# Patient Record
Sex: Female | Born: 1970 | Hispanic: Yes | Marital: Married | Smoking: Never smoker
Health system: Southern US, Community
[De-identification: ages and names within clinical notes are randomized; demographics above are authoritative.]

## PROBLEM LIST (undated history)

## (undated) DIAGNOSIS — E0500 Thyrotoxicosis with diffuse goiter without thyrotoxic crisis or storm: Secondary | ICD-10-CM

## (undated) DIAGNOSIS — H468 Other optic neuritis: Secondary | ICD-10-CM

## (undated) DIAGNOSIS — M3500 Sicca syndrome, unspecified: Secondary | ICD-10-CM

## (undated) DIAGNOSIS — E079 Disorder of thyroid, unspecified: Secondary | ICD-10-CM

## (undated) DIAGNOSIS — B159 Hepatitis A without hepatic coma: Secondary | ICD-10-CM

## (undated) DIAGNOSIS — J302 Other seasonal allergic rhinitis: Secondary | ICD-10-CM

## (undated) DIAGNOSIS — H47099 Other disorders of optic nerve, not elsewhere classified, unspecified eye: Secondary | ICD-10-CM

## (undated) HISTORY — DX: Sjogren syndrome, unspecified: M35.00

## (undated) HISTORY — DX: Hepatitis a without hepatic coma: B15.9

## (undated) HISTORY — DX: Disorder of thyroid, unspecified: E07.9

## (undated) HISTORY — DX: Thyrotoxicosis with diffuse goiter without thyrotoxic crisis or storm: E05.00

## (undated) HISTORY — PX: EYE SURGERY: SHX253

## (undated) HISTORY — DX: Other seasonal allergic rhinitis: J30.2

## (undated) HISTORY — DX: Other disorders of optic nerve, not elsewhere classified, unspecified eye: H47.099

## (undated) HISTORY — DX: Other optic neuritis: H46.8

---

## 2009-02-18 ENCOUNTER — Ambulatory Visit: Payer: Self-pay | Admitting: Gynecology

## 2009-02-18 ENCOUNTER — Encounter: Payer: Self-pay | Admitting: Gynecology

## 2009-02-18 ENCOUNTER — Other Ambulatory Visit: Admission: RE | Admit: 2009-02-18 | Discharge: 2009-02-18 | Payer: Self-pay | Admitting: Gynecology

## 2009-02-25 ENCOUNTER — Ambulatory Visit (HOSPITAL_COMMUNITY): Admission: RE | Admit: 2009-02-25 | Discharge: 2009-02-25 | Payer: Self-pay | Admitting: Gynecology

## 2010-02-19 ENCOUNTER — Other Ambulatory Visit: Admission: RE | Admit: 2010-02-19 | Discharge: 2010-02-19 | Payer: Self-pay | Admitting: Gynecology

## 2010-02-19 ENCOUNTER — Ambulatory Visit: Payer: Self-pay | Admitting: Gynecology

## 2010-02-25 ENCOUNTER — Ambulatory Visit: Payer: Self-pay | Admitting: Gynecology

## 2010-03-05 ENCOUNTER — Encounter: Admission: RE | Admit: 2010-03-05 | Discharge: 2010-03-05 | Payer: Self-pay | Admitting: Gynecology

## 2010-03-16 ENCOUNTER — Encounter: Admission: RE | Admit: 2010-03-16 | Discharge: 2010-03-16 | Payer: Self-pay | Admitting: Gynecology

## 2011-02-10 ENCOUNTER — Encounter: Payer: Self-pay | Admitting: Gynecology

## 2013-10-22 ENCOUNTER — Encounter: Payer: Self-pay | Admitting: Internal Medicine

## 2013-12-30 ENCOUNTER — Ambulatory Visit: Payer: Self-pay | Admitting: Internal Medicine

## 2014-07-14 ENCOUNTER — Other Ambulatory Visit (HOSPITAL_COMMUNITY)
Admission: RE | Admit: 2014-07-14 | Discharge: 2014-07-14 | Disposition: A | Discharge status: Home / Self Care | Payer: 59 | Source: Ambulatory Visit | Attending: Gynecology | Admitting: Gynecology

## 2014-07-14 ENCOUNTER — Ambulatory Visit (INDEPENDENT_AMBULATORY_CARE_PROVIDER_SITE_OTHER): Payer: 59 | Admitting: Gynecology

## 2014-07-14 ENCOUNTER — Encounter: Payer: Self-pay | Admitting: Gynecology

## 2014-07-14 VITALS — BP 120/66 | Ht 58.5 in | Wt 143.8 lb

## 2014-07-14 DIAGNOSIS — N912 Amenorrhea, unspecified: Secondary | ICD-10-CM | POA: Diagnosis not present

## 2014-07-14 DIAGNOSIS — Z01419 Encounter for gynecological examination (general) (routine) without abnormal findings: Secondary | ICD-10-CM | POA: Insufficient documentation

## 2014-07-14 DIAGNOSIS — Z1151 Encounter for screening for human papillomavirus (HPV): Secondary | ICD-10-CM | POA: Insufficient documentation

## 2014-07-14 DIAGNOSIS — R682 Dry mouth, unspecified: Secondary | ICD-10-CM

## 2014-07-14 DIAGNOSIS — R635 Abnormal weight gain: Secondary | ICD-10-CM

## 2014-07-14 DIAGNOSIS — Z124 Encounter for screening for malignant neoplasm of cervix: Secondary | ICD-10-CM

## 2014-07-14 DIAGNOSIS — N951 Menopausal and female climacteric states: Secondary | ICD-10-CM | POA: Diagnosis not present

## 2014-07-14 LAB — PREGNANCY, URINE: Preg Test, Ur: NEGATIVE

## 2014-07-14 NOTE — Patient Instructions (Signed)
Perimenopausia (Perimenopause) La perimenopausia es el momento en que su cuerpo comienza a pasar a la menopausia (sin menstruacin durante 12 meses consecutivos). Es un proceso natural. La perimenopausia puede comenzar entre 2 y 8 aos antes de la menopausia y por lo general tiene una duracin de 1 ao ms pasada la menopausia. Durante este tiempo, los ovarios podran producir un vulo o no. Los ovarios varan su produccin de las hormonas estrgeno y progesterona cada mes. Esto puede causar perodos menstruales irregulares, dificultad para quedar embarazada, hemorragia vaginal entre perodos y sntomas incmodos. CAUSAS  Produccin irregular de las hormonas ovricas estrgeno y progesterona, y no ovular todos los meses.  Otras causas son:  Tumor de la glndula pituitaria.  Enfermedades que afectan los ovarios.  Radioterapia.  Quimioterapia.  Causas desconocidas.  Fumar mucho y abusar del consumo de alcohol puede llevar a que la perimenopausia aparezca antes. SIGNOS Y SNTOMAS   Acaloramiento.  Sudoracin nocturna.  Perodos menstruales irregulares.  Disminucin del deseo sexual.  Sequedad vaginal.  Dolores de cabeza.  Cambios en el estado de nimo.  Depresin.  Problemas de memoria.  Irritabilidad.  Cansancio.  Aumento de peso.  Problemas para quedar embarazada.  Prdida de clulas seas (osteoporosis).  Comienzo de endurecimiento de las arterias (aterosclerosis). DIAGNSTICO  El mdico realizar un diagnstico en funcin de su edad, historial de perodos menstruales y sntomas. Le realizarn un examen fsico para ver si hay algn cambio en su cuerpo, en especial en sus rganos reproductores. Las pruebas hormonales pueden ser o no tiles segn la cantidad de hormonas femeninas que produzca y cundo las produzca. Sin embargo, podrn realizarse otras pruebas hormonales para detectar otros problemas. TRATAMIENTO  En algunos casos, no se necesita tratamiento. La  decisin acerca de qu tratamiento es necesario durante la perimenopausia deber realizarse en conjunto con su mdico segn cmo estn afectando los sntomas a su estilo de vida. Existen varios tratamientos disponibles, como:  Tratar cada sntoma individual con medicamentos especficos para ese sntoma.  Algunos medicamentos herbales pueden ayudar en sntomas especficos.  Psicoterapia.  Terapia grupal. INSTRUCCIONES PARA EL CUIDADO EN EL HOGAR   Controle sus periodos menstruales (cundo ocurren, qu tan abundantes son, cunto tiempo pasa entre perodos, y cunto duran) como tambin sus sntomas y cundo comenzaron.  Tome slo medicamentos de venta libre o recetados, segn las indicaciones del mdico.  Duerma y descanse.  Haga actividad fsica.  Consuma una dieta que contenga calcio (bueno para los huesos) y productos derivados de la soja (actan como estrgenos).  No fume.  Evite las bebidas alcohlicas.  Tome los suplementos vitamnicos segn las indicaciones del mdico. En ciertos casos, puede ser de ayuda tomar vitamina E.  Tome suplementos de calcio y vitamina D para ayudar a prevenir la prdida sea.  En algunos casos la terapia de grupo podr ayudarla.  La acupuntura puede ser de ayuda en ciertos casos. SOLICITE ATENCIN MDICA SI:   Tiene preguntas acerca de sus sntomas.  Necesita ser derivada a un especialista (gineclogo, psiquiatra, o psiclogo). SOLICITE ATENCIN MDICA DE INMEDIATO SI:   Sufre una hemorragia vaginal abundante.  Su perodo menstrual dura ms de 8 das.  Sus perodos son recurrentes cada menos de 21 das.  Tiene hemorragias durante las relaciones sexuales.  Est muy deprimido.  Siente dolor al orinar.  Siente dolor de cabeza intenso.  Tiene problemas de visin. Document Released: 03/28/2005 Document Revised: 01/16/2013 ExitCare Patient Information 2015 [employer], [employer]. This information is not intended to replace advice given to you    by your health care provider. Make sure you discuss any questions you have with your health care provider.  

## 2014-07-14 NOTE — Progress Notes (Addendum)
Cynthia Fox 03/21/1971 [id number]   History:    44 y.o.  for annual gyn exam who has not been seen in the office since 2011. Patient stated that she had been getting [place] from [place] and stop the birth control pill back in November 2015 and has not had a menstrual cycle. Patient last year had a make a workup for midepigastric pains and informed that her liver function tests were elevated and which she return for additional testing she stated that they informed her that everything was fine. She states that when she was younger she had hepatitis. She has been complaining of a dry mouth and sensitive to certain foods when she eats. She denies any past history of any abnormal Pap smear. She's had mild vasomotor symptoms.  Past medical history,surgical history, family history and social history were all reviewed and documented in the EPIC chart.  Gynecologic History No LMP recorded (approximate). Patient is not currently having periods (Reason: Other). Contraception: none Last Pap: 2011. Results were: normal Last mammogram: 2011. Results were: normal  Obstetric History OB History  Gravida Para Term Preterm AB SAB TAB Ectopic Multiple Living  0 0 0 0 0 0 0 0 0 0          ROS: A ROS was performed and pertinent positives and negatives are included in the history.  GENERAL: No fevers or chills. HEENT: No change in vision, no earache, sore throat or sinus congestion. NECK: No pain or stiffness. CARDIOVASCULAR: No chest pain or pressure. No palpitations. PULMONARY: No shortness of breath, cough or wheeze. GASTROINTESTINAL: No abdominal pain, nausea, vomiting or diarrhea, melena or bright red blood per rectum. GENITOURINARY: No urinary frequency, urgency, hesitancy or dysuria. MUSCULOSKELETAL: No joint or muscle pain, no back pain, no recent trauma. DERMATOLOGIC: No rash, no itching, no lesions. ENDOCRINE: No polyuria, polydipsia, no heat or cold intolerance. No recent change in  weight. HEMATOLOGICAL: No anemia or easy bruising or bleeding. NEUROLOGIC: No headache, seizures, numbness, tingling or weakness. PSYCHIATRIC: No depression, no loss of interest in normal activity or change in sleep pattern.     Exam: chaperone present  BP 120/66 mmHg  Ht 4' 10.5" (1.486 m)  Wt 143 lb 12.8 oz (65.227 kg)  BMI 29.54 kg/m2  LMP  (Approximate)  Body mass index is 29.54 kg/(m^2).  General appearance : Well developed well nourished female. No acute distress HEENT: Eyes: no retinal hemorrhage or exudates,  Neck supple, trachea midline, no carotid bruits, no thyroidmegaly Lungs: Clear to auscultation, no rhonchi or wheezes, or rib retractions  Heart: Regular rate and rhythm, no murmurs or gallops Breast:Examined in sitting and supine position were symmetrical in appearance, no palpable masses or tenderness,  no skin retraction, no nipple inversion, no nipple discharge, no skin discoloration, no axillary or supraclavicular lymphadenopathy Abdomen: no palpable masses or tenderness, no rebound or guarding Extremities: no edema or skin discoloration or tenderness  Pelvic:  Bartholin, Urethra, Skene Glands: Within normal limits             Vagina: No gross lesions or discharge  Cervix: No gross lesions or discharge  Uterus  anteverted, normal size, shape and consistency, non-tender and mobile  Adnexa  Without masses or tenderness  Anus and perineum  normal   Rectovaginal  normal sphincter tone without palpated masses or tenderness             Hemoccult not indicated   Urine pregnancy test negative   Assessment/Plan:  44 y.o. female for annual exam complaining of dry mouth. Minimal joint discomfort. We are going to check and anti-nuclear antibody and if elevated we'll refer her to a rheumatologist in the event of underlying collagen vascular disease. She will return tomorrow in a fasting state TAB the following screening lab tests: Comprehensive metabolic panel, fasting lipid  profile, TSH, CBC, and urinalysis. We are also going to check an [facility] because she has not had a menstrual cycle since November to rule out menopause and also will check a prolactin level because of her amenorrhea as well to complete evaluation. A Pap smear with HPV screening was done today. Patient was provided with a requisition to schedule a mammogram. She will return back next week to discuss the results and plan a course of management.   Ok Edwards MD, 4:53 PM 07/14/2014

## 2014-07-15 LAB — URINALYSIS W MICROSCOPIC + REFLEX CULTURE
BILIRUBIN URINE: NEGATIVE
CRYSTALS: NONE SEEN
Casts: NONE SEEN
Glucose, UA: NEGATIVE mg/dL
HGB URINE DIPSTICK: NEGATIVE
KETONES UR: NEGATIVE mg/dL
Nitrite: NEGATIVE
PROTEIN: NEGATIVE mg/dL
Squamous Epithelial / LPF: NONE SEEN
UROBILINOGEN UA: 0.2 mg/dL (ref 0.0–1.0)
pH: 6 (ref 5.0–8.0)

## 2014-07-16 ENCOUNTER — Other Ambulatory Visit: Payer: 59

## 2014-07-16 LAB — CBC WITH DIFFERENTIAL/PLATELET
Basophils Absolute: 0 K/uL (ref 0.0–0.1)
Basophils Relative: 0 % (ref 0–1)
Eosinophils Absolute: 0.3 K/uL (ref 0.0–0.7)
Eosinophils Relative: 4 % (ref 0–5)
HCT: 40.1 % (ref 36.0–46.0)
Hemoglobin: 13.4 g/dL (ref 12.0–15.0)
Lymphocytes Relative: 20 % (ref 12–46)
Lymphs Abs: 1.4 K/uL (ref 0.7–4.0)
MCH: 33.4 pg (ref 26.0–34.0)
MCHC: 33.4 g/dL (ref 30.0–36.0)
MCV: 100 fL (ref 78.0–100.0)
MPV: 11.1 fL (ref 8.6–12.4)
Monocytes Absolute: 0.5 K/uL (ref 0.1–1.0)
Monocytes Relative: 7 % (ref 3–12)
Neutro Abs: 4.8 K/uL (ref 1.7–7.7)
Neutrophils Relative %: 69 % (ref 43–77)
Platelets: 308 K/uL (ref 150–400)
RBC: 4.01 MIL/uL (ref 3.87–5.11)
RDW: 13.2 % (ref 11.5–15.5)
WBC: 7 K/uL (ref 4.0–10.5)

## 2014-07-16 LAB — LIPID PANEL
Cholesterol: 169 mg/dL (ref 0–200)
HDL: 48 mg/dL
LDL Cholesterol: 106 mg/dL — ABNORMAL HIGH (ref 0–99)
Total CHOL/HDL Ratio: 3.5 ratio
Triglycerides: 75 mg/dL
VLDL: 15 mg/dL (ref 0–40)

## 2014-07-16 LAB — COMPREHENSIVE METABOLIC PANEL
ALBUMIN: 4 g/dL (ref 3.5–5.2)
ALT: 28 U/L (ref 0–35)
AST: 26 U/L (ref 0–37)
Alkaline Phosphatase: 111 U/L (ref 39–117)
BUN: 13 mg/dL (ref 6–23)
CO2: 24 meq/L (ref 19–32)
Calcium: 8.9 mg/dL (ref 8.4–10.5)
Chloride: 102 mEq/L (ref 96–112)
Creat: 0.48 mg/dL — ABNORMAL LOW (ref 0.50–1.10)
GLUCOSE: 92 mg/dL (ref 70–99)
POTASSIUM: 4.1 meq/L (ref 3.5–5.3)
SODIUM: 135 meq/L (ref 135–145)
TOTAL PROTEIN: 7.3 g/dL (ref 6.0–8.3)
Total Bilirubin: 0.7 mg/dL (ref 0.2–1.2)

## 2014-07-16 LAB — URINE CULTURE

## 2014-07-16 LAB — TSH: TSH: 0.009 u[IU]/mL — ABNORMAL LOW (ref 0.350–4.500)

## 2014-07-16 LAB — CYTOLOGY - PAP

## 2014-07-17 LAB — FOLLICLE STIMULATING HORMONE: FSH: 3 m[IU]/mL

## 2014-07-17 LAB — PROLACTIN: PROLACTIN: 40.3 ng/mL

## 2014-07-21 ENCOUNTER — Other Ambulatory Visit: Payer: Self-pay | Admitting: Gynecology

## 2014-07-21 DIAGNOSIS — R7989 Other specified abnormal findings of blood chemistry: Secondary | ICD-10-CM

## 2014-07-21 LAB — ANA: ANA: POSITIVE — AB

## 2014-07-21 LAB — ANTI-NUCLEAR AB-TITER (ANA TITER): ANA Titer 1: 1:160 {titer} — ABNORMAL HIGH

## 2014-07-22 ENCOUNTER — Other Ambulatory Visit: Payer: 59

## 2014-07-22 ENCOUNTER — Telehealth: Payer: Self-pay | Admitting: *Deleted

## 2014-07-22 ENCOUNTER — Other Ambulatory Visit: Payer: Self-pay | Admitting: Gynecology

## 2014-07-22 DIAGNOSIS — E229 Hyperfunction of pituitary gland, unspecified: Principal | ICD-10-CM

## 2014-07-22 DIAGNOSIS — R7989 Other specified abnormal findings of blood chemistry: Secondary | ICD-10-CM

## 2014-07-22 NOTE — Telephone Encounter (Signed)
Referral faxed to [facility] they will contact me with time and date

## 2014-07-22 NOTE — Telephone Encounter (Signed)
-----   Message from Keenan BachelorKatherine R Annas, ArizonaRMA sent at 07/21/2014 12:30 PM EDT ----- Regarding: Rheumatolgy referral Per Dr. Glenetta HewJF "Also her ANA was elevated and we need to make an appointment for her with Dr. Corliss Skainseveshwar rheumatologist because of her symptoms for further evaluation."  Patient informed. She knows it will be sometime this week you will send referral.

## 2014-07-23 ENCOUNTER — Encounter: Payer: Self-pay | Admitting: Gynecology

## 2014-07-23 ENCOUNTER — Ambulatory Visit (INDEPENDENT_AMBULATORY_CARE_PROVIDER_SITE_OTHER): Payer: 59 | Admitting: Gynecology

## 2014-07-23 VITALS — BP 126/78

## 2014-07-23 DIAGNOSIS — R768 Other specified abnormal immunological findings in serum: Secondary | ICD-10-CM | POA: Diagnosis not present

## 2014-07-23 DIAGNOSIS — N915 Oligomenorrhea, unspecified: Secondary | ICD-10-CM

## 2014-07-23 DIAGNOSIS — E0590 Thyrotoxicosis, unspecified without thyrotoxic crisis or storm: Secondary | ICD-10-CM | POA: Diagnosis not present

## 2014-07-23 LAB — THYROID PANEL WITH TSH
FREE THYROXINE INDEX: 2.5 (ref 1.4–3.8)
T3 UPTAKE: 28 % (ref 22–35)
T4, Total: 8.8 ug/dL (ref 4.5–12.0)
TSH: 0.009 u[IU]/mL — AB (ref 0.350–4.500)

## 2014-07-23 LAB — PROLACTIN: PROLACTIN: 39.9 ng/mL

## 2014-07-23 MED ORDER — LEVONORGESTREL-ETHINYL ESTRAD 0.1-20 MG-MCG PO TABS: 1.0000 | ORAL_TABLET | Freq: Every day | ORAL | Status: DC

## 2014-07-23 NOTE — Patient Instructions (Addendum)
Hipertiroidismo (Hyperthyroidism) La tiroides es una glndula grande ubicada en la parte anterior e inferior del cuello. La tiroides interviene [employer] control del metabolismo. El metabolismo es el modo en que el organismo utiliza los alimentos. El control del metabolismo se realiza a travs de una hormona denominada tiroxina. Cuando la tiroides es hiperactiva, produce demasiada hormona. Cuando esto ocurre pueden surgir los siguientes problemas:   Nerviosismo  Intolerancia al calor  Prdida de peso (a pesar del aumento de la ingesta de comida)  Diarrea  Cambios en la textura del cabello o de la piel  Palpitaciones (falta de algunos latidos cardacos o latidos extra)  Taquicardia (frecuencia cardaca acelerada)  Falta de menstruacin (amenorrea)  Temblor en las manos CAUSAS  Enfermedad de [place] (el sistema inmune ataca a la glndula tiroides). sta es la causa ms frecuente.  Inflamacin de la glndula tiroides.  Tumor (generalmente benigno) de la glndula tiroides o Scientist, water quality.  Uso excesivo de medicamentos para la tiroides (tanto prescriptos como 'naturales')  Ingesta excesiva de iodo. DIAGNSTICO Para confirmar el hipertiroidismo, el profesional que lo asiste le solicitar anlisis de sangre y estudios con [place]. Algunas veces los signos estn ocultos. Puede ser [employer] profesional controle la enfermedad con exmenes de [place], ya sea antes o despus del diagnstico y [place]. TRATAMIENTO Tratamiento a Web designer varios tratamientos para Risk manager. Los medicamentos beta bloqueantes podrn proporcionar cierto alivio. Los medicamentos que disminuyen la produccin de hormonas proporcionarn a Neurosurgeon temporal Estas medidas en general no ofrecen Architect. Tratamiento definitivo  Se dispone de varios tratamientos que el profesional que lo asiste podr Agricultural engineer con usted y que tratarn el problema de  [place]. Estos tratamientos varan desde la ciruga (extirpacin de la tiroides) o el uso de yodo radiactivo (que destruye la tiroides por radiacin), hasta el uso de medicamentos antitiroideos (que interfieren en la sntesis de la hormona). Los dos primeros tratamientos son permanentes y Soil scientist. Habitualmente requieren el suministro de hormona de por vida. Esto es debido a que es imposible retirar o destruir la cantidad Secondary school teacher de tiroides que se necesita para que la persona quede eutiroidea (normal). INSTRUCCIONES PARA EL CUIDADO DOMICILIARIO Consulte con el profesional que lo asiste si el problema por el que lo trata empeora. Algunos ejemplos seran los trastornos ya mencionados. SOLICITE ATENCIN MDICA SI: El trastorno general empeora. EST SEGURO QUE:   Comprende las instrucciones para el alta mdica.  Controlar su enfermedad.  Solicitar atencin mdica de inmediato segn las indicaciones. Document Released: 03/28/2005 Document Revised: 06/20/2011 [facility] Patient Information 2015 [place], [place]. This information is not intended to replace advice given to you by your health care provider. Make sure you discuss any questions you have with your health care provider. AAN, anlisis de anticuerpos antinucleares (ANA, Antinuclear Antibody Test) Este anlisis sirve para diagnosticar el lupus eritematoso sistmico (LES) y el lupus inducido por frmacos y para descartar otras enfermedades similares (autoinmunes). El anlisis de AAN detecta anticuerpos antinucleares (AAN) en la sangre.  A veces, el sistema inmunolgico no funciona adecuadamente y produce sustancias que atacan las clulas del mismo organismo en lugar de atacar las sustancias extraas. Estas sustancias, denominadas autoanticuerpos, son producidas por Copywriter, advertising inmunolgico. Este es el sistema de defensa del organismo contra la invasin de las sustancias extraas, como los virus y las bacterias. Cuando esto  sucede, puede provocar una enfermedad que se llama enfermedad autoinmune (inmunidad contra s mismo). La presencia de AAN es  un marcador de un proceso autoinmune y est asociada con varias enfermedades de este tipo, pero lo ms frecuente es su relacin con el lupus eritematoso sistmico (LES). PREPARACIN PARA EL ESTUDIO No se requiere una preparacin especial ni ayunar. Le tomarn una muestra de [place] de una vena con Marella Bileuna aguja. HALLAZGOS NORMALES  Adultos: Negativo en una dilucin 1:40. Los rangos para los resultados normales pueden variar entre diferentes laboratorios y hospitales. Consulte siempre con su mdico despus de Production assistant, radiohacer el estudio para Artistconocer el significado de los [place]resultados y si los valores se consideran "dentro de los lmites normales". SIGNIFICADO DEL ESTUDIO  El mdico leer los resultados y Heritage managerhablar con usted sobre la importancia y el significado de los [place]resultados, as como las opciones de tratamiento y la necesidad de Education officer, environmentalrealizar pruebas adicionales, si fuera necesario. OBTENCIN DE LOS RESULTADOS DE LAS PRUEBAS Es su responsabilidad retirar el resultado del Vanderbiltestudio. Consulte en el laboratorio cundo y cmo podr Starbucks Corporationobtener los resultados. Document Released: 01/16/2013 [facility] Patient Information 2015 [place], [place]. This information is not intended to replace advice given to you by your health care provider. Make sure you discuss any questions you have with your health care provider.

## 2014-07-23 NOTE — Progress Notes (Signed)
   Patient presented to the office today to discuss her lab results. She was seen in the office for her annual exam on 07/14/2014 she had not been seen the office prior to that since 2011.Patient stated that she had been getting her oral contraceptive pills from [place] and stop the birth control pill back in November 2015 and has not had a menstrual cycle until a few days ago which she's had very light spotting. She had informed me that last year she had a workup as a result of midepigastric pains that she was having and was told that she had elevated liver function tests and when she returned to have additional testing the liver function tests were back to normal. She also had informed me that when she was young she had hepatitis A. She was also complaining of dry mouth and sensitivity to certain foods when she eats. She denied any photosensitivity or any malar rash or joint pains. She has been using barrier contraception at times since November of last year.  I had discussed with her that possibly she could've been in the perimenopausal phase of her life or thyroid dysfunction and or collagen vascular disease. So the following blood tests were ordered:  Comprehensive metabolic panel: Normal with exception slightly decreased creatinine Lipid profile: Normal parameters except LDL slightly elevated at 106 CBC: Within normal limits FSH: Normal Prolactin: Elevated at 40.3 on repeat one week later 39.9 TSH: Low with a value of 0.009 and on repeat 0.009 with normal T4, T3 and FTI ANA: Positive with 1 in 160 titer  Exam: Blood pressure 126/78   pulse 72 Neck: No thyromegaly no carotid bruits no palpable masses  Assessment/plan #1 Patient with apparent subclinical hyperthyroidism which indirectly may be contributing to her hyperprolactinemia. Patient with no galactorrhea no unusual headaches or any visual disturbances. We'll referred to endocrinologist for assistance in management. A thyroid scan will be  ordered before the appointment with endocrinologist #2 she will later be referred to the rheumatologist as a result of her elevated ANA and dry mouth to rule out collagen vascular disease. #3 patient was instructed to use barrier contraception and to return next week for placement of a nonhormonal IUD. Because of her past history of hepatitis A and last year elevated liver function tests I would recommend she not be place on any hormone-containing products at this time. All the above instructions were provided in Spanish greater than 50% of the time was spent in counseling and correlation of care for this patient proximal length 30 minutes.

## 2014-07-24 ENCOUNTER — Telehealth: Payer: Self-pay | Admitting: Gynecology

## 2014-07-24 ENCOUNTER — Telehealth: Payer: Self-pay | Admitting: *Deleted

## 2014-07-24 DIAGNOSIS — E0590 Thyrotoxicosis, unspecified without thyrotoxic crisis or storm: Secondary | ICD-10-CM

## 2014-07-24 NOTE — Telephone Encounter (Signed)
07/24/14- I LM VM for pt that [facility] the Paraguard IUD and insertion at 100% for contraception.wl

## 2014-07-24 NOTE — Telephone Encounter (Addendum)
Appointment for scan is on 08/07/14 and 08/08/14 [facility] at 1st floor radiology at [address]. Pt informed with this. Dr.Gherge office will call to schedule the consult visit. Pt aware of this as well. Per JF other staff message "Victorino DikeJennifer on the thyroid scan ordered please call them and tell them we need an "uptake and Scan" of thyroid gland. Thanks JF " this is the exam scheduled.

## 2014-07-24 NOTE — Telephone Encounter (Signed)
-----   Message from Ok EdwardsJuan H Fernandez, MD sent at 07/23/2014  4:09 PM EDT ----- Cynthia DikeJennifer please make schedule thyroid ultrasound and a consult appointment with Dr. Lafe GarinGherge Endocrinologist at [facility]. Diagnosis: Subclinical hyperthyroidism

## 2014-08-05 ENCOUNTER — Encounter: Payer: Self-pay | Admitting: Gynecology

## 2014-08-05 NOTE — Telephone Encounter (Signed)
I spoke with Cynthia Fox regarding this referral and was told to re-fax this this was done on 08/04/14

## 2014-08-07 ENCOUNTER — Encounter (HOSPITAL_COMMUNITY)
Admission: RE | Admit: 2014-08-07 | Discharge: 2014-08-07 | Disposition: A | Discharge status: Home / Self Care | Payer: 59 | Source: Ambulatory Visit | Attending: Gynecology | Admitting: Gynecology

## 2014-08-07 DIAGNOSIS — E0590 Thyrotoxicosis, unspecified without thyrotoxic crisis or storm: Secondary | ICD-10-CM

## 2014-08-07 MED ORDER — SODIUM IODIDE I 131 CAPSULE
12.0000 | Freq: Once | INTRAVENOUS | Status: AC | PRN
  Administered 2014-08-07: 12 via ORAL

## 2014-08-08 ENCOUNTER — Encounter: Payer: Self-pay | Admitting: Endocrinology

## 2014-08-08 ENCOUNTER — Encounter (HOSPITAL_COMMUNITY)
Admission: RE | Admit: 2014-08-08 | Discharge: 2014-08-08 | Disposition: A | Discharge status: Home / Self Care | Payer: 59 | Source: Ambulatory Visit | Attending: Gynecology | Admitting: Gynecology

## 2014-08-08 DIAGNOSIS — E0590 Thyrotoxicosis, unspecified without thyrotoxic crisis or storm: Secondary | ICD-10-CM | POA: Diagnosis not present

## 2014-08-08 MED ORDER — SODIUM IODIDE I 131 CAPSULE: 0.1500 | Freq: Once | INTRAVENOUS | Status: AC | PRN

## 2014-08-12 ENCOUNTER — Telehealth: Payer: Self-pay | Admitting: *Deleted

## 2014-08-12 DIAGNOSIS — E0590 Thyrotoxicosis, unspecified without thyrotoxic crisis or storm: Secondary | ICD-10-CM

## 2014-08-12 NOTE — Telephone Encounter (Signed)
Referral was note placed for the below, referral placed now, they will contact pt to schedule.

## 2014-08-12 NOTE — Telephone Encounter (Signed)
Spoke with Burna MortimerWanda today and the MD has not yet reviewed pt notes yet. Will check back next week, if MD will see the patient they will contact her and me with time and date, will check back next week

## 2014-08-12 NOTE — Telephone Encounter (Signed)
-----   Message from Keenan BachelorKatherine R Annas, ArizonaRMA sent at 08/08/2014  3:28 PM EDT ----- Regarding: referral needs to be put in queue Dr. Glenetta HewJF received thyroid scan results and sent me note to be sure she has appointment with Dr. Lafe GarinGherge. I saw your 07/24/14 note and it said Dr. Lavella HammockGherge's office will call her with consult. I called her office but they did not know about it. They said she will need to put referral request in the queue for them and then they will contact patient to schedule. Thank you!!!

## 2014-08-13 NOTE — Telephone Encounter (Signed)
Appointment 09/11/14 @ 1:15pm

## 2014-08-18 NOTE — Telephone Encounter (Signed)
Pt had appointment 08/15/14

## 2014-09-11 ENCOUNTER — Ambulatory Visit (INDEPENDENT_AMBULATORY_CARE_PROVIDER_SITE_OTHER): Payer: 59 | Admitting: Internal Medicine

## 2014-09-11 ENCOUNTER — Encounter: Payer: Self-pay | Admitting: Internal Medicine

## 2014-09-11 VITALS — BP 106/68 | HR 84 | Temp 98.3°F | Resp 12 | Ht 59.0 in | Wt 149.0 lb

## 2014-09-11 DIAGNOSIS — E0590 Thyrotoxicosis, unspecified without thyrotoxic crisis or storm: Secondary | ICD-10-CM | POA: Diagnosis not present

## 2014-09-11 DIAGNOSIS — E221 Hyperprolactinemia: Secondary | ICD-10-CM

## 2014-09-11 NOTE — Progress Notes (Addendum)
Patient ID: Cynthia Fox, female   DOB: 01-12-1971, 44 y.o.   MRN: [id number]   HPI  Cynthia Fox is a 44 y.o.-year-old female, referred by Dr Lily Peer, for management of low TSH and elevated prolactin.  She was found to have a low TSH in 07/2014 by Dr Lily Peer.  I reviewed pt's thyroid tests: Lab Results  Component Value Date   TSH 0.009* 07/22/2014   TSH 0.009* 07/16/2014    Latest thyroid profile: Component     Latest Ref Rng 07/22/2014  T4, Total     4.5 - 12.0 ug/dL 8.8  T3 Uptake     22 - 35 % 28  Free Thyroxine Index     1.4 - 3.8 2.5  TSH     0.350 - 4.500 uIU/mL 0.009 (L)   Pt had a thyroid uptake in 08/08/2014 (unfortunately, a scan was not done at that time) and this returned at ULN, 28.4% (10-30%).  Pt denies feeling nodules in neck, hoarseness, dysphagia/odynophagia, SOB with lying down.  She c/o: - + dry mouth and dry eyes (is under investigation for Sjogren sd.)  - no fatigue - no excessive sweating/heat intolerance now, but had night sweats during the last winter - no tremors - no anxiety - + occasional, short-lived, palpitations - no hyperdefecation - + weight loss (13 lbs last year); + weight gain (gained 10 lbs this year) - no hair loss - + itching and dry skin lateral forearms and thighs - + irreg menses: skipped 5 months  Pt does have a FH of thyroid ds.: mother, sisters, aunts, cousins No FH of thyroid cancer. No h/o radiation tx to head or neck.  No seaweed or kelp, no recent contrast studies. No steroid use. No herbal supplements. No Biotin use.  Pt also has an elevated prolactin (normal: 2.8-29.2): Component     Latest Ref Rng 07/16/2014 07/22/2014  FSH      3.0   Prolactin      40.3 39.9   No milk discharge. No breast fulness. + HAs, less than before. No vision pbs.   I reviewed her chart and she also has a history of vit D deficiency. She had elevated LFTs in the past, resolved.   ROS: Constitutional: + weight gain, no fatigue, no  subjective hyperthermia/hypothermia Eyes: no blurry vision, no xerophthalmia ENT: no sore throat, no nodules palpated in throat, no dysphagia/odynophagia, no hoarseness Cardiovascular: no CP/SOB/palpitations/+ leg swelling Respiratory: no cough/SOB Gastrointestinal: no N/V/D/C Musculoskeletal: no muscle/joint aches Skin: no rashes, + itching, dry mouth Neurological: no tremors/numbness/tingling/dizziness, + HA Psychiatric: no depression/anxiety + Low libido  Past Medical History  Diagnosis Date  . Hepatitis A   . GERD (gastroesophageal reflux disease)   . Oral candidiasis   . Seasonal allergies    No past surgical history on file.   History   Social History  . Marital Status: Married    Spouse Name: N/A  . 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 Smoking status: Never Smoker   . Smokeless tobacco: Never Used  . Alcohol Use: Yes     Comment: weekends   Current Outpatient Prescriptions on File Prior to Visit  Medication Sig Dispense Refill  . Cetirizine HCl 10 MG CAPS Take by mouth.    . levonorgestrel-ethinyl estradiol (AVIANE,ALESSE,LESSINA) 0.1-20 MG-MCG tablet Take 1 tablet by mouth daily. (Patient not taking: Reported on 09/11/2014) 1 Package 11   No  current facility-administered medications on file prior to visit.   No Known Allergies   Family History  Problem Relation Age of Onset  . Hypertension Mother   . Diabetes Father   - also see history of present illness  PE: BP 106/68 mmHg  Pulse 84  Temp(Src) 98.3 F (36.8 C) (Oral)  Resp 12  Ht 4\' 11"  (1.499 m)  Wt 149 lb (67.586 kg)  BMI 30.08 kg/m2  SpO2 97%  LMP  Wt Readings from Last 3 Encounters:  09/11/14 149 lb (67.586 kg)  07/14/14 143 lb 12.8 oz (65.227 kg)   Constitutional: overweight, in NAD Eyes: PERRLA, EOMI, no exophthalmos, no lid lag, no stare ENT: moist mucous membranes, no thyromegaly, no thyroid bruits, no cervical  lymphadenopathy Cardiovascular: RRR, No MRG Respiratory: CTA B Gastrointestinal: abdomen soft, NT, ND, BS+ Musculoskeletal: no deformities, strength intact in all 4 Skin: moist, warm, no rashes Neurological: no tremor with outstretched hands, DTR normal in all 4  ASSESSMENT: 1. Low TSH  2. Elevated prolactin  PLAN:  1. Patient with a recently found low TSH, without thyrotoxic sxs: no weight loss, hyperdefecation, palpitations, anxiety. She did have night sweats during the winter, but now resolved - she does not appear to have exogenous causes for the low TSH. She is on hair skin and nails vitamins, but she only started these 2 weeks ago. - We discussed that possible causes of thyrotoxicosis are:  Thyroiditis -   ruled out by a normal thyroid uptake Graves ds toxic multinodular goiter/ toxic adenoma (I cannot feel nodules at palpation of her thyroid). - I suggested that we check the TSH, fT3 and fT4 and also add thyroid stimulating antibodies to screen for Graves' disease.  - If the tests remain abnormal, we may need an thyroid NM scan to see if she does not have one or more toxic nodules - we might need to do thyroid ultrasound depending on the results of the scan (if a cold nodule is present) - I do not feel that we need to add beta blockers at this time, since she is not tachycardic, anxious, or tremulous - I advised her to join my chart to communicate easier - RTC in 4 months, but likely sooner for repeat labs  2. Elevated PRL Patient with persistent high prolactin levels, and without evidence of a pituitary tumor.  -Possible etiologies of high prolactin levels:  Pregnancy or lactation  Hypothyroidism   Stress   Exercise   Lack of sleep   Medications (she's not taking psychotropic medications or Reglan)  Drugs (denies)  Chest wall lesions (denies)  Seizures (denies)  Liver ds (no)  Kidney disease (no)  A teratoma containing pituitary cells that can develop into  prolactinoma (very rarely)  Macroprolactin (multimeric prolactin, especially since patient does not have galactorrhea)  idiopathic - high prolactin most frequently impacts menstrual cycles. She had 5 missed cycles, but she is close to menopausal age. Her FSH, however, was not high.  - we will check monomeric and total prolactin along with TFTs, however, she is on hair, skin and nails vitamins, and I advised her to skip these the day of coming back for labs, since this contains biotin, which can lower the TSH. - She is asymptomatic from the high prolactin, so if this is stable in the future, she may not need treatment.  - time spent with the patient: 1 hour, of which >50% was spent in obtaining information about her symptoms, reviewing her previous labs, evaluations,  and treatments, counseling her about her condition (please see the discussed topics above), and developing a plan to further investigate it; she had a number of questions which I addressed.  Component     Latest Ref Rng 09/16/2014  TSH     0.35 - 4.50 uIU/mL 0.09 (L)  Free T4     0.60 - 1.60 ng/dL 9.62  T3, Free     2.3 - 4.2 pg/mL 4.6 (H)   TSH improved, but free T3 high. I would assume that she has mild Graves' disease. I will recommend to start a low-dose methimazole, 5 mg daily and recheck her thyroid tests in 6 weeks.   Component     Latest Ref Rng 09/16/2014  Prolactin, Total      28.0  Prolactin, Monomeric     3.2 - 25.2 ng/mL 23.0  TSI     <140 % baseline 297 (H)   Prolactin level normal. No further investigation needed for now, but will keep an eye on it. TSI elevated, consistent with mild Graves' disease.

## 2014-09-11 NOTE — Patient Instructions (Addendum)
Please stop at the lab.  I will let you know the results and further plan as soon as they are available.  Please come back for a follow-up appointment in 4 months.

## 2014-09-16 ENCOUNTER — Other Ambulatory Visit (INDEPENDENT_AMBULATORY_CARE_PROVIDER_SITE_OTHER): Payer: 59

## 2014-09-16 DIAGNOSIS — E0590 Thyrotoxicosis, unspecified without thyrotoxic crisis or storm: Secondary | ICD-10-CM | POA: Diagnosis not present

## 2014-09-16 DIAGNOSIS — E221 Hyperprolactinemia: Secondary | ICD-10-CM

## 2014-09-16 LAB — T4, FREE: Free T4: 1.1 ng/dL (ref 0.60–1.60)

## 2014-09-16 LAB — TSH: TSH: 0.09 u[IU]/mL — AB (ref 0.35–4.50)

## 2014-09-16 LAB — T3, FREE: T3, Free: 4.6 pg/mL — ABNORMAL HIGH (ref 2.3–4.2)

## 2014-09-19 LAB — THYROID STIMULATING IMMUNOGLOBULIN: TSI: 297 %{baseline} — AB (ref <140)

## 2014-09-19 MED ORDER — METHIMAZOLE 5 MG PO TABS: 5.0000 mg | ORAL_TABLET | Freq: Every day | ORAL | Status: DC

## 2014-09-24 LAB — PROLACTIN, TOTAL AND MONOMERIC
Prolactin, Monomeric: 23 ng/mL (ref 3.2–25.2)
Prolactin, Total: 28 ng/mL

## 2015-01-07 ENCOUNTER — Other Ambulatory Visit: Payer: Self-pay | Admitting: Gynecology

## 2015-01-07 DIAGNOSIS — Z1231 Encounter for screening mammogram for malignant neoplasm of breast: Secondary | ICD-10-CM

## 2015-01-12 ENCOUNTER — Ambulatory Visit (INDEPENDENT_AMBULATORY_CARE_PROVIDER_SITE_OTHER): Payer: 59 | Admitting: Internal Medicine

## 2015-01-12 ENCOUNTER — Encounter: Payer: Self-pay | Admitting: Internal Medicine

## 2015-01-12 ENCOUNTER — Other Ambulatory Visit (INDEPENDENT_AMBULATORY_CARE_PROVIDER_SITE_OTHER): Payer: 59

## 2015-01-12 VITALS — BP 118/68 | HR 98 | Temp 98.6°F | Ht 60.0 in | Wt 131.0 lb

## 2015-01-12 DIAGNOSIS — E0590 Thyrotoxicosis, unspecified without thyrotoxic crisis or storm: Secondary | ICD-10-CM

## 2015-01-12 DIAGNOSIS — E221 Hyperprolactinemia: Secondary | ICD-10-CM | POA: Diagnosis not present

## 2015-01-12 NOTE — Patient Instructions (Signed)
Continue Methimazole 5 mg daily for now.  Stop at [employer].  Please come back for a follow-up appointment in 6 months.

## 2015-01-12 NOTE — Progress Notes (Signed)
Patient ID: Cynthia Fox, female   DOB: Jul 04, 1970, 44 y.o.   MRN: [id number]   HPI  Cynthia Fox is a 44 y.o.-year-old female, referred by Dr Lily Peer, for management of low TSH and elevated prolactin.  She was found to have a low TSH in 07/2014 by Dr Lily Peer.  I reviewed pt's thyroid tests: Lab Results  Component Value Date   TSH 0.09* 09/16/2014   TSH 0.009* 07/22/2014   TSH 0.009* 07/16/2014   FREET4 1.10 09/16/2014    Full thyroid profiles:  Component     Latest Ref Rng 07/22/2014  T4, Total     4.5 - 12.0 ug/dL 8.8  T3 Uptake     22 - 35 % 28  Free Thyroxine Index     1.4 - 3.8 2.5  TSH     0.350 - 4.500 uIU/mL 0.009 (L)   Component     Latest Ref Rng 09/16/2014  TSH     0.35 - 4.50 uIU/mL 0.09 (L)  Free T4     0.60 - 1.60 ng/dL 1.19  T3, Free     2.3 - 4.2 pg/mL 4.6 (H)   TSI     <140 % baseline 297 (H)  TSH improved, but free T3 high. TSI elevated, consistent with mild Graves' disease.  I recommended to start a low-dose methimazole, 5 mg daily and recheck her thyroid tests in 6 weeks - but did not return for labs.  Pt had a thyroid uptake in 08/08/2014 (unfortunately, a scan was not done at that time) and this returned at ULN, 28.4% (10-30%).  Pt denies feeling nodules in neck, hoarseness, dysphagia/odynophagia, SOB with lying down.  She c/o: - + hot flushes - + weight loss (18 lbs in last 4 months) - no fatigue - no excessive sweating/heat intolerance now, but had night sweats during the last winter - no tremors - no anxiety - + occasional, short-lived, palpitations - no hyperdefecation - no hair loss - now menstrual cycles normal  Pt also has a h/o an elevated prolactin (normal: 2.8-29.2): Component     Latest Ref Rng 07/16/2014 07/22/2014  FSH      3.0   Prolactin      40.3 39.9   Prolactin level normal at last check: Component     Latest Ref Rng 09/16/2014  Prolactin, Total      28.0  Prolactin, Monomeric     3.2 - 25.2 ng/mL 23.0   No  milk discharge. No breast fulness. No vision pbs.   She also has a history of vit D deficiency. She had elevated LFTs in the past, resolved.   ROS: Constitutional: + weight loss, no fatigue, no subjective hyperthermia/hypothermia Eyes: no blurry vision, no xerophthalmia ENT: no sore throat, no nodules palpated in throat, no dysphagia/odynophagia, no hoarseness Cardiovascular: no CP/SOB/+ palpitations/no leg swelling Respiratory: no cough/SOB Gastrointestinal: no N/V/D/C Musculoskeletal: no muscle/joint aches Skin: no rashes, + itching Neurological: no tremors/numbness/tingling/dizziness + Low libido  I reviewed pt's medications, allergies, PMH, social hx, family hx, and changes were documented in the history of present illness. Otherwise, unchanged from my initial visit note.  Past Medical History  Diagnosis Date  . Hepatitis A   . GERD (gastroesophageal reflux disease)   . Oral candidiasis   . Seasonal allergies    No past surgical history on file.   History   Social History  . Marital Status: Married    Spouse Name: N/A  . Number of Children: no   Occupational  History  .  shipping clerk    Social History Main Topics  . Smoking status: Never Smoker   . Smokeless tobacco: Never Used  . Alcohol Use: Yes     Comment: weekends   Current Outpatient Prescriptions on File Prior to Visit  Medication Sig Dispense Refill  . methimazole (TAPAZOLE) 5 MG tablet Take 1 tablet (5 mg total) by mouth daily. 60 tablet 1  . Multiple Vitamins-Minerals (HAIR/SKIN/NAILS PO) Take 1 capsule by mouth daily.    . Vitamin D, Ergocalciferol, (DRISDOL) 50000 UNITS CAPS capsule Take 50,000 Units by mouth 2 (two) times a week.    . Cetirizine HCl 10 MG CAPS Take by mouth.    . levonorgestrel-ethinyl estradiol (AVIANE,ALESSE,LESSINA) 0.1-20 MG-MCG tablet Take 1 tablet by mouth daily. (Patient not taking: Reported on 09/11/2014) 1 Package 11   No current facility-administered medications on file  prior to visit.   No Known Allergies   Family History  Problem Relation Age of Onset  . Hypertension Mother   . Diabetes Father   - also see history of present illness  PE: BP 118/68 mmHg  Pulse 98  Temp(Src) 98.6 F (37 C) (Oral)  Ht 5' (1.524 m)  Wt 131 lb (59.421 kg)  BMI 25.58 kg/m2  SpO2 98%  LMP 12/19/2014 Wt Readings from Last 3 Encounters:  01/12/15 131 lb (59.421 kg)  09/11/14 149 lb (67.586 kg)  07/14/14 143 lb 12.8 oz (65.227 kg)   Constitutional: slightly overweight, in NAD Eyes: PERRLA, EOMI, no exophthalmos, no lid lag, no stare ENT: moist mucous membranes, no thyromegaly, no cervical lymphadenopathy Cardiovascular: RRR, No MRG Respiratory: CTA B Gastrointestinal: abdomen soft, NT, ND, BS+ Musculoskeletal: no deformities, strength intact in all 4 Skin: moist, warm, no rashes Neurological: no tremor with outstretched hands, DTR normal in all 4  ASSESSMENT: 1. Mild Graves ds.  2. Elevated prolactin  PLAN:  1. Patient with mild Graves ds, based on the presence of TSI antibodies and her uptake and scan. She has off-and-on palpitations, which are not new or exacerbated. She has lost 20 pounds in the last 4 months, intentionally. She has tachycardia which may point towards hyperthyroidism. She is on methimazole 5 mg daily, which was started 4 months ago. Her menstrual cycles normalized after this. - Today we will check the TSH, fT3 and fT4  - we might need to do thyroid ultrasound depending on the results of the scan (if a cold nodule is present) - We will hold off adding beta blockers at this time, but we will add them if the tests are worse. - I advised her to join my chart to communicate easier - RTC in 6 months, but likely sooner for repeat labs  2. Elevated PRL Patient with previously high prolactin levels, normalized at last check  - high prolactin most frequently impacts menstrual cycles. She had 5 missed cycles before last visit. Her FSH was not high.  She mentions that her menstrual cycles normalized after the methimazole. - Will repeat prolactin level today.  Appointment on 01/12/2015  Component Date Value Ref Range Status  . TSH 01/12/2015 0.01* 0.35 - 4.50 uIU/mL Final  . Free T4 01/12/2015 2.50* 0.60 - 1.60 ng/dL Final  . T3, Free 95/28/4132 9.1* 2.3 - 4.2 pg/mL Final  . Prolactin 01/12/2015 30.9   Final   Comment:      Reference Ranges:                  Female:  2.1 -  17.1 ng/ml                  Female:   Pregnant          9.7 - 208.5 ng/mL                            Non Pregnant      2.8 -  29.2 ng/mL                            Post Menopausal   1.8 -  20.3 ng/mL                       PRL continues to decrease. Will cont to follow. Thyroid tests are worse, and at this point I suggest adding a low-dose of beta blocker and increasing the methimazole dose 10 mg in a.m. And 5 mg in p.m.   I will advise her to come back for labs in a month and a half.

## 2015-01-13 ENCOUNTER — Telehealth: Payer: Self-pay | Admitting: Internal Medicine

## 2015-01-13 ENCOUNTER — Ambulatory Visit (HOSPITAL_BASED_OUTPATIENT_CLINIC_OR_DEPARTMENT_OTHER)
Admission: RE | Admit: 2015-01-13 | Discharge: 2015-01-13 | Disposition: A | Discharge status: Home / Self Care | Payer: 59 | Source: Ambulatory Visit | Attending: Gynecology | Admitting: Gynecology

## 2015-01-13 ENCOUNTER — Encounter: Payer: Self-pay | Admitting: *Deleted

## 2015-01-13 DIAGNOSIS — Z1231 Encounter for screening mammogram for malignant neoplasm of breast: Secondary | ICD-10-CM | POA: Diagnosis not present

## 2015-01-13 LAB — PROLACTIN: PROLACTIN: 30.9 ng/mL

## 2015-01-13 LAB — T3, FREE: T3 FREE: 9.1 pg/mL — AB (ref 2.3–4.2)

## 2015-01-13 LAB — T4, FREE: Free T4: 2.5 ng/dL — ABNORMAL HIGH (ref 0.60–1.60)

## 2015-01-13 LAB — TSH: TSH: 0.01 u[IU]/mL — ABNORMAL LOW (ref 0.35–4.50)

## 2015-01-13 MED ORDER — ATENOLOL 25 MG PO TABS: 25.0000 mg | ORAL_TABLET | Freq: Every day | ORAL | Status: DC

## 2015-01-13 MED ORDER — METHIMAZOLE 5 MG PO TABS: ORAL_TABLET | ORAL | Status: DC

## 2015-01-13 NOTE — Addendum Note (Signed)
Addended by: Carlus Pavlov on: 01/13/2015 12:46 PM   Modules accepted: Orders

## 2015-01-13 NOTE — Telephone Encounter (Signed)
Returned pt's call and explained the dosage for both meds. Pt voiced understanding.

## 2015-01-13 NOTE — Telephone Encounter (Signed)
Pt has questions about dosage of the atenolol

## 2015-01-15 ENCOUNTER — Ambulatory Visit (HOSPITAL_BASED_OUTPATIENT_CLINIC_OR_DEPARTMENT_OTHER): Payer: 59

## 2015-01-23 ENCOUNTER — Telehealth: Payer: Self-pay | Admitting: *Deleted

## 2015-01-23 NOTE — Telephone Encounter (Signed)
[facility] asking if pt can have EKG done here, I and relayed we don't have a machine for that and pt can go to hospital to have done.

## 2015-04-27 ENCOUNTER — Other Ambulatory Visit (INDEPENDENT_AMBULATORY_CARE_PROVIDER_SITE_OTHER): Payer: 59

## 2015-04-27 DIAGNOSIS — E0590 Thyrotoxicosis, unspecified without thyrotoxic crisis or storm: Secondary | ICD-10-CM

## 2015-04-27 LAB — T4, FREE: Free T4: 0.3 ng/dL — ABNORMAL LOW (ref 0.60–1.60)

## 2015-04-27 LAB — T3, FREE: T3, Free: 3.3 pg/mL (ref 2.3–4.2)

## 2015-04-27 LAB — TSH: TSH: 84.61 u[IU]/mL — ABNORMAL HIGH (ref 0.35–4.50)

## 2015-05-13 HISTORY — PX: FUNCTIONAL ENDOSCOPIC SINUS SURGERY: SUR616

## 2015-05-13 HISTORY — PX: OTHER SURGICAL HISTORY: SHX169

## 2015-05-18 ENCOUNTER — Encounter: Payer: Self-pay | Admitting: Internal Medicine

## 2015-05-18 DIAGNOSIS — E0590 Thyrotoxicosis, unspecified without thyrotoxic crisis or storm: Secondary | ICD-10-CM

## 2015-05-18 NOTE — Progress Notes (Signed)
Called by Dr. Dione Booze: Patient seen in his office recently for regular eye exam while on Plaquenil. She noticed a visual field defect in one of her eyes and had her come back for repeat. The second visual field was worse. He obtained an MRI of her brain with focus on the pituitary that showed normal brain structures and pituitary gland, however ocular muscles were enlarged. The third visual field was even worse than the previous two. She was referred to [facility] ophthalmology, where she is seeing Dr. Toni Arthurs. She is planning to perform decompression surgery. However, due to the severity of the visual field defects, she recommended pulse steroids with Solu-Medrol. She can do this in our office, however patient would like to have this locally. Dr. Dione Booze called me to see if this can be arranged. Patient does not have a PCP. The recommended Solu-Medrol regimen: - 500 mg once a week for 6 weeks, followed by - 250 mg once a week for the next 6 weeks. I will try to arrange for this to be done at [facility] office. We'll discuss with the patient.

## 2015-05-27 ENCOUNTER — Other Ambulatory Visit (INDEPENDENT_AMBULATORY_CARE_PROVIDER_SITE_OTHER): Payer: 59

## 2015-05-27 DIAGNOSIS — E0590 Thyrotoxicosis, unspecified without thyrotoxic crisis or storm: Secondary | ICD-10-CM | POA: Diagnosis not present

## 2015-05-27 LAB — T3, FREE: T3, Free: 3.9 pg/mL (ref 2.3–4.2)

## 2015-05-27 LAB — TSH: TSH: 0.09 u[IU]/mL — AB (ref 0.35–4.50)

## 2015-05-27 LAB — T4, FREE: FREE T4: 1.53 ng/dL (ref 0.60–1.60)

## 2015-05-29 ENCOUNTER — Telehealth: Payer: Self-pay | Admitting: Internal Medicine

## 2015-05-29 ENCOUNTER — Other Ambulatory Visit: Payer: Self-pay | Admitting: Internal Medicine

## 2015-05-29 DIAGNOSIS — E0590 Thyrotoxicosis, unspecified without thyrotoxic crisis or storm: Secondary | ICD-10-CM

## 2015-05-29 MED ORDER — METHIMAZOLE 5 MG PO TABS: ORAL_TABLET | ORAL | Status: DC

## 2015-05-29 NOTE — Telephone Encounter (Signed)
Patient stated that Dr Ralene Muskrat talked to the Dr Elvera Lennox and she said for patient to call and get a sooner appointment due to her surgery. Please on what to do

## 2015-05-29 NOTE — Telephone Encounter (Signed)
Called pt and was able to work her into next week.

## 2015-06-01 ENCOUNTER — Encounter: Payer: Self-pay | Admitting: Internal Medicine

## 2015-06-01 ENCOUNTER — Ambulatory Visit (INDEPENDENT_AMBULATORY_CARE_PROVIDER_SITE_OTHER): Payer: 59 | Admitting: Internal Medicine

## 2015-06-01 ENCOUNTER — Other Ambulatory Visit: Payer: Self-pay | Admitting: Internal Medicine

## 2015-06-01 VITALS — BP 122/70 | HR 95 | Temp 98.2°F | Resp 12 | Wt 130.0 lb

## 2015-06-01 DIAGNOSIS — E0500 Thyrotoxicosis with diffuse goiter without thyrotoxic crisis or storm: Secondary | ICD-10-CM | POA: Diagnosis not present

## 2015-06-01 DIAGNOSIS — E221 Hyperprolactinemia: Secondary | ICD-10-CM | POA: Diagnosis not present

## 2015-06-01 DIAGNOSIS — Z7689 Persons encountering health services in other specified circumstances: Secondary | ICD-10-CM

## 2015-06-01 MED ORDER — METHIMAZOLE 5 MG PO TABS: ORAL_TABLET | ORAL | Status: DC

## 2015-06-01 NOTE — Progress Notes (Signed)
Patient ID: Cynthia Fox, female   DOB: 1970-11-25, 45 y.o.   MRN: [id number]   HPI  Cynthia Fox is a 45 y.o.-year-old female, initially referred by Dr Lily Peer, returning for Graves ds and elevated prolactin.  Graves ds:  I reviewed pt's thyroid tests: Lab Results  Component Value Date   TSH 0.09* 05/27/2015   TSH 84.61* 04/27/2015   TSH 0.01* 01/12/2015   TSH 0.09* 09/16/2014   TSH 0.009* 07/22/2014   TSH 0.009* 07/16/2014   FREET4 1.53 05/27/2015   FREET4 0.30* 04/27/2015   FREET4 2.50* 01/12/2015   FREET4 1.10 09/16/2014    TSI     <140 % baseline 297 (H)   TSI elevated, consistent with mild Graves' disease.  We started MMI and the dose was increased to 10 mg in am and 5 mg in pm >> TSH high  (as she did not return for recheck as advised) >> we stopped MMi >> TSH 0.09 >> advised her to restart MMi 5 mg daily on 05/27/2015 >> did not start yet!  Pt had a thyroid uptake in 08/08/2014 (unfortunately, a scan was not done at that time) and this returned at ULN, 28.4% (10-30%).  Reviewed tel notes: 05/18/2015:  Called by Dr. Dione Booze: Patient seen in his office recently for regular eye exam while on Plaquenil. She noticed a visual field defect in one of her eyes and had her come back for repeat. The second visual field was worse. He obtained an MRI of her brain with focus on the pituitary that showed normal brain structures and pituitary gland, however ocular muscles were enlarged. The third visual field was even worse than the previous two. She was referred to [facility] ophthalmology, where she is seeing Dr. Toni Arthurs. She is planning to perform decompression surgery. However, due to the severity of the visual field defects, she recommended pulse steroids with Solu-Medrol. She can do this in our office, however patient would like to have this locally. Dr. Dione Booze called me to see if this can be arranged. Patient does not have a PCP. The recommended Solu-Medrol regimen: - 500 mg once a  week for 6 weeks, followed by - 250 mg once a week for the next 6 weeks.  I d/w Dr Toni Arthurs and plan was for pt to have decompressive surgery first >> she had this 05/21/2015, and then start Solumedrol. She still has double vision.  Pt denies feeling nodules in neck, hoarseness, dysphagia/odynophagia, SOB with lying down.  She c/o: - + double vision - + HA - no hot flushes - no weight loss  - no fatigue - no excessive sweating/heat intolerance now, but had night sweats during the last winter - no tremors - no anxiety - no palpitations - no hyperdefecation - no hair loss - now menstrual cycles normal  Pt also has a h/o an elevated prolactin (normal: 2.8-29.2) - last level normal 4 mo ago: Component     Latest Ref Rng 07/16/2014 07/22/2014 09/16/2014 01/12/2015  Prolactin, Total        28.0   Prolactin, Monomeric     3.2 - 25.2 ng/mL   23.0   Prolactin      40.3 39.9  30.9   No milk discharge. No breast fulness. No vision pbs.   She also has a history of vit D deficiency. She had elevated LFTs in the past, resolved.   ROS: Constitutional: no weight loss, no fatigue, no subjective hyperthermia/hypothermia Eyes: no blurry vision, no xerophthalmia ENT: no sore  throat, no nodules palpated in throat, no dysphagia/odynophagia, no hoarseness Cardiovascular: no CP/SOB/palpitations/no leg swelling Respiratory: no cough/SOB Gastrointestinal: no N/V/D/C Musculoskeletal: no muscle/joint aches Skin: no rashes Neurological: no tremors/numbness/tingling/dizziness  I reviewed pt's medications, allergies, PMH, social hx, family hx, and changes were documented in the history of present illness. Otherwise, unchanged from my initial visit note.  Past Medical History  Diagnosis Date  . Hepatitis A   . GERD (gastroesophageal reflux disease)   . Oral candidiasis   . Seasonal allergies    No past surgical history on file.   History   Social History  . Marital Status: Married    Spouse  Name: N/A  . 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 Smoking status: Never Smoker   . Smokeless tobacco: Never Used  . Alcohol Use: Yes     Comment: weekends   Current Outpatient Prescriptions on File Prior to Visit  Medication Sig Dispense Refill  . hydroxychloroquine (PLAQUENIL) 200 MG tablet TK 1 T PO  QD  2  . pilocarpine (SALAGEN) 5 MG tablet TK 1 T PO  TID PRN  3  . atenolol (TENORMIN) 25 MG tablet Take 1 tablet (25 mg total) by mouth daily. (Patient not taking: Reported on 06/01/2015) 60 tablet 1   No current facility-administered medications on file prior to visit.   No Known Allergies   Family History  Problem Relation Age of Onset  . Hypertension Mother   . Diabetes Father   - also see history of present illness  PE: BP 122/70 mmHg  Pulse 95  Temp(Src) 98.2 F (36.8 C) (Oral)  Resp 12  Wt 130 lb (58.968 kg)  SpO2 99% Wt Readings from Last 3 Encounters:  06/01/15 130 lb (58.968 kg)  01/12/15 131 lb (59.421 kg)  09/11/14 149 lb (67.586 kg)   Constitutional: slightly overweight, in NAD Eyes: PERRLA, EOMI, no exophthalmos, bruises around eyes ENT: moist mucous membranes, no thyromegaly, no cervical lymphadenopathy Cardiovascular: RRR, No MRG Respiratory: CTA B Gastrointestinal: abdomen soft, NT, ND, BS+ Musculoskeletal: no deformities, strength intact in all 4 Skin: moist, warm, no rashes Neurological: no tremor with outstretched hands, DTR normal in all 4  ASSESSMENT: 1. Mild Graves ds.  2. Graves ophthalmopathy  3. Elevated prolactin  PLAN:  1. Patient with mild Graves ds, based on the presence of TSI antibodies and her uptake and scan. She has off-and-on palpitations, resolved now. She had a high TSH as she continued on a higher MMI for longer interval than advised >> we then stopped MMI and will restart now as the TSH has decreased again. - we will check the TSH, fT3 and fT4 in 5 weeks - We  will hold off adding beta blockers at this time - RTC in 4 months, but likely sooner for repeat labs  2. GO - sees Dr Toni Arthurs - has decompressive surgery 05/21/2015 - will start Solumedrol this week - 500 mg weekly x 6 doses, then 250 mg weekly x 6 doses  3. Elevated PRL Patient with previously high prolactin levels, normalized at last check  -  her menstrual cycles normalized  - Will repeat prolactin level at next visit

## 2015-06-01 NOTE — Patient Instructions (Addendum)
Start MMI 5 mg in am and come back for labs in 5 weeks.  Please have labs drawn BEFORE the Solumedrol injection.   Please come back for the Solumedrol inj. We will let you know when to come.

## 2015-06-04 ENCOUNTER — Telehealth: Payer: Self-pay | Admitting: Internal Medicine

## 2015-06-04 NOTE — Telephone Encounter (Signed)
Called pt and we are having her come tomorrow morning at 8:30 am for the first injection/500 mg.

## 2015-06-04 NOTE — Telephone Encounter (Signed)
Pt is wondering when she is to start the solumedrol please advise

## 2015-06-05 ENCOUNTER — Other Ambulatory Visit (INDEPENDENT_AMBULATORY_CARE_PROVIDER_SITE_OTHER): Payer: 59 | Admitting: *Deleted

## 2015-06-05 ENCOUNTER — Ambulatory Visit (INDEPENDENT_AMBULATORY_CARE_PROVIDER_SITE_OTHER): Payer: 59 | Admitting: Internal Medicine

## 2015-06-05 DIAGNOSIS — E0500 Thyrotoxicosis with diffuse goiter without thyrotoxic crisis or storm: Secondary | ICD-10-CM | POA: Diagnosis not present

## 2015-06-05 MED ORDER — METHYLPREDNISOLONE SODIUM SUCC 500 MG IJ SOLR
500.0000 mg | Freq: Once | INTRAMUSCULAR | Status: AC
  Administered 2015-06-05: 500 mg via INTRAVENOUS

## 2015-06-09 ENCOUNTER — Ambulatory Visit: Payer: 59

## 2015-06-12 ENCOUNTER — Ambulatory Visit (INDEPENDENT_AMBULATORY_CARE_PROVIDER_SITE_OTHER): Payer: 59 | Admitting: Internal Medicine

## 2015-06-12 ENCOUNTER — Other Ambulatory Visit (INDEPENDENT_AMBULATORY_CARE_PROVIDER_SITE_OTHER): Payer: 59 | Admitting: *Deleted

## 2015-06-12 DIAGNOSIS — E0500 Thyrotoxicosis with diffuse goiter without thyrotoxic crisis or storm: Secondary | ICD-10-CM

## 2015-06-12 MED ORDER — METHYLPREDNISOLONE SODIUM SUCC 500 MG IJ SOLR
500.0000 mg | Freq: Once | INTRAMUSCULAR | Status: AC
  Administered 2015-06-12: 500 mg via INTRAVENOUS

## 2015-06-19 ENCOUNTER — Ambulatory Visit (INDEPENDENT_AMBULATORY_CARE_PROVIDER_SITE_OTHER): Payer: 59 | Admitting: *Deleted

## 2015-06-19 ENCOUNTER — Other Ambulatory Visit (INDEPENDENT_AMBULATORY_CARE_PROVIDER_SITE_OTHER): Payer: 59 | Admitting: *Deleted

## 2015-06-19 ENCOUNTER — Telehealth: Payer: Self-pay | Admitting: Internal Medicine

## 2015-06-19 DIAGNOSIS — E0500 Thyrotoxicosis with diffuse goiter without thyrotoxic crisis or storm: Secondary | ICD-10-CM

## 2015-06-19 MED ORDER — METHYLPREDNISOLONE SODIUM SUCC 500 MG IJ SOLR
500.0000 mg | Freq: Four times a day (QID) | INTRAMUSCULAR | Status: DC
Start: 2015-06-19 — End: 2016-07-29

## 2015-06-19 MED ORDER — METHYLPREDNISOLONE SODIUM SUCC 500 MG IJ SOLR
500.0000 mg | Freq: Four times a day (QID) | INTRAMUSCULAR | Status: DC
  Administered 2015-06-19: 500 mg via INTRAVENOUS

## 2015-06-19 NOTE — Telephone Encounter (Signed)
Dr Elvera LennoxGherghe is requesting you take over PCP for this patient. Please advise.

## 2015-06-19 NOTE — Telephone Encounter (Signed)
Please arrange a OV w/ me at pt's convenience

## 2015-06-23 NOTE — Telephone Encounter (Signed)
Scheduled in cancellation spot for 3/15 3:00pm.

## 2015-06-24 ENCOUNTER — Ambulatory Visit (INDEPENDENT_AMBULATORY_CARE_PROVIDER_SITE_OTHER): Payer: 59 | Admitting: Internal Medicine

## 2015-06-24 VITALS — BP 116/74 | HR 94 | Temp 97.9°F | Ht 60.0 in | Wt 131.2 lb

## 2015-06-24 DIAGNOSIS — Z09 Encounter for follow-up examination after completed treatment for conditions other than malignant neoplasm: Secondary | ICD-10-CM

## 2015-06-24 DIAGNOSIS — E0500 Thyrotoxicosis with diffuse goiter without thyrotoxic crisis or storm: Secondary | ICD-10-CM | POA: Diagnosis not present

## 2015-06-24 DIAGNOSIS — M3500 Sicca syndrome, unspecified: Secondary | ICD-10-CM | POA: Diagnosis not present

## 2015-06-24 NOTE — Progress Notes (Signed)
Pre visit review using our clinic review tool, if applicable. No additional management support is needed unless otherwise documented below in the visit note. 

## 2015-06-24 NOTE — Progress Notes (Signed)
Subjective:    Patient ID: Cynthia Fox, female    DOB: 1971-04-06, 45 y.o.   MRN: [id number]  DOS:  06/24/2015 Type of visit - description : new patient, here to get established Interval history:  History of Graves' disease and ophthalmopathy, status post surgery,  on steroids temporarily;had diplopia before the surgery, diplopia persists. Sjogren, described problems swallowing and with her dentures.good compliance with medications,  Review of Systems  D enies nausea, vomiting. No blood in the stools or abdominal pain Past Medical History  Diagnosis Date  . Hepatitis A   . GERD (gastroesophageal reflux disease)   . Oral candidiasis   . Seasonal allergies   . Sjogren's disease (HCC)   . Thyroid eye disease   . Compressive optic neuropathy     Past Surgical History  Procedure Laterality Date  . Functional endoscopic sinus surgery Bilateral 05/2015  . Orbital decompression Bilateral 05/2015    Social History   Social History  . Marital Status: Married    Spouse Name: N/A  . Number of Children: 0  . 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 Smoking status: Never Smoker   . Smokeless tobacco: Never Used  . Alcohol Use: Yes     Comment: weekends  . Drug Use: Not on file  . Sexual Activity: Yes   Other Topics Concern  . Not on file   Social History Narrative   Original from [place]    Lives w/ husband        Medication List       This list is accurate as of: 06/24/15 11:59 PM.  Always use your most recent med list.               atenolol 25 MG tablet  Commonly known as:  TENORMIN  Take 1 tablet (25 mg total) by mouth daily.     hydroxychloroquine 200 MG tablet  Commonly known as:  PLAQUENIL  TK 1 T PO  QD     methimazole 5 MG tablet  Commonly known as:  TAPAZOLE  Take by mouth 5 mg in a.m. daily     pilocarpine 5 MG tablet  Commonly known as:  SALAGEN  TK 1 T PO  TID PRN     Vitamin D3  5000 units Caps  Take 1 capsule by mouth 2 (two) times a week.           Objective:   Physical Exam BP 116/74 mmHg  Pulse 94  Temp(Src) 97.9 F (36.6 C) (Oral)  Ht 5' (1.524 m)  Wt 131 lb 4 oz (59.535 kg)  BMI 25.63 kg/m2  SpO2 98%  LMP 06/14/2015 (Approximate) General:   Well developed, well nourished . NAD.  HEENT:  Normocephalic . Face symmetric, atraumatic Neck: No thyroid megaly Lungs:  CTA B Normal respiratory effort, no intercostal retractions, no accessory muscle use. Heart: RRR,  no murmur.  no pretibial edema bilaterally  Skin: Not pale. Not jaundice Neurologic:  alert & oriented X3.  Speech normal, gait appropriate for age and unassisted Psych--  Cognition and judgment appear intact.  Cooperative with normal attention span and concentration.  Behavior appropriate. No anxious or depressed appearing.    Assessment & Plan:   Assessment ENDO>> Dr Elvera Lennox --Luiz Blare' disease dx 2016, ophthalmopathy dx ~ 04-2015 , had diplopia, s/p orbital decompression surgery ~ 05-2015 >> persistent diplopia  --h/o hyperprolactinemia  Sjogren syndrome  Dx 2016  -- Dr Victory Dakinavenshwar  + ANA  Plan: Luiz BlareGraves' disease, recovering from bilateral eye surgery for ophthalmopathy. Follow-up closely by endocrinology. Tolerating the steroids well. Sjogren's syndrome: Apparently moderate  to severe symptoms, follow-up by rheumatology,multiple questions about this condition discussed. RTC 6 months

## 2015-06-24 NOTE — Patient Instructions (Signed)
  GO TO THE FRONT DESK Schedule your next appointment for a  Check up When?   In 4 months  Fasting?  Yes

## 2015-06-25 DIAGNOSIS — M3500 Sicca syndrome, unspecified: Secondary | ICD-10-CM | POA: Insufficient documentation

## 2015-06-25 DIAGNOSIS — Z09 Encounter for follow-up examination after completed treatment for conditions other than malignant neoplasm: Secondary | ICD-10-CM | POA: Insufficient documentation

## 2015-06-25 NOTE — Assessment & Plan Note (Signed)
Graves' disease, recovering from bilateral eye surgery for ophthalmopathy. Follow-up closely by endocrinology. Tolerating the steroids well. Sjogren's syndrome: Apparently moderate  to severe symptoms, follow-up by rheumatology,multiple questions about this condition discussed. RTC 6 months

## 2015-06-26 ENCOUNTER — Ambulatory Visit: Payer: 59

## 2015-07-03 ENCOUNTER — Ambulatory Visit (INDEPENDENT_AMBULATORY_CARE_PROVIDER_SITE_OTHER): Payer: 59 | Admitting: *Deleted

## 2015-07-03 ENCOUNTER — Other Ambulatory Visit (INDEPENDENT_AMBULATORY_CARE_PROVIDER_SITE_OTHER): Payer: 59 | Admitting: *Deleted

## 2015-07-03 DIAGNOSIS — E0500 Thyrotoxicosis with diffuse goiter without thyrotoxic crisis or storm: Secondary | ICD-10-CM

## 2015-07-03 MED ORDER — METHYLPREDNISOLONE SODIUM SUCC 500 MG IJ SOLR
500.0000 mg | Freq: Once | INTRAMUSCULAR | Status: AC
Start: 2015-07-03 — End: 2015-07-03
  Administered 2015-07-03: 500 mg via INTRAMUSCULAR

## 2015-07-10 ENCOUNTER — Other Ambulatory Visit (INDEPENDENT_AMBULATORY_CARE_PROVIDER_SITE_OTHER): Payer: 59

## 2015-07-10 DIAGNOSIS — E0500 Thyrotoxicosis with diffuse goiter without thyrotoxic crisis or storm: Secondary | ICD-10-CM

## 2015-07-10 DIAGNOSIS — E0590 Thyrotoxicosis, unspecified without thyrotoxic crisis or storm: Secondary | ICD-10-CM

## 2015-07-10 LAB — T3, FREE: T3 FREE: 3.8 pg/mL (ref 2.3–4.2)

## 2015-07-10 LAB — T4, FREE: FREE T4: 0.88 ng/dL (ref 0.60–1.60)

## 2015-07-10 LAB — TSH: TSH: 3.28 u[IU]/mL (ref 0.35–4.50)

## 2015-07-10 MED ORDER — METHYLPREDNISOLONE SODIUM SUCC 500 MG IJ SOLR
500.0000 mg | Freq: Once | INTRAMUSCULAR | Status: AC
  Administered 2015-07-10: 500 mg via INTRAMUSCULAR

## 2015-07-12 ENCOUNTER — Other Ambulatory Visit: Payer: Self-pay | Admitting: Internal Medicine

## 2015-07-12 DIAGNOSIS — E0500 Thyrotoxicosis with diffuse goiter without thyrotoxic crisis or storm: Secondary | ICD-10-CM

## 2015-07-12 MED ORDER — METHIMAZOLE 5 MG PO TABS: ORAL_TABLET | ORAL | Status: DC

## 2015-07-16 ENCOUNTER — Ambulatory Visit: Payer: 59 | Admitting: Internal Medicine

## 2015-07-17 ENCOUNTER — Other Ambulatory Visit (INDEPENDENT_AMBULATORY_CARE_PROVIDER_SITE_OTHER): Payer: 59 | Admitting: *Deleted

## 2015-07-17 ENCOUNTER — Ambulatory Visit (INDEPENDENT_AMBULATORY_CARE_PROVIDER_SITE_OTHER): Payer: 59 | Admitting: *Deleted

## 2015-07-17 DIAGNOSIS — E0500 Thyrotoxicosis with diffuse goiter without thyrotoxic crisis or storm: Secondary | ICD-10-CM

## 2015-07-17 MED ORDER — METHYLPREDNISOLONE SODIUM SUCC 125 MG IJ SOLR: 250.0000 mg | Freq: Once | INTRAMUSCULAR | Status: DC

## 2015-07-17 MED ORDER — METHYLPREDNISOLONE SODIUM SUCC 125 MG IJ SOLR
250.0000 mg | Freq: Once | INTRAMUSCULAR | Status: AC
  Administered 2015-07-17: 250 mg via INTRAMUSCULAR

## 2015-07-23 ENCOUNTER — Other Ambulatory Visit (INDEPENDENT_AMBULATORY_CARE_PROVIDER_SITE_OTHER): Payer: 59 | Admitting: *Deleted

## 2015-07-23 ENCOUNTER — Ambulatory Visit (INDEPENDENT_AMBULATORY_CARE_PROVIDER_SITE_OTHER): Payer: 59 | Admitting: *Deleted

## 2015-07-23 DIAGNOSIS — E0500 Thyrotoxicosis with diffuse goiter without thyrotoxic crisis or storm: Secondary | ICD-10-CM

## 2015-07-23 MED ORDER — METHYLPREDNISOLONE SODIUM SUCC 125 MG IJ SOLR
250.0000 mg | Freq: Once | INTRAMUSCULAR | Status: AC
  Administered 2015-07-23: 250 mg via INTRAVENOUS

## 2015-07-31 ENCOUNTER — Other Ambulatory Visit (INDEPENDENT_AMBULATORY_CARE_PROVIDER_SITE_OTHER): Payer: 59 | Admitting: *Deleted

## 2015-07-31 ENCOUNTER — Ambulatory Visit (INDEPENDENT_AMBULATORY_CARE_PROVIDER_SITE_OTHER): Payer: 59 | Admitting: *Deleted

## 2015-07-31 DIAGNOSIS — E0500 Thyrotoxicosis with diffuse goiter without thyrotoxic crisis or storm: Secondary | ICD-10-CM | POA: Diagnosis not present

## 2015-07-31 MED ORDER — METHYLPREDNISOLONE SODIUM SUCC 125 MG IJ SOLR
250.0000 mg | Freq: Once | INTRAMUSCULAR | Status: AC
  Administered 2015-07-31: 250 mg via INTRAMUSCULAR

## 2015-08-07 ENCOUNTER — Other Ambulatory Visit (INDEPENDENT_AMBULATORY_CARE_PROVIDER_SITE_OTHER): Payer: 59 | Admitting: *Deleted

## 2015-08-07 ENCOUNTER — Ambulatory Visit (INDEPENDENT_AMBULATORY_CARE_PROVIDER_SITE_OTHER): Payer: 59 | Admitting: *Deleted

## 2015-08-07 ENCOUNTER — Other Ambulatory Visit: Payer: Self-pay | Admitting: Internal Medicine

## 2015-08-07 DIAGNOSIS — E0500 Thyrotoxicosis with diffuse goiter without thyrotoxic crisis or storm: Secondary | ICD-10-CM

## 2015-08-07 MED ORDER — METHYLPREDNISOLONE SODIUM SUCC 125 MG IJ SOLR
250.0000 mg | Freq: Once | INTRAMUSCULAR | Status: AC
  Administered 2015-08-07: 250 mg via INTRAMUSCULAR

## 2015-08-14 ENCOUNTER — Ambulatory Visit (INDEPENDENT_AMBULATORY_CARE_PROVIDER_SITE_OTHER): Payer: 59 | Admitting: Internal Medicine

## 2015-08-14 ENCOUNTER — Other Ambulatory Visit (INDEPENDENT_AMBULATORY_CARE_PROVIDER_SITE_OTHER): Payer: 59

## 2015-08-14 DIAGNOSIS — E0500 Thyrotoxicosis with diffuse goiter without thyrotoxic crisis or storm: Secondary | ICD-10-CM

## 2015-08-14 LAB — T3, FREE: T3 FREE: 3.5 pg/mL (ref 2.3–4.2)

## 2015-08-14 LAB — TSH: TSH: 3.89 u[IU]/mL (ref 0.35–4.50)

## 2015-08-14 LAB — T4, FREE: FREE T4: 0.77 ng/dL (ref 0.60–1.60)

## 2015-08-14 MED ORDER — METHYLPREDNISOLONE SODIUM SUCC 125 MG IJ SOLR
125.0000 mg | Freq: Once | INTRAMUSCULAR | Status: AC
  Administered 2015-08-14: 125 mg via INTRAMUSCULAR

## 2015-08-18 LAB — THYROID STIMULATING IMMUNOGLOBULIN: TSI: 247 % baseline — ABNORMAL HIGH (ref <140)

## 2015-09-25 ENCOUNTER — Encounter: Payer: Self-pay | Admitting: Gynecology

## 2015-09-25 ENCOUNTER — Ambulatory Visit (INDEPENDENT_AMBULATORY_CARE_PROVIDER_SITE_OTHER): Payer: 59 | Admitting: Gynecology

## 2015-09-25 VITALS — BP 122/80 | Ht <= 58 in | Wt 137.0 lb

## 2015-09-25 DIAGNOSIS — Z8639 Personal history of other endocrine, nutritional and metabolic disease: Secondary | ICD-10-CM

## 2015-09-25 DIAGNOSIS — A499 Bacterial infection, unspecified: Secondary | ICD-10-CM

## 2015-09-25 DIAGNOSIS — N911 Secondary amenorrhea: Secondary | ICD-10-CM

## 2015-09-25 DIAGNOSIS — N760 Acute vaginitis: Secondary | ICD-10-CM | POA: Diagnosis not present

## 2015-09-25 DIAGNOSIS — B9689 Other specified bacterial agents as the cause of diseases classified elsewhere: Secondary | ICD-10-CM

## 2015-09-25 DIAGNOSIS — Z113 Encounter for screening for infections with a predominantly sexual mode of transmission: Secondary | ICD-10-CM | POA: Diagnosis not present

## 2015-09-25 DIAGNOSIS — N898 Other specified noninflammatory disorders of vagina: Secondary | ICD-10-CM

## 2015-09-25 DIAGNOSIS — Z8739 Personal history of other diseases of the musculoskeletal system and connective tissue: Secondary | ICD-10-CM

## 2015-09-25 DIAGNOSIS — Z01419 Encounter for gynecological examination (general) (routine) without abnormal findings: Secondary | ICD-10-CM

## 2015-09-25 DIAGNOSIS — M3500 Sicca syndrome, unspecified: Secondary | ICD-10-CM

## 2015-09-25 LAB — PREGNANCY, URINE: Preg Test, Ur: NEGATIVE

## 2015-09-25 LAB — WET PREP FOR TRICH, YEAST, CLUE
Clue Cells Wet Prep HPF POC: NONE SEEN
Trich, Wet Prep: NONE SEEN
YEAST WET PREP: NONE SEEN

## 2015-09-25 MED ORDER — METRONIDAZOLE 500 MG PO TABS: 500.0000 mg | ORAL_TABLET | Freq: Two times a day (BID) | ORAL | Status: DC

## 2015-09-25 MED ORDER — MEDROXYPROGESTERONE ACETATE 10 MG PO TABS: 10.0000 mg | ORAL_TABLET | Freq: Every day | ORAL | Status: DC

## 2015-09-25 NOTE — Progress Notes (Signed)
Cynthia AllegraJulia A Cogar 08/09/1970 [id number]   History:    45 y.o.  for annual gyn exam who is only complaint is of amenorrhea. Normal FSH less than 12 months ago. Patient denies any vasomotor symptoms. By using any form of contraception. Patient has interesting past medical history as follows:  Graves' disease which she's been followed by Dr. Lafe GarinGherge currently on methimazole Past history of hyperprolactinemia secondary to above in the past currently on no medication we will check prolactin level today. History of Sjogren's syndrome currently on no chronic steroid, currently on Plaquenil Recent endoscopic sinus surgery orbital decompression  No past history of any abnormal Pap smears. Patient denies any visual disturbances, unusual headaches, double vision or any nipple discharge.   Past medical history,surgical history, family history and social history were all reviewed and documented in the EPIC chart.  Gynecologic History Patient's last menstrual period was 06/18/2015. Contraception: none Last Pap: 2016. Results were: normal Last mammogram: 2016. Results were: normal  Obstetric History OB History  Gravida Para Term Preterm AB SAB TAB Ectopic Multiple Living  0 0 0 0 0 0 0 0 0 0          ROS: A ROS was performed and pertinent positives and negatives are included in the history.  GENERAL: No fevers or chills. HEENT: No change in vision, no earache, sore throat or sinus congestion. NECK: No pain or stiffness. CARDIOVASCULAR: No chest pain or pressure. No palpitations. PULMONARY: No shortness of breath, cough or wheeze. GASTROINTESTINAL: No abdominal pain, nausea, vomiting or diarrhea, melena or bright red blood per rectum. GENITOURINARY: No urinary frequency, urgency, hesitancy or dysuria. MUSCULOSKELETAL: No joint or muscle pain, no back pain, no recent trauma. DERMATOLOGIC: No rash, no itching, no lesions. ENDOCRINE: No polyuria, polydipsia, no heat or cold intolerance. No recent change in  weight. HEMATOLOGICAL: No anemia or easy bruising or bleeding. NEUROLOGIC: No headache, seizures, numbness, tingling or weakness. PSYCHIATRIC: No depression, no loss of interest in normal activity or change in sleep pattern.     Exam: chaperone present  BP 122/80 mmHg  Ht 4\' 10"  (1.473 m)  Wt 137 lb (62.143 kg)  BMI 28.64 kg/m2  LMP 06/18/2015  Body mass index is 28.64 kg/(m^2).  General appearance : Well developed well nourished female. No acute distress HEENT: Eyes: no retinal hemorrhage or exudates,  Neck supple, trachea midline, no carotid bruits, no thyroidmegaly Lungs: Clear to auscultation, no rhonchi or wheezes, or rib retractions  Heart: Regular rate and rhythm, no murmurs or gallops Breast:Examined in sitting and supine position were symmetrical in appearance, no palpable masses or tenderness,  no skin retraction, no nipple inversion, no nipple discharge, no skin discoloration, no axillary or supraclavicular lymphadenopathy Abdomen: no palpable masses or tenderness, no rebound or guarding Extremities: no edema or skin discoloration or tenderness  Pelvic:  Bartholin, Urethra, Skene Glands: Within normal limits             Vagina: No gross lesions or discharge  Cervix: No gross lesions or discharge  Uterus  anteverted, normal size, shape and consistency, non-tender and mobile  Adnexa  Without masses or tenderness  Anus and perineum  normal   Rectovaginal  normal sphincter tone without palpated masses or tenderness             Hemoccult not indicated     Assessment/Plan:  45 y.o. female for annual exam with secondary amenorrhea urine presented test today was negative. We are going to check her prolactin level  since she has had history of elevated prolactin in the past. Recent TSH one month ago was normal. If her prolactin level is normal I have given a prescription for Provera to take 10 mg 1 by mouth daily for 10 days. I've also recommended the Mirena IUD for contraception  as well as to prevent any potential future endometrial hyperplasia or malignancy from being amenorrheic. She had a normal pelvic exam otherwise. We did do a GC and Chlamydia culture wet prep because of her vaginal discharge. There were moderate bacteria and few white blood cells and she'll be placed on Flagyl 500 mg one by mouth twice a day for 7 days. The rest of her blood work is being drawn by her PCP. Pap smear not indicated this year. Patient instructed to do her monthly breast exam. She did for mammogram later this year. All the above insertions were provided in Spanish.   Ok Edwards MD, 4:53 PM 09/25/2015

## 2015-09-25 NOTE — Patient Instructions (Signed)
Levonorgestrel intrauterine device (IUD) Qu es este medicamento? El LEVONORGESTREL (DIU) es un dispositivo anticonceptivo (control de natalidad). El dispositivo se coloca dentro del tero por un profesional de la salud. Se utiliza para evitar el embarazo y tambin se puede utilizar para tratar el sangrado abundante que ocurre durante su perodo. Dependiendo del dispositivo, se puede utilizar por 3 a 5 aos. Este medicamento puede ser utilizado para otros usos; si tiene alguna pregunta consulte con su proveedor de atencin mdica o con su farmacutico. Qu le debo informar a mi profesional de la salud antes de tomar este medicamento? Necesita saber si usted presenta alguno de los siguientes problemas o situaciones: -exmen de Papanicolaou anormal -cncer de mama, cuello del tero o tero -diabetes -endometritis -si tiene una infeccin plvica o genital actual o en el pasado -tiene ms de una pareja sexual o si su pareja tiene ms de una pareja -enfermedad cardiaca -antecedente de embarazo tubrico o ectpico -problemas del sistema inmunolgico -DIU colocado -enfermedad heptica o tumor del hgado -problemas con la coagulacin o si toma diluyentes sanguneos -usa medicamentos intravenoso -forma inusual del tero -sangrado vaginal que no tiene explicacin -una reaccin alrgica o inusual al levonorgestrel, a otras hormonas, a la silicona o polietilenos, a otros medicamentos, alimentos, colorantes o conservantes -si est embarazada o buscando quedar embarazada -si est amamantando a un beb Cmo debo utilizar este medicamento? Un profesional de la salud coloca este dispositivo en el tero. Hable con su pediatra para informarse acerca del uso de este medicamento en nios. Puede requerir atencin especial. Sobredosis: Pngase en contacto inmediatamente con un centro toxicolgico o una sala de urgencia si usted cree que haya tomado demasiado medicamento. ATENCIN: Este medicamento es solo  para usted. No comparta este medicamento con nadie. Qu sucede si me olvido de una dosis? No se aplica en este caso. Qu puede interactuar con este medicamento? No tome esta medicina con ninguno de los siguientes medicamentos: -amprenavir -bosentano -fosamprenavir Esta medicina tambin puede interactuar con los siguientes medicamentos: -aprepitant -barbitricos para producir el sueo o para el tratamiento de convulsiones -bexaroteno -griseofulvina -medicamentos para tratar los convulsiones, tales como carbamazepina, etotona, felbamato, oxcarbazepina, fenitona, topiramato -modafinilo -pioglitazona -rifabutina -rifampicina -rifapentina -algunos medicamentos para tratar el virus VIH, tales como atazanavir, indinavir, lopinavir, nelfinavir, tipranavir, ritonavir -hierba de [place] -warfarina Puede ser que esta lista no menciona todas las posibles interacciones. Informe a su profesional de la salud de todos los productos a base de hierbas, medicamentos de venta libre o suplementos nutritivos que est tomando. Si usted fuma, consume bebidas alcohlicas o si utiliza drogas ilegales, indqueselo tambin a su profesional de la salud. Algunas sustancias pueden interactuar con su medicamento. A qu debo estar atento al usar este medicamento? Visite a su mdico o a su profesional de la salud para chequear su evolucin peridicamente. Visite a su mdico si usted o su pareja tiene relaciones sexuales con otras personas, se vuelve VIH positivo o contrae una enfermedad de transmisin sexual. Este medicamento no la protege de la infeccin por VIH (SIDA) ni de ninguna otra enfermedad de transmisin sexual. Puede controlar la ubicacin del DIU usted misma palpando con sus dedos limpios los hilos en la parte anterior de la vagina. No tire de los hilos. Es un buen hbito controlar la ubicacin del dispositivo despus de cada perodo menstrual. Si no slo siente los hilos sino que adems siente otra parte  ms del DIU o si no puede sentir los hilos, consulte a su mdico inmediatamente. El DIU puede salirse   por s solo. Puede quedar embarazada si el dispositivo se sale de su lugar. Utilice un mtodo anticonceptivo adicional, como preservativos, y consulte a su proveedor de atencin mdica s observa que el DIU se sali de su lugar. La utilizacin de tampones no cambia la posicin del DIU y no hay inconvenientes en usarlos durante su perodo. Qu efectos secundarios puedo tener al utilizar este medicamento? Efectos secundarios que debe informar a su mdico o a su profesional de la salud tan pronto como sea posible: -reacciones alrgicas como erupcin cutnea, picazn o urticarias, hinchazn de la cara, labios o lengua -fiebre, sntomas gripales -llagas genitales -alta presin sangunea -ausencia de un perodo menstrual durante 6 semanas mientras lo utiliza -dolor, hinchazn o calor en las piernas -dolor o sensibilidad del plvico -dolor de cabeza repentino o severo -signos de embarazo -calambres estomacales -falta de aliento repentina -problemas de coordinacin, del habla, al caminar -sangrado, flujo vaginal inusual -color amarillento de los ojos o la piel Efectos secundarios que, por lo general, no requieren atencin mdica (debe informarlos a su mdico o a su profesional de la salud si persisten o si son molestos): -acn -dolor de pecho -cambios en el deseo sexual o capacidad -cambios de peso -calambres, mareos o sensacin de desmayo mientras se introduce el dispositivo -dolor de cabeza -sangrado menstruales irregulares en los primeros 3 a 6 meses de usar -nuseas Puede ser que esta lista no menciona todos los posibles efectos secundarios. Comunquese a su mdico por asesoramiento mdico sobre los efectos secundarios. Usted puede informar los efectos secundarios a la FDA por telfono al 1-800-FDA-1088. Dnde debo guardar mi medicina? No se aplica en este caso. ATENCIN: Este folleto es  un resumen. Puede ser que no cubra toda la posible informacin. Si usted tiene preguntas acerca de esta medicina, consulte con su mdico, su farmacutico o su profesional de la salud.    2016, Elsevier/Gold Standard. (2014-05-20 00:00:00)  

## 2015-09-26 LAB — PROLACTIN: PROLACTIN: 28.5 ng/mL

## 2015-09-26 LAB — GC/CHLAMYDIA PROBE AMP
CT PROBE, AMP APTIMA: NOT DETECTED
GC Probe RNA: NOT DETECTED

## 2015-09-29 ENCOUNTER — Ambulatory Visit (INDEPENDENT_AMBULATORY_CARE_PROVIDER_SITE_OTHER): Payer: 59 | Admitting: Internal Medicine

## 2015-09-29 ENCOUNTER — Encounter: Payer: Self-pay | Admitting: Internal Medicine

## 2015-09-29 VITALS — BP 102/64 | HR 84 | Wt 138.0 lb

## 2015-09-29 DIAGNOSIS — E0500 Thyrotoxicosis with diffuse goiter without thyrotoxic crisis or storm: Secondary | ICD-10-CM | POA: Diagnosis not present

## 2015-09-29 LAB — T3, FREE: T3 FREE: 2.8 pg/mL (ref 2.3–4.2)

## 2015-09-29 LAB — TSH: TSH: 4.35 u[IU]/mL (ref 0.35–4.50)

## 2015-09-29 LAB — T4, FREE: FREE T4: 0.73 ng/dL (ref 0.60–1.60)

## 2015-09-29 NOTE — Progress Notes (Signed)
Patient ID: Cynthia Fox, female   DOB: [birth year], 45 y.o.   MRN: [id number]   HPI  Cynthia Fox is a 45 y.o.-year-old female, initially referred by Dr Lily Peer, returning for Graves ds, Graves ophthalmopathy, and elevated prolactin.  Graves ds:  Pt had a thyroid uptake in 08/08/2014 (unfortunately, a scan was not done at that time) and this returned at ULN, 28.4% (10-30%).  Component     Latest Ref Rng 09/16/2014 08/14/2015  TSI     <140 % baseline 297 (H) 247 (H)    We started MMI and the dose was increased to 10 mg in am and 5 mg in pm >> TSH high  (as she did not return for recheck as advised) >> we stopped MMi >> TSH 0.09 >> advised her to restart MMi 5 mg daily >> dose decreased to 2.5 mg in 08/2015. She is on this dose now.  I reviewed pt's thyroid tests: Lab Results  Component Value Date   TSH 3.89 08/14/2015   TSH 3.28 07/10/2015   TSH 0.09* 05/27/2015   TSH 84.61* 04/27/2015   TSH 0.01* 01/12/2015   TSH 0.09* 09/16/2014   TSH 0.009* 07/22/2014   TSH 0.009* 07/16/2014   FREET4 0.77 08/14/2015   FREET4 0.88 07/10/2015   FREET4 1.53 05/27/2015   FREET4 0.30* 04/27/2015   FREET4 2.50* 01/12/2015   FREET4 1.10 09/16/2014   Graves ophthalmopathy: 05/18/2015:  Called by Dr. Dione Booze: Patient seen in his office recently for regular eye exam while on Plaquenil. She noticed a visual field defect in one of her eyes and had her come back for repeat. The second visual field was worse. He obtained an MRI of her brain with focus on the pituitary that showed normal brain structures and pituitary gland, however ocular muscles were enlarged. The third visual field was even worse than the previous two. She was referred to [facility] ophthalmology, where she is seeing Dr. Toni Arthurs. She is planning to perform decompression surgery. However, due to the severity of the visual field defects, she recommended pulse steroids with Solu-Medrol. She can do this in our office, however patient would  like to have this locally. Dr. Dione Booze called me to see if this can be arranged.   The recommended Solu-Medrol regimen: - 500 mg once a week for 6 weeks, followed by - 250 mg once a week for the next 6 weeks.  I d/w Dr Toni Arthurs and plan was for pt to have decompressive surgery first >> she had this 05/21/2015, and then started the above Solumedrol regimen - last dose 08/2015.   Her double vision improved at looking FWD (still had some at looking sideways), but in last week, she again has double vision looking FWD.  Pt also has a h/o an elevated prolactin (normal: 2.8-29.2) - last level normal: Lab Results  Component Value Date   PROLACTIN 28.5 09/25/2015   PROLACTIN 30.9 01/12/2015   PROLACTIN 39.9 07/22/2014   PROLACTIN 40.3 07/16/2014   Component     Latest Ref Rng 09/16/2014  Prolactin, Total      28.0  Prolactin, Monomeric     3.2 - 25.2 ng/mL 23.0  No milk discharge. No breast fulness.   She also has a history of vit D deficiency. She had elevated LFTs in the past, resolved.   ROS: Constitutional: no weight loss, no fatigue, no subjective hyperthermia/hypothermia Eyes: + blurry vision, no xerophthalmia ENT: no sore throat, no nodules palpated in throat, no dysphagia/odynophagia, no hoarseness  Cardiovascular: no CP/SOB/palpitations/no leg swelling Respiratory: no cough/SOB Gastrointestinal: no N/V/D/C Musculoskeletal: no muscle/joint aches Skin: no rashes Neurological: no tremors/numbness/tingling/dizziness  I reviewed pt's medications, allergies, PMH, social hx, family hx, and changes were documented in the history of present illness. Otherwise, unchanged from my initial visit note. Since last visit >> started Flagyl, Progesterone.  Past Medical History  Diagnosis Date  . Hepatitis A   . GERD (gastroesophageal reflux disease)   . Oral candidiasis   . Seasonal allergies   . Sjogren's disease (HCC)   . Thyroid eye disease   . Compressive optic neuropathy    Past  Surgical History  Procedure Laterality Date  . Functional endoscopic sinus surgery Bilateral 05/2015  . Orbital decompression Bilateral 05/2015     History   Social History  . Marital Status: Married    Spouse Name: N/A  . 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 Smoking status: Never Smoker   . Smokeless tobacco: Never Used  . Alcohol Use: Yes     Comment: weekends   Current Outpatient Prescriptions on File Prior to Visit  Medication Sig Dispense Refill  . Cholecalciferol (VITAMIN D3) 5000 units CAPS Take 1 capsule by mouth 2 (two) times a week.    . hydroxychloroquine (PLAQUENIL) 200 MG tablet TK 1 T PO  QD  2  . medroxyPROGESTERone (PROVERA) 10 MG tablet Take 1 tablet (10 mg total) by mouth daily. 10 tablet 0  . methimazole (TAPAZOLE) 5 MG tablet Take by mouth 2.5 mg in a.m. daily 60 tablet 1  . metroNIDAZOLE (FLAGYL) 500 MG tablet Take 1 tablet (500 mg total) by mouth 2 (two) times daily. 14 tablet 0  . pilocarpine (SALAGEN) 5 MG tablet TK 1 T PO  TID PRN  3  . atenolol (TENORMIN) 25 MG tablet Take 1 tablet (25 mg total) by mouth daily. (Patient not taking: Reported on 06/01/2015) 60 tablet 1   Current Facility-Administered Medications on File Prior to Visit  Medication Dose Route Frequency Provider Last Rate Last Dose  . methylPREDNISolone sodium succinate (SOLU-MEDROL) injection 500 mg  500 mg Intramuscular Q6H Carlus Pavlov, MD       No Known Allergies   Family History  Problem Relation Age of Onset  . Hypertension Mother   . Diabetes Father   . Retinal detachment Mother   . Thyroid disease Mother   . Thyroid disease Sister   . Cancer - Other Sister     Tonsils  . Heart attack Maternal Grandfather   . Breast cancer Neg Hx   . Colon cancer Neg Hx   - also see history of present illness  PE: BP 102/64 mmHg  Pulse 84  Wt 138 lb (62.596 kg)  SpO2 94%  LMP 06/18/2015 Body mass index is 28.85  kg/(m^2). Wt Readings from Last 3 Encounters:  09/29/15 138 lb (62.596 kg)  09/25/15 137 lb (62.143 kg)  06/24/15 131 lb 4 oz (59.535 kg)   Constitutional: slightly overweight, in NAD Eyes: PERRLA, EOMI, + mild L exophthalmos ENT: moist mucous membranes, no thyromegaly, no cervical lymphadenopathy Cardiovascular: RRR, No MRG Respiratory: CTA B Gastrointestinal: abdomen soft, NT, ND, BS+ Musculoskeletal: no deformities, strength intact in all 4 Skin: moist, warm, no rashes Neurological: no tremor with outstretched hands, DTR normal in all 4  ASSESSMENT: 1. Mild Graves ds.  2. Graves ophthalmopathy  3. Elevated prolactin  PLAN:  1. Patient with  mild Graves ds, based on the presence of TSI antibodies and her uptake and scan. She has off-and-on palpitations, resolved now. She had a high TSH as she continued on a higher MMI for longer interval than advised >> we then stopped MMI and we had to restart (now at 2.5 mg daily) as last visit as the TSH has decreased again. - we will check the TSH, fT3 and fT4 today - RTC in 4 months, but likely sooner for repeat labs  2. GO - [facility] - has decompressive surgery 05/21/2015 - had Solumedrol - 500 mg weekly x 6 doses, then 250 mg weekly x 6 doses - last in 08/2015. This helped with her double vision looking in front of her >> but now coming back in last week. I advised her to contact Dr Toni ArthursFuller to see if any additional imaging tests needed at this point. She will do this tomorrow. She has an appt with her at the end of August.  3. Elevated PRL - Patient with previously high prolactin levels, normalized - reviewed last level: normal - her menstrual cycles are still irregular, likely perimenopausal - per ObGyn (Dr Lily PeerFernandez)  Office Visit on 09/29/2015  Component Date Value Ref Range Status  . Free T4 09/29/2015 0.73  0.60 - 1.60 ng/dL Final  . T3, Free 09/81/191406/20/2017 2.8  2.3 - 4.2 pg/mL Final  . TSH 09/29/2015 4.35  0.35 -  4.50 uIU/mL Final   Thyroid tests are good and we can stop Methimazole >> will check tests again in 1.5 months.

## 2015-09-29 NOTE — Patient Instructions (Signed)
Please continue the 2.5 mg Methimazole daily.  Please stop at the lab.  Please come back for a follow-up appointment in 4 months.

## 2015-09-30 ENCOUNTER — Telehealth: Payer: Self-pay | Admitting: Internal Medicine

## 2015-09-30 NOTE — Telephone Encounter (Signed)
Patient ask if you want her to stop medication methimazole, please advise

## 2015-09-30 NOTE — Telephone Encounter (Signed)
Results were released last night:  Entered by Carlus Pavlovristina Chasidy Janak, MD at 09/29/2015 6:04 PM    Read by Rebeca AllegraJulia A Pla at 09/29/2015 6:24 PM    Dear Ms Cynthia Fox,  Thyroid tests are good and we can stop Methimazole >> let's check tests again in 1.5 months.  Please call our main office number [phone]) to schedule a lab appointment.  Sincerely,  Carlus Pavlovristina Sutton Hirsch MD

## 2015-09-30 NOTE — Telephone Encounter (Signed)
I contacted the pt and advised of note below via voicemail. Requested a call back if the pt would like to discuss.  

## 2015-10-26 ENCOUNTER — Ambulatory Visit (INDEPENDENT_AMBULATORY_CARE_PROVIDER_SITE_OTHER): Payer: 59 | Admitting: Internal Medicine

## 2015-10-26 ENCOUNTER — Encounter: Payer: Self-pay | Admitting: Internal Medicine

## 2015-10-26 VITALS — BP 118/64 | HR 87 | Temp 98.2°F | Ht <= 58 in | Wt 136.1 lb

## 2015-10-26 DIAGNOSIS — G4700 Insomnia, unspecified: Secondary | ICD-10-CM

## 2015-10-26 DIAGNOSIS — M3500 Sicca syndrome, unspecified: Secondary | ICD-10-CM

## 2015-10-26 DIAGNOSIS — E0500 Thyrotoxicosis with diffuse goiter without thyrotoxic crisis or storm: Secondary | ICD-10-CM | POA: Diagnosis not present

## 2015-10-26 NOTE — Progress Notes (Signed)
Pre visit review using our clinic review tool, if applicable. No additional management support is needed unless otherwise documented below in the visit note. 

## 2015-10-26 NOTE — Progress Notes (Signed)
Subjective:    Patient ID: Cynthia Fox, female    DOB: Nov 18, 1970, 45 y.o.   MRN: [id number]  DOS:  10/26/2015 Type of visit - description : Routine office visit Interval history: In general feeling well,   diplopia was temporarily better after surgery and steroids but now is back. Sjogren: Not as well-controlled as she would desire. Complaining of difficulty sleeping for the last 2 weeks, okay to fall asleep but wakes up a few hours later.   Review of Systems Denies depression, anxiety. No headache or dizziness.   Past Medical History  Diagnosis Date  . Hepatitis A   . Oral candidiasis     hx  . Seasonal allergies   . Sjogren's disease (HCC)     positive ANA nad Ro  . Thyroid eye disease   . Compressive optic neuropathy     Past Surgical History  Procedure Laterality Date  . Functional endoscopic sinus surgery Bilateral 05/2015  . Orbital decompression Bilateral 05/2015    Social History   Social History  . Marital Status: Married    Spouse Name: N/A  . Number of Children: 0  . 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 Smoking status: Never Smoker   . Smokeless tobacco: Never Used  . Alcohol Use: 0.0 oz/week    0 Standard drinks or equivalent per week     Comment: weekends  . Drug Use: Not on file  . Sexual Activity: Yes   Other Topics Concern  . Not on file   Social History Narrative   Original from [place]    Lives w/ husband        Medication List       This list is accurate as of: 10/26/15 11:59 PM.  Always use your most recent med list.               hydroxychloroquine 200 MG tablet  Commonly known as:  PLAQUENIL  TK 1 T PO  QD     pilocarpine 5 MG tablet  Commonly known as:  SALAGEN  TK 1 T PO  TID PRN     Vitamin D3 5000 units Caps  Take 1 capsule by mouth 2 (two) times a week.           Objective:   Physical Exam BP 118/64 mmHg  Pulse 87  Temp(Src) 98.2 F (36.8  C) (Oral)  Ht  (1.473 m)  Wt 136 lb 2 oz (61.746 kg)  BMI 28.46 kg/m2  SpO2 99% General:   Well developed, well nourished . NAD.  HEENT:  Normocephalic . Face symmetric, atraumatic Lungs:  CTA B Normal respiratory effort, no intercostal retractions, no accessory muscle use. Heart: RRR,  no murmur.  No pretibial edema bilaterally  Skin: Not pale. Not jaundice Neurologic:  alert & oriented X3.  Speech normal, gait appropriate for age and unassisted Psych--  Cognition and judgment appear intact.  Cooperative with normal attention span and concentration.  Behavior appropriate. No anxious or depressed appearing.      Assessment & Plan:   Assessment ENDO:    Dr Elvera Lennox --Luiz Blare' disease dx 2016, ophthalmopathy dx ~ 04-2015 , had diplopia, s/p orbital decompression surgery ~ 05-2015 >> persistent diplopia  --h/o hyperprolactinemia  RHEUM:  Dr Victory Dakin  Sjogren syndrome  Dx 2016  --  + ANA  PLAN: Graves' disease with ophthalmopathy:  diplopia improve after surgery few  months ago and steroid therapy. Sx are back, to see a specialist soon. Sjogren's syndrome: Not as well-controlled as the patient would like, follow-up by rheumatology Insomnia: For the last 2 weeks only, sleep habits discussed Primary care: The following information was collected: Td 2014 per patient Female care by gynecology, last Pap smear 07/2014, last mammogram 01-2015 Never had a colonoscopy.  RTC 1 year

## 2015-10-26 NOTE — Patient Instructions (Signed)
  GO TO THE FRONT DESK Schedule your next appointment for a  Physical exam , 1 year    HEALTHY SLEEP Sleep hygiene: Basic rules for a good night's sleep  Sleep only as much as you need to feel rested and then get out of bed  Keep a regular sleep schedule  Avoid forcing sleep  Exercise regularly for at least 20 minutes, preferably 4 to 5 hours before bedtime  Avoid caffeinated beverages after lunch  Avoid alcohol near bedtime: no "night cap"  Avoid smoking, especially in the evening  Do not go to bed hungry  Adjust bedroom environment  Avoid prolonged use of light-emitting screens before bedtime   Deal with your worries before bedtime   \

## 2015-10-27 DIAGNOSIS — G4700 Insomnia, unspecified: Secondary | ICD-10-CM | POA: Insufficient documentation

## 2015-10-27 NOTE — Assessment & Plan Note (Signed)
Graves' disease with ophthalmopathy:  diplopia improve after surgery few months ago and steroid therapy. Sx are back, to see a specialist soon. Sjogren's syndrome: Not as well-controlled as the patient would like, follow-up by rheumatology Insomnia: For the last 2 weeks only, sleep habits discussed Primary care: The following information was collected: Td 2014 per patient Female care by gynecology, last Pap smear 07/2014, last mammogram 01-2015 Never had a colonoscopy.  RTC 1 year

## 2015-11-11 ENCOUNTER — Other Ambulatory Visit (INDEPENDENT_AMBULATORY_CARE_PROVIDER_SITE_OTHER): Payer: 59

## 2015-11-11 DIAGNOSIS — E0500 Thyrotoxicosis with diffuse goiter without thyrotoxic crisis or storm: Secondary | ICD-10-CM | POA: Diagnosis not present

## 2015-11-11 LAB — TSH: TSH: 1.27 u[IU]/mL (ref 0.35–4.50)

## 2015-11-11 LAB — T4, FREE: FREE T4: 0.82 ng/dL (ref 0.60–1.60)

## 2015-11-11 LAB — T3, FREE: T3, Free: 3.7 pg/mL (ref 2.3–4.2)

## 2016-01-11 ENCOUNTER — Other Ambulatory Visit: Payer: Self-pay | Admitting: Gynecology

## 2016-01-11 DIAGNOSIS — Z1231 Encounter for screening mammogram for malignant neoplasm of breast: Secondary | ICD-10-CM

## 2016-01-13 ENCOUNTER — Ambulatory Visit (HOSPITAL_BASED_OUTPATIENT_CLINIC_OR_DEPARTMENT_OTHER)
Admission: RE | Admit: 2016-01-13 | Discharge: 2016-01-13 | Disposition: A | Discharge status: Home / Self Care | Payer: 59 | Source: Ambulatory Visit | Attending: Gynecology | Admitting: Gynecology

## 2016-01-13 DIAGNOSIS — Z1231 Encounter for screening mammogram for malignant neoplasm of breast: Secondary | ICD-10-CM | POA: Diagnosis not present

## 2016-01-29 ENCOUNTER — Ambulatory Visit (INDEPENDENT_AMBULATORY_CARE_PROVIDER_SITE_OTHER): Payer: 59 | Admitting: Internal Medicine

## 2016-01-29 ENCOUNTER — Encounter: Payer: Self-pay | Admitting: Internal Medicine

## 2016-01-29 VITALS — BP 110/74 | HR 95 | Wt 140.0 lb

## 2016-01-29 DIAGNOSIS — E221 Hyperprolactinemia: Secondary | ICD-10-CM

## 2016-01-29 DIAGNOSIS — E0500 Thyrotoxicosis with diffuse goiter without thyrotoxic crisis or storm: Secondary | ICD-10-CM | POA: Diagnosis not present

## 2016-01-29 NOTE — Progress Notes (Addendum)
Patient ID: Cynthia Fox, female   DOB: [birth year], 45 y.o.   MRN: [id number]   HPI  Cynthia Fox is a 45 y.o.-year-old female, initially referred by Dr Lily Peer, returning for Graves ds, Graves ophthalmopathy, and elevated prolactin. Last visit 4 mo ago.  She again has double vision, which was initially better after the initial orbital decompression sxs >> saw Dr. Toni Arthurs in 09/2015 >> recommended a possible new sx (muscle decompression?), but not before Graves Abs decrease (no sooner than 3 years). Until then, she will have corrective glasses (Dr. Venita Lick). She will have these ready next week.  Graves ds:  Pt had a thyroid uptake in 08/08/2014 (unfortunately, a scan was not done at that time) and this returned at ULN, 28.4% (10-30%).  Component     Latest Ref Rng 09/16/2014 08/14/2015  TSI     <140 % baseline 297 (H) 247 (H)    We started MMI and the dose was increased to 10 mg in am and 5 mg in pm >> TSH high  (as she did not return for recheck as advised) >> we stopped MMi >> TSH 0.09 >> advised her to restart MMi 5 mg daily >> dose decreased to 2.5 mg in 08/2015. She is off this dose now.  I reviewed pt's thyroid tests: Lab Results  Component Value Date   TSH 1.27 11/11/2015   TSH 4.35 09/29/2015   TSH 3.89 08/14/2015   TSH 3.28 07/10/2015   TSH 0.09 (L) 05/27/2015   TSH 84.61 (H) 04/27/2015   TSH 0.01 (L) 01/12/2015   TSH 0.09 (L) 09/16/2014   TSH 0.009 (L) 07/22/2014   TSH 0.009 (L) 07/16/2014   FREET4 0.82 11/11/2015   FREET4 0.73 09/29/2015   FREET4 0.77 08/14/2015   FREET4 0.88 07/10/2015   FREET4 1.53 05/27/2015   FREET4 0.30 (L) 04/27/2015   FREET4 2.50 (H) 01/12/2015   FREET4 1.10 09/16/2014   Graves ophthalmopathy: 05/18/2015:  Called by Dr. Dione Booze: Patient seen in his office recently for regular eye exam while on Plaquenil. She noticed a visual field defect in one of her eyes and had her come back for repeat. The second visual field was worse. He obtained  an MRI of her brain with focus on the pituitary that showed normal brain structures and pituitary gland, however ocular muscles were enlarged. The third visual field was even worse than the previous two. She was referred to [facility] ophthalmology, where she is seeing Dr. Toni Arthurs. She is planning to perform decompression surgery. However, due to the severity of the visual field defects, she recommended pulse steroids with Solu-Medrol. She can do this in our office, however patient would like to have this locally. Dr. Dione Booze called me to see if this can be arranged.   The recommended Solu-Medrol regimen: - 500 mg once a week for 6 weeks, followed by - 250 mg once a week for the next 6 weeks.  I d/w Dr Toni Arthurs and plan was for pt to have decompressive surgery first >> she had this 05/21/2015, and then started the above Solumedrol regimen - last dose 08/2015.   Her double vision improved, but she again has double vision looking FWD. Also, her L eye is looking inward more now.  Pt also has a h/o an elevated prolactin (normal: 2.8-29.2) - last level normal: Lab Results  Component Value Date   PROLACTIN 28.5 09/25/2015   PROLACTIN 30.9 01/12/2015   PROLACTIN 39.9 07/22/2014   PROLACTIN 40.3 07/16/2014  Component     Latest Ref Rng 09/16/2014  Prolactin, Total      28.0  Prolactin, Monomeric     3.2 - 25.2 ng/mL 23.0  No milk discharge. No breast fulness.   She also has a history of vit D deficiency. She had elevated LFTs in the past, resolved.   ROS: Constitutional: + weight gain, no fatigue, no subjective hyperthermia/hypothermia Eyes: + blurry vision, no xerophthalmia ENT: no sore throat, no nodules palpated in throat, no dysphagia/odynophagia, no hoarseness Cardiovascular: no CP/SOB/palpitations/no leg swelling Respiratory: no cough/SOB Gastrointestinal: no N/V/D/C Musculoskeletal: no muscle/joint aches Skin: no rashes Neurological: no tremors/numbness/tingling/dizziness  I  reviewed pt's medications, allergies, PMH, social hx, family hx, and changes were documented in the history of present illness. Otherwise, unchanged from my initial visit note.   Past Medical History:  Diagnosis Date  . Compressive optic neuropathy   . Hepatitis A   . Oral candidiasis    hx  . Seasonal allergies   . Sjogren's disease (HCC)    positive ANA nad Ro  . Thyroid eye disease    Past Surgical History:  Procedure Laterality Date  . FUNCTIONAL ENDOSCOPIC SINUS SURGERY Bilateral 05/2015  . Orbital Decompression Bilateral 05/2015     History   Social History  . Marital Status: Married    Spouse Name: N/A  . 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 Smoking status: Never Smoker   . Smokeless tobacco: Never Used  . Alcohol Use: Yes     Comment: weekends   Current Outpatient Prescriptions on File Prior to Visit  Medication Sig Dispense Refill  . Cholecalciferol (VITAMIN D3) 5000 units CAPS Take 1 capsule by mouth 2 (two) times a week.    . hydroxychloroquine (PLAQUENIL) 200 MG tablet TK 1 T PO  QD  2  . pilocarpine (SALAGEN) 5 MG tablet TK 1 T PO  TID PRN  3   Current Facility-Administered Medications on File Prior to Visit  Medication Dose Route Frequency Provider Last Rate Last Dose  . methylPREDNISolone sodium succinate (SOLU-MEDROL) injection 500 mg  500 mg Intramuscular Q6H Carlus Pavlov, MD       No Known Allergies   Family History  Problem Relation Age of Onset  . Hypertension Mother   . Diabetes Father   . Retinal detachment Mother   . Thyroid disease Mother   . Thyroid disease Sister   . Cancer - Other Sister     Tonsils  . Heart attack Maternal Grandfather   . Breast cancer Neg Hx   . Colon cancer Neg Hx   - also see history of present illness  PE: BP 110/74 (BP Location: Left Arm, Patient Position: Sitting)   Pulse 95   Wt 140 lb (63.5 kg)   LMP 10/16/2015   SpO2 98%   BMI 29.26  kg/m  Body mass index is 29.26 kg/m. Wt Readings from Last 3 Encounters:  01/29/16 140 lb (63.5 kg)  10/26/15 136 lb 2 oz (61.7 kg)  09/29/15 138 lb (62.6 kg)   Constitutional: slightly overweight, in NAD Eyes: PERRLA, EOMI, + mild L exophthalmos + now also L esotropia ENT: moist mucous membranes, no thyromegaly, no cervical lymphadenopathy Cardiovascular: RRR, No MRG Respiratory: CTA B Gastrointestinal: abdomen soft, NT, ND, BS+ Musculoskeletal: no deformities, strength intact in all 4 Skin: moist, warm, no rashes Neurological: no tremor with outstretched hands, DTR normal in all 4  ASSESSMENT: 1. Mild Graves ds.  2. Graves ophthalmopathy  3. Elevated prolactin  PLAN:  1. Patient with mild Graves ds, based on the presence of TSI antibodies and her uptake and scan. She had off-and-on palpitations, resolved now. We started MMI initially, but she was able to come off >> stopped 09/2015. Her TSIs are decreasing but they are still high >> we may need to restart MMI low dose. - we will check the TSH, fT3 and fT4 today. Will also add TSI level. - RTC in 6 months, but likely sooner for repeat labs  2. GO - with significant diplopia - [facility] - has decompressive surgery 05/21/2015 - had Solumedrol - 500 mg weekly x 6 doses, then 250 mg weekly x 6 doses - last in 08/2015. This helped with her double vision looking in front of her >> but now returned. She will have corrective glasses fitted next week.  3. Elevated PRL - Patient with previously high prolactin levels, normalized - reviewed last level: normal - her menstrual cycles are still irregular, likely perimenopausal - per ObGyn (Dr Lily PeerFernandez) - will recheck PRL level at next visit  Component     Latest Ref Rng & Units 01/29/2016  TSH     0.450 - 4.500 uIU/mL 0.810  T4,Free(Direct)     0.82 - 1.77 ng/dL 4.251.31  Triiodothyronine,Free,Serum     2.0 - 4.4 pg/mL 3.7  TSI     0 - 139 % 471 (H)   TSI have  increased >> Graves ds. appears still active, despite normal TFTs! Will advise her to start Selenium 100 mg bid, as this was shown to have a significant improvement in the clinical ds. activity score versus placebo, with no adverse effects: "Marcocci C, et al. Selenium and the course of mild Graves' orbitopathy. Macy MisN Engl J Med. 2011;364(20):1920-31."  Carlus Pavlovristina Julissa Browning, MD PhD [facility]

## 2016-01-29 NOTE — Patient Instructions (Signed)
Please stay off Methimazole for now.  Please stop at the lab.  Please come back for a follow-up appointment in 6 months.

## 2016-02-11 ENCOUNTER — Other Ambulatory Visit: Payer: Self-pay | Admitting: Internal Medicine

## 2016-02-11 DIAGNOSIS — E0500 Thyrotoxicosis with diffuse goiter without thyrotoxic crisis or storm: Secondary | ICD-10-CM

## 2016-02-11 LAB — THYROID STIMULATING IMMUNOGLOBULIN: TSI: 471 % — AB (ref 0–139)

## 2016-02-11 LAB — T4, FREE: FREE T4: 1.31 ng/dL (ref 0.82–1.77)

## 2016-02-11 LAB — T3, FREE: T3 FREE: 3.7 pg/mL (ref 2.0–4.4)

## 2016-02-11 LAB — TSH: TSH: 0.81 u[IU]/mL (ref 0.450–4.500)

## 2016-02-12 ENCOUNTER — Encounter: Payer: Self-pay | Admitting: Internal Medicine

## 2016-03-03 ENCOUNTER — Other Ambulatory Visit: Payer: Self-pay | Admitting: Rheumatology

## 2016-03-03 DIAGNOSIS — Z79899 Other long term (current) drug therapy: Secondary | ICD-10-CM

## 2016-03-07 NOTE — Telephone Encounter (Signed)
Labs due for PLQ refill Last eye exam 04/08/15 eye exam due next month Last visit 10/16/15 Next visit 03/24/16 Will call patient Ok to refill one month supply  per Dr Corliss Skainseveshwar

## 2016-03-09 ENCOUNTER — Other Ambulatory Visit: Payer: Self-pay | Admitting: Rheumatology

## 2016-03-09 LAB — CBC WITH DIFFERENTIAL/PLATELET
BASOS PCT: 0 %
Basophils Absolute: 0 cells/uL (ref 0–200)
EOS ABS: 204 {cells}/uL (ref 15–500)
Eosinophils Relative: 3 %
HEMATOCRIT: 38.4 % (ref 35.0–45.0)
HEMOGLOBIN: 12.5 g/dL (ref 11.7–15.5)
LYMPHS ABS: 2448 {cells}/uL (ref 850–3900)
Lymphocytes Relative: 36 %
MCH: 33.3 pg — ABNORMAL HIGH (ref 27.0–33.0)
MCHC: 32.6 g/dL (ref 32.0–36.0)
MCV: 102.4 fL — AB (ref 80.0–100.0)
MONO ABS: 544 {cells}/uL (ref 200–950)
MPV: 11.7 fL (ref 7.5–12.5)
Monocytes Relative: 8 %
Neutro Abs: 3604 cells/uL (ref 1500–7800)
Neutrophils Relative %: 53 %
Platelets: 297 10*3/uL (ref 140–400)
RBC: 3.75 MIL/uL — AB (ref 3.80–5.10)
RDW: 13.6 % (ref 11.0–15.0)
WBC: 6.8 10*3/uL (ref 3.8–10.8)

## 2016-03-09 LAB — COMPLETE METABOLIC PANEL WITH GFR
ALT: 20 U/L (ref 6–29)
AST: 24 U/L (ref 10–35)
Albumin: 4.3 g/dL (ref 3.6–5.1)
Alkaline Phosphatase: 99 U/L (ref 33–115)
BUN: 10 mg/dL (ref 7–25)
CALCIUM: 9.2 mg/dL (ref 8.6–10.2)
CHLORIDE: 103 mmol/L (ref 98–110)
CO2: 27 mmol/L (ref 20–31)
CREATININE: 0.62 mg/dL (ref 0.50–1.10)
GFR, Est Non African American: >89 mL/min (ref >=60)
Glucose, Bld: 96 mg/dL (ref 65–99)
POTASSIUM: 4.2 mmol/L (ref 3.5–5.3)
Sodium: 137 mmol/L (ref 135–146)
Total Bilirubin: 0.7 mg/dL (ref 0.2–1.2)
Total Protein: 7.3 g/dL (ref 6.1–8.1)

## 2016-03-22 DIAGNOSIS — Z79899 Other long term (current) drug therapy: Secondary | ICD-10-CM | POA: Insufficient documentation

## 2016-03-22 NOTE — Progress Notes (Signed)
Office Visit Note  Patient: Cynthia Fox             Date of Birth: [birth year]           MRN: [id number]             PCP: Willow Ora, MD Referring: Lily Peer Visit Date: 03/24/2016 Occupation: Shipping clerk    Subjective:  raynauds   History of Present Illness: Cynthia Fox is a 45 y.o. female with Sjogren's syndrome. She states she continues to have dry mouth and dry eyes. She's been also having trouble with Raynauds phenomena and as the temperature has gone down. Her hands hurt only when exposed to the cold temperatures. She denies any joint swelling she states a few weeks back she developed sprain in the right side of her lower back for which she took some Aleve and the symptoms improved. The back pain flares off and on.  Activities of Daily Living:  Patient reports morning stiffness for 0 minute.   Patient Denies nocturnal pain.  Difficulty dressing/grooming: Denies Difficulty climbing stairs: Denies Difficulty getting out of chair: Denies Difficulty using hands for taps, buttons, cutlery, and/or writing: Denies   Review of Systems  Constitutional: Negative for fatigue, night sweats, weight gain, weight loss and weakness.  HENT: Positive for mouth dryness. Negative for mouth sores, trouble swallowing, trouble swallowing and nose dryness.   Eyes: Positive for dryness. Negative for pain, redness and visual disturbance.  Respiratory: Negative for cough, shortness of breath and difficulty breathing.   Cardiovascular: Negative for chest pain, palpitations, hypertension, irregular heartbeat and swelling in legs/feet.  Gastrointestinal: Negative for blood in stool, constipation and diarrhea.  Endocrine: Negative for increased urination.  Genitourinary: Negative for vaginal dryness.  Musculoskeletal: Positive for arthralgias and joint pain. Negative for joint swelling, myalgias, muscle weakness, morning stiffness, muscle tenderness and myalgias.  Skin: Positive for color change.  Negative for rash, hair loss, skin tightness, ulcers and sensitivity to sunlight.  Allergic/Immunologic: Negative for susceptible to infections.  Neurological: Negative for dizziness, memory loss and night sweats.  Hematological: Negative for swollen glands.  Psychiatric/Behavioral: Negative for depressed mood and sleep disturbance. The patient is not nervous/anxious.     PMFS History:  Patient Active Problem List   Diagnosis Date Noted  . H/O seasonal allergies 03/23/2016  . Vitamin D deficiency 03/23/2016  . Iritis 03/22/2016  . High risk medication use 03/22/2016  . Insomnia 10/27/2015  . Sjogren's syndrome (HCC) 06/25/2015  . PCP NOTES >>>>>>>>>>>>>>>>>>>>>> 06/25/2015  . Graves disease 06/01/2015  . Graves' ophthalmopathy 06/01/2015  . Encounter to establish care 06/01/2015  . Hyperprolactinemia (HCC) 01/12/2015  . Elevated antinuclear antibody (ANA) level 07/23/2014    Past Medical History:  Diagnosis Date  . Compressive optic neuropathy   . Hepatitis A   . Oral candidiasis    hx  . Seasonal allergies   . Sjogren's disease (HCC)    positive ANA nad Ro  . Thyroid eye disease     Family History  Problem Relation Age of Onset  . Hypertension Mother   . Retinal detachment Mother   . Thyroid disease Mother   . Diabetes Father   . Thyroid disease Sister   . Cancer - Other Sister     Tonsils  . Heart attack Maternal Grandfather   . Breast cancer Neg Hx   . Colon cancer Neg Hx    Past Surgical History:  Procedure Laterality Date  . FUNCTIONAL ENDOSCOPIC SINUS SURGERY Bilateral 05/2015  .  Orbital Decompression Bilateral 05/2015   Social History   Social History Narrative   Original from [place]    Lives w/ husband     Objective: Vital Signs: BP 110/64 (BP Location: Left Arm, Patient Position: Sitting, Cuff Size: Large)   Pulse 88   Resp 14   Ht 5' (1.524 m)   Wt 140 lb (63.5 kg)   BMI 27.34 kg/m    Physical Exam  Constitutional: She is oriented to person,  place, and time. She appears well-developed and well-nourished.  HENT:  Head: Normocephalic and atraumatic.  Eyes: Conjunctivae and EOM are normal.  Neck: Normal range of motion.  Cardiovascular: Normal rate, regular rhythm, normal heart sounds and intact distal pulses.   Pulmonary/Chest: Effort normal and breath sounds normal.  Abdominal: Soft. Bowel sounds are normal.  Lymphadenopathy:    She has no cervical adenopathy.  Neurological: She is alert and oriented to person, place, and time.  Skin: Skin is warm and dry. Capillary refill takes 2 to 3 seconds.  Psychiatric: She has a normal mood and affect. Her behavior is normal.  Nursing note and vitals reviewed.    Musculoskeletal Exam: C-spine, thoracic, lumbar spine good range of motion. Shoulder joints, elbow joints, wrist joints, MCPs, PIPs DIPs with good range of motion with no swelling. Hip joints knee joints ankles MTPs PIPs with good range of motion with no synovitis. She has positive Raynauds phenomenon on examination.  CDAI Exam: No CDAI exam completed.    Investigation: Findings:  May 2016:  Comprehensive metabolic panel showed alkaline phosphatase of 123, ALT of 37.  UA showed some bacteria but no white cell count.  Sed rate was normal.  G6PD, immunoglobulins, and SPEP were normal.  Vitamin D was low at 14.  PAN ANCA was negative.  ACE was normal.  Rheumatoid factor 47.  CCP was negative.  Complements were normal.  ENA was positive for Ro antibody. July 2017 CBC normal, CMP normal, UA negative     Orders Only on 03/09/2016  Component Date Value Ref Range Status  . Sodium 03/09/2016 137  135 - 146 mmol/L Final  . Potassium 03/09/2016 4.2  3.5 - 5.3 mmol/L Final  . Chloride 03/09/2016 103  98 - 110 mmol/L Final  . CO2 03/09/2016 27  20 - 31 mmol/L Final  . Glucose, Bld 03/09/2016 96  65 - 99 mg/dL Final  . BUN 03/09/2016 10  7 - 25 mg/dL Final  . Creat 03/09/2016 0.62  0.50 - 1.10 mg/dL Final  . Total Bilirubin  03/09/2016 0.7  0.2 - 1.2 mg/dL Final  . Alkaline Phosphatase 03/09/2016 99  33 - 115 U/L Final  . AST 03/09/2016 24  10 - 35 U/L Final  . ALT 03/09/2016 20  6 - 29 U/L Final  . Total Protein 03/09/2016 7.3  6.1 - 8.1 g/dL Final  . Albumin 03/09/2016 4.3  3.6 - 5.1 g/dL Final  . Calcium 03/09/2016 9.2  8.6 - 10.2 mg/dL Final  . GFR, Est African American 03/09/2016 >89  >=60 mL/min Final  . GFR, Est Non African American 03/09/2016 >89  >=60 mL/min Final  . WBC 03/09/2016 6.8  3.8 - 10.8 K/uL Final  . RBC 03/09/2016 3.75* 3.80 - 5.10 MIL/uL Final  . Hemoglobin 03/09/2016 12.5  11.7 - 15.5 g/dL Final  . HCT 03/09/2016 38.4  35.0 - 45.0 % Final  . MCV 03/09/2016 102.4* 80.0 - 100.0 fL Final  . MCH 03/09/2016 33.3* 27.0 - 33.0 pg Final  .  MCHC 03/09/2016 32.6  32.0 - 36.0 g/dL Final  . RDW 03/09/2016 13.6  11.0 - 15.0 % Final  . Platelets 03/09/2016 297  140 - 400 K/uL Final  . MPV 03/09/2016 11.7  7.5 - 12.5 fL Final  . Neutro Abs 03/09/2016 3604  1,500 - 7,800 cells/uL Final  . Lymphs Abs 03/09/2016 2448  850 - 3,900 cells/uL Final  . Monocytes Absolute 03/09/2016 544  200 - 950 cells/uL Final  . Eosinophils Absolute 03/09/2016 204  15 - 500 cells/uL Final  . Basophils Absolute 03/09/2016 0  0 - 200 cells/uL Final  . Neutrophils Relative % 03/09/2016 53  % Final  . Lymphocytes Relative 03/09/2016 36  % Final  . Monocytes Relative 03/09/2016 8  % Final  . Eosinophils Relative 03/09/2016 3  % Final  . Basophils Relative 03/09/2016 0  % Final  . Smear Review 03/09/2016 Criteria for review not met   Final      Imaging: No results found.  Speciality Comments: No specialty comments available.    Procedures:  No procedures performed Allergies: Patient has no known allergies.   Assessment / Plan:     Visit Diagnoses: Sjogren's syndrome -  Positive ANA , positive Ro, positive rheumatoid factor, negative CCP: She continues to have  sicca symptoms . She does notice improvement in her  symptoms with Plaquenil and pilocarpine. - Plan: Urinalysis, Routine w reflex microscopic, Rheumatoid factor, Protein electrophoresis, serum, C3 and C4, Anti-DNA antibody, double-stranded  Raynaud's phenomenon: She has been having increased problems with the weather change. Warm clothing was discussed. I also discussed the option of Norvasc but her blood pressure runs low and she would prefer to wait for right now. I discussed use of aspirin 81 mg enteric-coated every day if tolerated.  Iritis -  Secondary to sicca symptoms, followed up by Dr. Jerre Simon.    High risk medication use - Plaquenil 200 mg by mouth daily, pilocarpine 5 mg 3 times a day normal PLQ eye exam 04/07/2015 - Plan: CBC with Differential/Platelet, Comprehensive Metabolic Panel (CMET). I will check her labs again in 3 months. She states her eye exam this month with Dr. Katy Fitch was normal. I will refill Plaquenil today.  Graves disease - History of thyroiditis and ophthalmopathy. Doing better.  H/O seasonal allergies  Vitamin D deficiency    Orders: Orders Placed This Encounter  Procedures  . CBC with Differential/Platelet  . Urinalysis, Routine w reflex microscopic  . Rheumatoid factor  . Protein electrophoresis, serum  . C3 and C4  . Anti-DNA antibody, double-stranded  . Comprehensive Metabolic Panel (CMET)   No orders of the defined types were placed in this encounter.   Face-to-face time spent with patient was 30 minutes. 50% of time was spent in counseling and coordination of care.  Follow-Up Instructions: Return in about 5 months (around 08/22/2016) for Sjogren's.   Bo Merino, MD

## 2016-03-23 DIAGNOSIS — Z889 Allergy status to unspecified drugs, medicaments and biological substances status: Secondary | ICD-10-CM | POA: Insufficient documentation

## 2016-03-23 DIAGNOSIS — E559 Vitamin D deficiency, unspecified: Secondary | ICD-10-CM | POA: Insufficient documentation

## 2016-03-24 ENCOUNTER — Ambulatory Visit (INDEPENDENT_AMBULATORY_CARE_PROVIDER_SITE_OTHER): Payer: 59 | Admitting: Rheumatology

## 2016-03-24 ENCOUNTER — Encounter: Payer: Self-pay | Admitting: Rheumatology

## 2016-03-24 VITALS — BP 110/64 | HR 88 | Resp 14 | Ht 60.0 in | Wt 140.0 lb

## 2016-03-24 DIAGNOSIS — I7300 Raynaud's syndrome without gangrene: Secondary | ICD-10-CM | POA: Diagnosis not present

## 2016-03-24 DIAGNOSIS — H209 Unspecified iridocyclitis: Secondary | ICD-10-CM

## 2016-03-24 DIAGNOSIS — M3501 Sicca syndrome with keratoconjunctivitis: Secondary | ICD-10-CM

## 2016-03-24 DIAGNOSIS — E0500 Thyrotoxicosis with diffuse goiter without thyrotoxic crisis or storm: Secondary | ICD-10-CM

## 2016-03-24 DIAGNOSIS — E559 Vitamin D deficiency, unspecified: Secondary | ICD-10-CM | POA: Diagnosis not present

## 2016-03-24 DIAGNOSIS — Z889 Allergy status to unspecified drugs, medicaments and biological substances status: Secondary | ICD-10-CM | POA: Diagnosis not present

## 2016-03-24 DIAGNOSIS — Z79899 Other long term (current) drug therapy: Secondary | ICD-10-CM | POA: Diagnosis not present

## 2016-03-24 MED ORDER — HYDROXYCHLOROQUINE SULFATE 200 MG PO TABS: 200.0000 mg | ORAL_TABLET | Freq: Every day | ORAL | 1 refills | Status: DC

## 2016-03-24 NOTE — Patient Instructions (Signed)
Labs due in February.

## 2016-03-31 ENCOUNTER — Other Ambulatory Visit: Payer: 59

## 2016-04-01 ENCOUNTER — Other Ambulatory Visit: Payer: 59

## 2016-04-08 ENCOUNTER — Other Ambulatory Visit (INDEPENDENT_AMBULATORY_CARE_PROVIDER_SITE_OTHER): Payer: 59

## 2016-04-08 DIAGNOSIS — E0500 Thyrotoxicosis with diffuse goiter without thyrotoxic crisis or storm: Secondary | ICD-10-CM | POA: Diagnosis not present

## 2016-04-08 LAB — T4, FREE: Free T4: 1.03 ng/dL (ref 0.60–1.60)

## 2016-04-08 LAB — T3, FREE: T3 FREE: 4.6 pg/mL — AB (ref 2.3–4.2)

## 2016-04-08 LAB — TSH: TSH: 1.53 u[IU]/mL (ref 0.35–4.50)

## 2016-05-10 DIAGNOSIS — H50012 Monocular esotropia, left eye: Secondary | ICD-10-CM | POA: Diagnosis not present

## 2016-05-10 DIAGNOSIS — H5022 Vertical strabismus, left eye: Secondary | ICD-10-CM | POA: Diagnosis not present

## 2016-05-10 DIAGNOSIS — H532 Diplopia: Secondary | ICD-10-CM | POA: Diagnosis not present

## 2016-05-12 ENCOUNTER — Encounter: Payer: Self-pay | Admitting: Internal Medicine

## 2016-05-12 ENCOUNTER — Other Ambulatory Visit (INDEPENDENT_AMBULATORY_CARE_PROVIDER_SITE_OTHER): Payer: 59

## 2016-05-12 DIAGNOSIS — E0500 Thyrotoxicosis with diffuse goiter without thyrotoxic crisis or storm: Secondary | ICD-10-CM

## 2016-05-12 LAB — T4, FREE: FREE T4: 0.81 ng/dL (ref 0.60–1.60)

## 2016-05-12 LAB — TSH: TSH: 1.13 u[IU]/mL (ref 0.35–4.50)

## 2016-05-12 LAB — T3, FREE: T3 FREE: 3.6 pg/mL (ref 2.3–4.2)

## 2016-05-13 ENCOUNTER — Other Ambulatory Visit: Payer: 59

## 2016-05-19 DIAGNOSIS — Z79899 Other long term (current) drug therapy: Secondary | ICD-10-CM | POA: Diagnosis not present

## 2016-05-19 DIAGNOSIS — M3501 Sicca syndrome with keratoconjunctivitis: Secondary | ICD-10-CM | POA: Diagnosis not present

## 2016-05-24 LAB — CBC WITH DIFFERENTIAL/PLATELET
BASOS ABS: 0 10*3/uL (ref 0.0–0.2)
BASOS: 0 %
EOS (ABSOLUTE): 0.4 10*3/uL (ref 0.0–0.4)
Eos: 6 %
Hematocrit: 39.8 % (ref 34.0–46.6)
Hemoglobin: 13.1 g/dL (ref 11.1–15.9)
Immature Grans (Abs): 0 10*3/uL (ref 0.0–0.1)
Immature Granulocytes: 0 %
LYMPHS ABS: 2.7 10*3/uL (ref 0.7–3.1)
Lymphs: 42 %
MCH: 32.9 pg (ref 26.6–33.0)
MCHC: 32.9 g/dL (ref 31.5–35.7)
MCV: 100 fL — AB (ref 79–97)
MONOS ABS: 0.5 10*3/uL (ref 0.1–0.9)
Monocytes: 7 %
NEUTROS ABS: 2.8 10*3/uL (ref 1.4–7.0)
Neutrophils: 45 %
PLATELETS: 303 10*3/uL (ref 150–379)
RBC: 3.98 x10E6/uL (ref 3.77–5.28)
RDW: 13.3 % (ref 12.3–15.4)
WBC: 6.3 10*3/uL (ref 3.4–10.8)

## 2016-05-24 LAB — COMPREHENSIVE METABOLIC PANEL
ALT: 27 IU/L (ref 0–32)
AST: 28 IU/L (ref 0–40)
Albumin/Globulin Ratio: 1.6 (ref 1.2–2.2)
Albumin: 4.8 g/dL (ref 3.5–5.5)
Alkaline Phosphatase: 118 IU/L — ABNORMAL HIGH (ref 39–117)
BUN/Creatinine Ratio: 25 — ABNORMAL HIGH (ref 9–23)
BUN: 15 mg/dL (ref 6–24)
Bilirubin Total: 0.7 mg/dL (ref 0.0–1.2)
CALCIUM: 9.5 mg/dL (ref 8.7–10.2)
CO2: 26 mmol/L (ref 18–29)
CREATININE: 0.61 mg/dL (ref 0.57–1.00)
Chloride: 96 mmol/L (ref 96–106)
GFR, EST AFRICAN AMERICAN: 124 mL/min/{1.73_m2} (ref >59)
GFR, EST NON AFRICAN AMERICAN: 108 mL/min/{1.73_m2} (ref >59)
GLOBULIN, TOTAL: 3 g/dL (ref 1.5–4.5)
Glucose: 97 mg/dL (ref 65–99)
Potassium: 3.8 mmol/L (ref 3.5–5.2)
Sodium: 137 mmol/L (ref 134–144)
TOTAL PROTEIN: 7.8 g/dL (ref 6.0–8.5)

## 2016-05-24 LAB — MICROSCOPIC EXAMINATION
BACTERIA UA: NONE SEEN
Casts: NONE SEEN /lpf

## 2016-05-24 LAB — URINALYSIS, ROUTINE W REFLEX MICROSCOPIC
Bilirubin, UA: NEGATIVE
GLUCOSE, UA: NEGATIVE
KETONES UA: NEGATIVE
Nitrite, UA: NEGATIVE
Protein, UA: NEGATIVE
RBC, UA: NEGATIVE
SPEC GRAV UA: 1.012 (ref 1.005–1.030)
Urobilinogen, Ur: 0.2 mg/dL (ref 0.2–1.0)
pH, UA: 7.5 (ref 5.0–7.5)

## 2016-05-24 LAB — PROTEIN ELECTROPHORESIS, SERUM
A/G Ratio: 1.3 (ref 0.7–1.7)
ALBUMIN ELP: 4.4 g/dL (ref 2.9–4.4)
ALPHA 2: 0.6 g/dL (ref 0.4–1.0)
Alpha 1: 0.2 g/dL (ref 0.0–0.4)
BETA: 1.5 g/dL — AB (ref 0.7–1.3)
GAMMA GLOBULIN: 1.1 g/dL (ref 0.4–1.8)
GLOBULIN, TOTAL: 3.4 g/dL (ref 2.2–3.9)

## 2016-05-24 LAB — RHEUMATOID FACTOR: Rhuematoid fact SerPl-aCnc: 16 IU/mL — ABNORMAL HIGH (ref 0.0–13.9)

## 2016-05-24 LAB — C3 AND C4
Complement C3, Serum: 136 mg/dL (ref 82–167)
Complement C4, Serum: 29 mg/dL (ref 14–44)

## 2016-05-24 LAB — ANTI-DNA ANTIBODY, DOUBLE-STRANDED

## 2016-05-25 ENCOUNTER — Telehealth: Payer: Self-pay | Admitting: Rheumatology

## 2016-05-25 NOTE — Telephone Encounter (Signed)
Cynthia DollyJulia Fox returned your call.  Cb#[phone].

## 2016-05-25 NOTE — Telephone Encounter (Signed)
Attempted to return patient's call and left message for patient to call the office.  

## 2016-05-26 NOTE — Telephone Encounter (Signed)
Patient advised of lab results

## 2016-07-29 ENCOUNTER — Encounter: Payer: Self-pay | Admitting: Internal Medicine

## 2016-07-29 ENCOUNTER — Ambulatory Visit (INDEPENDENT_AMBULATORY_CARE_PROVIDER_SITE_OTHER): Payer: 59 | Admitting: Internal Medicine

## 2016-07-29 VITALS — BP 118/72 | HR 82 | Temp 97.9°F | Resp 16 | Ht 60.0 in | Wt 141.0 lb

## 2016-07-29 DIAGNOSIS — E229 Hyperfunction of pituitary gland, unspecified: Secondary | ICD-10-CM | POA: Diagnosis not present

## 2016-07-29 DIAGNOSIS — E0500 Thyrotoxicosis with diffuse goiter without thyrotoxic crisis or storm: Secondary | ICD-10-CM | POA: Diagnosis not present

## 2016-07-29 DIAGNOSIS — R7989 Other specified abnormal findings of blood chemistry: Secondary | ICD-10-CM

## 2016-07-29 NOTE — Progress Notes (Signed)
Pre visit review using our clinic review tool, if applicable. No additional management support is needed unless otherwise documented below in the visit note. 

## 2016-07-29 NOTE — Progress Notes (Signed)
Patient ID: Cynthia Fox, female   DOB: 12/10/1970, 46 y.o.   MRN: [id number]   HPI  Cynthia Fox is a 46 y.o.-year-old female, initially referred by Dr Lily Peer, returning for Graves ds, Graves ophthalmopathy, and elevated prolactin. Last visit 6 mo ago.  She continues to have double vision (sees Dr. Toni Arthurs >> recommended a possible new sx (orbital decompression, I believe), but not before Graves Abs decrease. Until then, she got corrective glasses (Dr. Venita Lick).  She wears them only when she drives as she cannot turn her head when wearing them.   Graves ds:  Pt had a thyroid uptake in 08/08/2014 (unfortunately, a scan was not done at that time) and this returned at ULN, 28.4% (10-30%).  Lab Results  Component Value Date   TSI 471 (H) 01/29/2016   TSI 247 (H) 08/14/2015   TSI 297 (H) 09/16/2014   We started MMI and the dose was increased to 10 mg in am and 5 mg in pm >> TSH high  (as she did not return for recheck as advised) >> we stopped MMi >> TSH 0.09 >> advised her to restart MMi 5 mg daily >> dose decreased to 2.5 mg in 08/2015. She is off MMI now since 09/2015.  I reviewed pt's thyroid tests: Lab Results  Component Value Date   TSH 1.13 05/12/2016   TSH 1.53 04/08/2016   TSH 0.810 01/29/2016   TSH 1.27 11/11/2015   TSH 4.35 09/29/2015   TSH 3.89 08/14/2015   TSH 3.28 07/10/2015   TSH 0.09 (L) 05/27/2015   TSH 84.61 (H) 04/27/2015   TSH 0.01 (L) 01/12/2015   FREET4 0.81 05/12/2016   FREET4 1.03 04/08/2016   FREET4 1.31 01/29/2016   FREET4 0.82 11/11/2015   FREET4 0.73 09/29/2015   FREET4 0.77 08/14/2015   FREET4 0.88 07/10/2015   FREET4 1.53 05/27/2015   FREET4 0.30 (L) 04/27/2015   FREET4 2.50 (H) 01/12/2015   Graves ophthalmopathy: 05/18/2015:  Called by Dr. Dione Booze: Patient seen in his office recently for regular eye exam while on Plaquenil. She noticed a visual field defect in one of her eyes and had her come back for repeat. The second visual field was  worse. He obtained an MRI of her brain with focus on the pituitary that showed normal brain structures and pituitary gland, however ocular muscles were enlarged. The third visual field was even worse than the previous two. She was referred to [facility] ophthalmology, where she is seeing Dr. Toni Arthurs. She is planning to perform decompression surgery. However, due to the severity of the visual field defects, she recommended pulse steroids with Solu-Medrol. She can do this in our office, however patient would like to have this locally. Dr. Dione Booze called me to see if this can be arranged.   The recommended Solu-Medrol regimen: - 500 mg once a week for 6 weeks, followed by - 250 mg once a week for the next 6 weeks.  I d/w Dr Toni Arthurs and plan was for pt to have decompressive surgery first >> she had this 05/21/2015, and then started the above Solumedrol regimen - last dose 08/2015.   Her double vision persists, but she can now drive with the corrective glasses. She cannot wear them all the time as she cnnot turn her head with them.. Also, her L eye is still looking inward.  Pt also has a h/o an elevated prolactin (normal: 2.8-29.2) - last level normal: Lab Results  Component Value Date   PROLACTIN  28.5 09/25/2015   PROLACTIN 30.9 01/12/2015   PROLACTIN 39.9 07/22/2014   PROLACTIN 40.3 07/16/2014   Component     Latest Ref Rng 09/16/2014  Prolactin, Total      28.0  Prolactin, Monomeric     3.2 - 25.2 ng/mL 23.0  No milk discharge. No breast fulness.   She also has a history of vit D deficiency.   ROS: Constitutional: + weight gain (not per our scale) no fatigue, no subjective hyperthermia/hypothermia Eyes: + blurry vision, no xerophthalmia ENT: no sore throat, no nodules palpated in throat, no dysphagia/odynophagia, no hoarseness Cardiovascular: no CP/SOB/palpitations/no leg swelling Respiratory: no cough/SOB Gastrointestinal: no N/V/D/C Musculoskeletal: no muscle/joint aches Skin: no  rashes Neurological: no tremors/numbness/tingling/dizziness  I reviewed pt's medications, allergies, PMH, social hx, family hx, and changes were documented in the history of present illness. Otherwise, unchanged from my initial visit note.   Past Medical History:  Diagnosis Date  . Compressive optic neuropathy   . Hepatitis A   . Oral candidiasis    hx  . Seasonal allergies   . Sjogren's disease (HCC)    positive ANA nad Ro  . Thyroid eye disease    Past Surgical History:  Procedure Laterality Date  . FUNCTIONAL ENDOSCOPIC SINUS SURGERY Bilateral 05/2015  . Orbital Decompression Bilateral 05/2015    History   Social History  . Marital Status: Married    Spouse Name: N/A  . 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 Smoking status: Never Smoker   . Smokeless tobacco: Never Used  . Alcohol Use: Yes     Comment: weekends   Current Outpatient Prescriptions on File Prior to Visit  Medication Sig Dispense Refill  . Cholecalciferol (VITAMIN D3) 5000 units CAPS Take 1 capsule by mouth 2 (two) times a week.    . hydroxychloroquine (PLAQUENIL) 200 MG tablet Take 1 tablet (200 mg total) by mouth daily. 90 tablet 1  . pilocarpine (SALAGEN) 5 MG tablet TK 1 T PO  TID PRN  3   Current Facility-Administered Medications on File Prior to Visit  Medication Dose Route Frequency Provider Last Rate Last Dose  . methylPREDNISolone sodium succinate (SOLU-MEDROL) injection 500 mg  500 mg Intramuscular Q6H Carlus Pavlov, MD       No Known Allergies   Family History  Problem Relation Age of Onset  . Hypertension Mother   . Retinal detachment Mother   . Thyroid disease Mother   . Diabetes Father   . Thyroid disease Sister   . Cancer - Other Sister     Tonsils  . Heart attack Maternal Grandfather   . Breast cancer Neg Hx   . Colon cancer Neg Hx   - also see history of present illness  PE: BP 118/72 (BP Location: Left Arm,  Patient Position: Sitting, Cuff Size: Normal)   Pulse 82   Temp 97.9 F (36.6 C) (Oral)   Resp 16   Ht 5' (1.524 m)   Wt 141 lb (64 kg)   SpO2 97%   BMI 27.54 kg/m  Body mass index is 27.54 kg/m. Wt Readings from Last 3 Encounters:  07/29/16 141 lb (64 kg)  03/24/16 140 lb (63.5 kg)  01/29/16 140 lb (63.5 kg)   Constitutional: slightly overweight, in NAD Eyes: PERRLA, EOMI, + mild L exophthalmos +  L enotropia ENT: moist mucous membranes, no thyromegaly, no cervical lymphadenopathy Cardiovascular: RRR, No MRG Respiratory:  CTA B Gastrointestinal: abdomen soft, NT, ND, BS+ Musculoskeletal: no deformities, strength intact in all 4 Skin: moist, warm, no rashes Neurological: no tremor with outstretched hands, DTR normal in all 4  ASSESSMENT: 1. Mild Graves ds.  2. Graves ophthalmopathy - had Solumedrol - 500 mg weekly x 6 doses, then 250 mg weekly x 6 doses - last in 08/2015.   3. Elevated prolactin  PLAN:  1. Patient with mild Graves ds, based on the presence of TSI antibodies and her uptake and scan, however with signif. Ophthalmopathy - we started MMI but she was able to come off in 09/2015. Labs remained normal and she does not have thyrotoxic sxs  - Her TSIs were increasing at last check >> we started Selenium 6 mo ago >> will check the TSH, fT3 and fT4 today. Will also add TSI level. - RTC in 6 months, but possibly sooner for repeat labs  2. GO - with significant diplopia, which is not improving >> may need a second surgery (first decompressive surgery in 05/21/2015). Will have appt with Dr. Toni Arthurs in May. - she completed a Solumedrol treatment >> diplopia improved, but now the effect has decreased - continues to wear corrective glasses with driving  3. Elevated PRL - Patient with previously high prolactin levels, normalized - reviewed last levels: normal - her menstrual cycles have stopped >> menopausal - sees ObGyn (Dr Lily Peer) - will recheck PRL level   Office  Visit on 07/29/2016  Component Date Value Ref Range Status  . TSI 07/29/2016 351* <140 % baseline Final   Comment: Thyroid stimulating immunoglobulins (TSI) can engage the TSH receptors resulting in hyperthyroidism in Graves' disease patients. TSI levels can be useful in monitoring the clinical outcome of Graves' disease as well as assessing the potential for hyperthyroidism from maternal-fetal transfer. TSI results greater than or equal to (>=) 140% of the Reference Control are considered positive. NOTE: A serum TSH level greater than 350 micro-International Units/mL can interfere with the TSI bioassay and potentially give false positive results. Patients who are pregnant and are suspected of having hyperthyroidism should have both TSI and human Chorionic Gonadotropin(hCG) tests measured. A serum hCG level greater than 40,625 mIU/mL can interfere with the TSI bioassay and may give false negative results. In these patients it is recommended that a second TSI be obtained when the hCG concentration falls below 40,625 mIU/mL (usually after approximately 20-weeks gestation). The an                          alytical performance characteristics of this assay have been determined by [employer], [place], [place]. The modifications have not been cleared or approved by the FDA. This assay has been validated pursuant to the CLIA regulations and is used for clinical purposes.   . Prolactin 07/29/2016 11.3  ng/mL Final   Comment: Reference Range Adult Female  Non-Pregnant 3.0-30.0 ng/mL  Pregnant 10.0-209.0 ng/mL  Postmenopausal 2.0-20.0 ng/mL     . TSH 07/29/2016 2.80  mIU/L Final   Comment:   Reference Range   > or = 20 Years  0.40-4.50   Pregnancy Range First trimester  0.26-2.66 Second trimester 0.55-2.73 Third trimester  0.43-2.91     . Free T4 07/29/2016 1.1  0.8 - 1.8 ng/dL Final  . T3, Free 16/01/9603 3.0  2.3 - 4.2 pg/mL Final   Message  sent: Dear Ms. Devivo, Your thyroid tests are great, and the Graves antibodies are decreasing.  Prolactin level is also normal. Sincerely, Carlus Pavlov MD  Carlus Pavlov, MD PhD [facility]

## 2016-07-29 NOTE — Patient Instructions (Signed)
Please stay off Methimazole for now.  Please stop at the lab.  Please come back for a follow-up appointment in 6 months.

## 2016-07-30 LAB — T4, FREE: FREE T4: 1.1 ng/dL (ref 0.8–1.8)

## 2016-07-30 LAB — T3, FREE: T3, Free: 3 pg/mL (ref 2.3–4.2)

## 2016-07-30 LAB — TSH: TSH: 2.8 m[IU]/L

## 2016-07-30 LAB — PROLACTIN: Prolactin: 11.3 ng/mL

## 2016-08-02 LAB — THYROID STIMULATING IMMUNOGLOBULIN: TSI: 351 % baseline — ABNORMAL HIGH (ref <140)

## 2016-08-09 DIAGNOSIS — H50012 Monocular esotropia, left eye: Secondary | ICD-10-CM | POA: Diagnosis not present

## 2016-08-09 DIAGNOSIS — H04123 Dry eye syndrome of bilateral lacrimal glands: Secondary | ICD-10-CM | POA: Diagnosis not present

## 2016-08-09 DIAGNOSIS — H5022 Vertical strabismus, left eye: Secondary | ICD-10-CM | POA: Diagnosis not present

## 2016-08-09 DIAGNOSIS — E0500 Thyrotoxicosis with diffuse goiter without thyrotoxic crisis or storm: Secondary | ICD-10-CM | POA: Diagnosis not present

## 2016-08-09 DIAGNOSIS — H5069 Other mechanical strabismus: Secondary | ICD-10-CM | POA: Diagnosis not present

## 2016-08-09 DIAGNOSIS — H2513 Age-related nuclear cataract, bilateral: Secondary | ICD-10-CM | POA: Diagnosis not present

## 2016-08-24 ENCOUNTER — Encounter: Payer: Self-pay | Admitting: Gynecology

## 2016-08-24 NOTE — Progress Notes (Signed)
Office Visit Note  Patient: Cynthia Fox             Date of Birth: October 12, 1970           MRN: [id number]             PCP: Wanda Plump, MD Referring: Wanda Plump, MD Visit Date: 08/30/2016 Occupation: @GUAROCC @    Subjective:  Dry mouth and dry eyes   History of Present Illness: Cynthia Fox is a 46 y.o. female with history of Sjogren's syndrome. She states she continues to have dry mouth and dry eyes. She's been using over-the-counter products for her dry mouth and Restasis for her dry eyes. She is still taking Plaquenil and tolerating it well. The pilocarpine causes increased sweating. She is reduce the dosing to twice a day instead of 3 times a day. Raynauds is better with the weather change. Patient states that she'll be getting eye surgery for strabismus.  Activities of Daily Living:  Patient reports morning stiffness for denies  Patient denies nocturnal pain.  Difficulty dressing/grooming: denies Difficulty climbing stairs: denies Difficulty getting out of chair: denies Difficulty using hands for taps, buttons, cutlery, and/or writing: denies    Review of Systems  Constitutional: Positive for weight gain.  HENT: Positive for mouth dryness.   Eyes: Positive for redness and dryness.  Respiratory: Negative.   Cardiovascular: Positive for irregular heartbeat.  Gastrointestinal: Negative.   Endocrine: Positive for cold intolerance.       Hands turn blue in the winter and in A/C finger tips  Genitourinary: Negative.   Musculoskeletal: Negative.   Skin: Positive for rash.       Rash right 5th MC area for a couple days  Allergic/Immunologic: Negative.   Neurological: Negative.   Hematological: Negative.   Psychiatric/Behavioral: Negative.     PMFS History:  Patient Active Problem List   Diagnosis Date Noted  . H/O seasonal allergies 03/23/2016  . Vitamin D deficiency 03/23/2016  . High risk medication use 03/22/2016  . Insomnia 10/27/2015  . Sjogren's syndrome  (HCC) 06/25/2015  . PCP NOTES >>>>>>>>>>>>>>>>>>>>>> 06/25/2015  . Graves disease 06/01/2015  . Graves' ophthalmopathy 06/01/2015  . Encounter to establish care 06/01/2015  . Hyperprolactinemia (HCC) 01/12/2015  . Elevated antinuclear antibody (ANA) level 07/23/2014    Past Medical History:  Diagnosis Date  . Compressive optic neuropathy   . Hepatitis A   . Oral candidiasis    hx  . Seasonal allergies   . Sjogren's disease (HCC)    positive ANA nad Ro  . Thyroid eye disease     Family History  Problem Relation Age of Onset  . Hypertension Mother   . Retinal detachment Mother   . Thyroid disease Mother   . Diabetes Father   . Thyroid disease Sister   . Cancer - Other Sister        Tonsils  . Heart attack Maternal Grandfather   . Breast cancer Neg Hx   . Colon cancer Neg Hx    Past Surgical History:  Procedure Laterality Date  . FUNCTIONAL ENDOSCOPIC SINUS SURGERY Bilateral 05/2015  . Orbital Decompression Bilateral 05/2015   Social History   Social History Narrative   Original from [place]    Lives w/ husband     Objective: Vital Signs: BP 114/74 (BP Location: Left Arm, Patient Position: Sitting, Cuff Size: Normal)   Pulse 82   Resp 14   Ht 4\' 8"  (1.422 m)   Wt 144  lb (65.3 kg)   BMI 32.28 kg/m    Physical Exam  Constitutional: She is oriented to person, place, and time. She appears well-developed and well-nourished.  HENT:  Head: Normocephalic and atraumatic.  Eyes: Conjunctivae and EOM are normal.  strabismus   Neck: Normal range of motion.  Cardiovascular: Normal rate, regular rhythm, normal heart sounds and intact distal pulses.   Pulmonary/Chest: Effort normal and breath sounds normal.  Abdominal: Soft. Bowel sounds are normal.  Lymphadenopathy:    She has no cervical adenopathy.  Neurological: She is alert and oriented to person, place, and time.  Skin: Skin is warm and dry. Capillary refill takes less than 2 seconds.  Psychiatric: She has a normal  mood and affect. Her behavior is normal.  Nursing note and vitals reviewed.    Musculoskeletal Exam: C-spine and thoracic lumbar spine good range of motion. Shoulder joints elbow joints wrist joints with good range of motion. No synovitis was noted. Hip joints knee joints ankles MTPs PIPs are good range of motion with no synovitis.  CDAI Exam: No CDAI exam completed.    Investigation: Findings:  Eye exam 02/15/2016 normal Macula noted  May 2016:  Comprehensive metabolic panel showed alkaline phosphatase of 123, ALT of 37.  UA showed some bacteria but no white cell count.  Sed rate was normal.  G6PD, immunoglobulins, and SPEP were normal.  Vitamin D was low at 14.  PAN ANCA was negative.  ACE was normal.  Rheumatoid factor 47.  CCP was negative.  Complements were normal.  ENA was positive for Ro antibody.  05/19/2016 CBC normal, CMP normal, UA negative, SPEP negative, C3-C4 normal, RF 16, DS DNA less than 1, TSH normal  Imaging: No results found.  Speciality Comments: No specialty comments available.    Procedures:  No procedures performed Allergies: Patient has no known allergies.   Assessment / Plan:     Visit Diagnoses: Sjogren's syndrome with keratoconjunctivitis sicca (HCC) - Positive ANA, positive Ro, positive rheumatoid factor, negative CCP. Her most recent labs were stable in February 2018. Her next labs will be due in July. She's been doing quite well with pilocarpine and over-the-counter medication combination. She is also using Restasis.  High risk medication use: On Plaquenil. Her eye exam and labs have been stable.  Raynaud's disease without gangrene: Better with the warm weather protective clothing and intake of warm liquids was discussed.  Vitamin D deficiency: On supplement  Primary insomnia: Doing better  History of Graves' disease: Followed by endocrinologist an ophthalmologist. She will be getting eye surgery for diplopia .  H/O seasonal allergies     Orders: No orders of the defined types were placed in this encounter.  No orders of the defined types were placed in this encounter.   Face-to-face time spent with patient was 30 minutes. 50% of time was spent in counseling and coordination of care.  Follow-Up Instructions: Return in about 6 months (around 03/02/2017) for Sjogren's.   Pollyann SavoyShaili Jewel Venditto, MD  Note - This record has been created using [employer] software.  Chart creation errors have been sought, but may not always  have been located. Such creation errors do not reflect on  the standard of medical care.

## 2016-08-30 ENCOUNTER — Ambulatory Visit (INDEPENDENT_AMBULATORY_CARE_PROVIDER_SITE_OTHER): Payer: 59 | Admitting: Rheumatology

## 2016-08-30 ENCOUNTER — Encounter: Payer: Self-pay | Admitting: Rheumatology

## 2016-08-30 VITALS — BP 114/74 | HR 82 | Resp 14 | Ht <= 58 in | Wt 144.0 lb

## 2016-08-30 DIAGNOSIS — I7300 Raynaud's syndrome without gangrene: Secondary | ICD-10-CM

## 2016-08-30 DIAGNOSIS — Z79899 Other long term (current) drug therapy: Secondary | ICD-10-CM

## 2016-08-30 DIAGNOSIS — Z889 Allergy status to unspecified drugs, medicaments and biological substances status: Secondary | ICD-10-CM | POA: Diagnosis not present

## 2016-08-30 DIAGNOSIS — M3501 Sicca syndrome with keratoconjunctivitis: Secondary | ICD-10-CM | POA: Diagnosis not present

## 2016-08-30 DIAGNOSIS — F5101 Primary insomnia: Secondary | ICD-10-CM

## 2016-08-30 DIAGNOSIS — Z8639 Personal history of other endocrine, nutritional and metabolic disease: Secondary | ICD-10-CM | POA: Diagnosis not present

## 2016-08-30 DIAGNOSIS — E559 Vitamin D deficiency, unspecified: Secondary | ICD-10-CM

## 2016-08-30 NOTE — Progress Notes (Signed)
Rheumatology Medication Review by a Pharmacist Does the patient feel that his/her medications are working for him/her?  Yes Has the patient been experiencing any side effects to the medications prescribed?  Yes - patient reports sweating with pilocarpine Does the patient have any problems obtaining medications?  No  Issues to address at subsequent visits: None   Pharmacist comments:  Cynthia Fox is a pleasant 46 yo F who presents for follow up.  She is currently taking hydroxychloroquine 200 mg daily and pilocarpine 5 mg twice daily.  She reports she is not able to take pilocarpine at night due to adverse effect of sweating.  I advised her it was ok to continue twice daily.  Most recent standing labs were on 05/19/16.  Patient reports she has hydroxychloroquine eye exam with Dr. Laruth BouchardGroat's office on 03/23/16 which was normal.  I faxed their office and requested the results of her eye exam.  Patient denies any further questions or concerns regarding her medications at this time.   Cynthia Shookachel Fox, Pharm.D., BCPS, CPP Clinical Pharmacist Pager: [phone] Phone: [phone] 08/30/2016 4:03 PM

## 2016-08-30 NOTE — Patient Instructions (Signed)
Standing Labs We placed an order today for your standing lab work.    Please come back and get your standing labs in July and every 5 months  We have open lab Monday through Friday from 8:30-11:30 AM and 1:30-4 PM at the office of Dr. Arbutus PedShaili Aarianna Hoadley/Naitik Panwala, PA.   The office is located at [address], [address], [place], [place] [address] No appointment is necessary.   Labs are drawn by [facility].  You may receive a bill from [facility] for your lab work. If you have any questions regarding directions or hours of operation,  please call [phone].

## 2016-09-15 DIAGNOSIS — H5022 Vertical strabismus, left eye: Secondary | ICD-10-CM | POA: Diagnosis not present

## 2016-09-15 DIAGNOSIS — H50312 Intermittent monocular esotropia, left eye: Secondary | ICD-10-CM | POA: Diagnosis not present

## 2016-09-15 DIAGNOSIS — H11042 Peripheral pterygium, stationary, left eye: Secondary | ICD-10-CM | POA: Diagnosis not present

## 2016-09-28 ENCOUNTER — Ambulatory Visit (INDEPENDENT_AMBULATORY_CARE_PROVIDER_SITE_OTHER): Payer: 59 | Admitting: Gynecology

## 2016-09-28 ENCOUNTER — Encounter: Payer: Self-pay | Admitting: Gynecology

## 2016-09-28 VITALS — BP 122/78 | Ht <= 58 in | Wt 142.0 lb

## 2016-09-28 DIAGNOSIS — N912 Amenorrhea, unspecified: Secondary | ICD-10-CM

## 2016-09-28 DIAGNOSIS — R232 Flushing: Secondary | ICD-10-CM

## 2016-09-28 DIAGNOSIS — N951 Menopausal and female climacteric states: Secondary | ICD-10-CM

## 2016-09-28 DIAGNOSIS — Z01419 Encounter for gynecological examination (general) (routine) without abnormal findings: Secondary | ICD-10-CM | POA: Diagnosis not present

## 2016-09-28 DIAGNOSIS — R2231 Localized swelling, mass and lump, right upper limb: Secondary | ICD-10-CM | POA: Diagnosis not present

## 2016-09-28 LAB — PREGNANCY, URINE: PREG TEST UR: NEGATIVE

## 2016-09-28 MED ORDER — MEDROXYPROGESTERONE ACETATE 10 MG PO TABS: ORAL_TABLET | ORAL | 4 refills | Status: DC

## 2016-09-28 NOTE — Patient Instructions (Signed)
Perimenopausia  (Perimenopause)  La perimenopausia es el momento en que su cuerpo comienza a pasar a la menopausia (sin menstruación durante 12 meses consecutivos). Es un proceso natural. La perimenopausia puede comenzar entre 2 y 8 años antes de la menopausia y por lo general tiene una duración de 1 año más pasada la menopausia. Durante este tiempo, los ovarios podrían producir un óvulo o no. Los ovarios varían su producción de las hormonas estrógeno y progesterona cada mes. Esto puede causar períodos menstruales irregulares, dificultad para quedar embarazada, hemorragia vaginal entre períodos y síntomas incómodos.  CAUSAS  · Producción irregular de las hormonas ováricas estrógeno y progesterona, y no ovular todos los meses.  · Otras causas son:  ? Tumor de la glándula pituitaria.  ? Enfermedades que afectan los ovarios.  ? Radioterapia.  ? Quimioterapia.  ? Causas desconocidas.  ? Fumar mucho y abusar del consumo de alcohol puede llevar a que la perimenopausia aparezca antes.    SIGNOS Y SÍNTOMAS  · Acaloramiento.  · Sudoración nocturna.  · Períodos menstruales irregulares.  · Disminución del deseo sexual.  · Sequedad vaginal.  · Dolores de cabeza.  · Cambios en el estado de ánimo.  · Depresión.  · Problemas de memoria.  · Irritabilidad.  · Cansancio.  · Aumento de peso.  · Problemas para quedar embarazada.  · Pérdida de células óseas (osteoporosis).  · Comienzo de endurecimiento de las arterias (aterosclerosis).    DIAGNÓSTICO  El médico realizará un diagnóstico en función de su edad, historial de períodos menstruales y síntomas. Le realizarán un examen físico para ver si hay algún cambio en su cuerpo, en especial en sus órganos reproductores. Las pruebas hormonales pueden ser o no útiles según la cantidad de hormonas femeninas que produzca y cuándo las produzca. Sin embargo, podrán realizarse otras pruebas hormonales para detectar otros problemas.  TRATAMIENTO   En algunos casos, no se necesita tratamiento. La decisión acerca de qué tratamiento es necesario durante la perimenopausia deberá realizarse en conjunto con su médico según cómo estén afectando los síntomas a su estilo de vida. Existen varios tratamientos disponibles, como:  · Tratar cada síntoma individual con medicamentos específicos para ese síntoma.  · Algunos medicamentos herbales pueden ayudar en síntomas específicos.  · Psicoterapia.  · Terapia grupal.  INSTRUCCIONES PARA EL CUIDADO EN EL HOGAR  · Controle sus periodos menstruales (cuándo ocurren, qué tan abundantes son, cuánto tiempo pasa entre períodos, y cuánto duran) como también sus síntomas y cuándo comenzaron.  · Tome sólo medicamentos de venta libre o recetados, según las indicaciones del médico.  · Duerma y descanse.  · Haga actividad física.  · Consuma una dieta que contenga calcio (bueno para los huesos) y productos derivados de la soja (actúan como estrógenos).  · No fume.  · Evite las bebidas alcohólicas.  · Tome los suplementos vitamínicos según las indicaciones del médico. En ciertos casos, puede ser de ayuda tomar vitamina E.  · Tome suplementos de calcio y vitamina D para ayudar a prevenir la pérdida ósea.  · En algunos casos la terapia de grupo podrá ayudarla.  · La acupuntura puede ser de ayuda en ciertos casos.    SOLICITE ATENCIÓN MÉDICA SI:  · Tiene preguntas acerca de sus síntomas.  · Necesita ser derivada a un especialista (ginecólogo, psiquiatra, o psicólogo).    SOLICITE ATENCIÓN MÉDICA DE INMEDIATO SI:  · Sufre una hemorragia vaginal abundante.  · Su período menstrual dura más de 8 días.  · Sus períodos son recurrentes   cada menos de 21 días.  · Tiene hemorragias durante las relaciones sexuales.  · Está muy deprimido.  · Siente dolor al orinar.  · Siente dolor de cabeza intenso.  · Tiene problemas de visión.    Esta información no tiene como fin reemplazar el consejo del médico.  Asegúrese de hacerle al médico cualquier pregunta que tenga.  Document Released: 03/28/2005 Document Revised: 01/16/2013 Document Reviewed: 10/25/2012  Elsevier Interactive Patient Education © 2017 [employer]

## 2016-09-28 NOTE — Progress Notes (Signed)
Cynthia Fox 11/24/1970 [id number]   History:    46 y.o.  for annual gyn exam complaining of no menstrual cycle since March of this year. Patient is currently being followed by her endocrinologist Dr.Gherge who is been monitoring her Graves' disease. She is no longer on methimazole. She has had also past history of hyperprolactinemia for which she is on no medication. 2 months ago her TSH and prolactin were checked by the endocrinologist and was normal. She is also been followed by the rheumatologist for her Sjogren's. She is currently on Plaquenil. She denies any nipple discharge or any unusual headaches.   Patient with no past history of any abnormal Pap smears.  Past medical history,surgical history, family history and social history were all reviewed and documented in the EPIC chart.  Gynecologic History Patient's last menstrual period was 04/30/2016 (approximate). Contraception: none Last Pap2016Results were: normal Last mammogram were: Normal but dense 2017   Obstetric History OB History  Gravida Para Term Preterm AB Living  0 0 0 0 0 0  SAB TAB Ectopic Multiple Live Births  0 0 0 0           ROS: A ROS was performed and pertinent positives and negatives are included in the history.  GENERAL: No fevers or chills. HEENT: No change in vision, no earache, sore throat or sinus congestion. NECK: No pain or stiffness. CARDIOVASCULAR: No chest pain or pressure. No palpitations. PULMONARY: No shortness of breath, cough or wheeze. GASTROINTESTINAL: No abdominal pain, nausea, vomiting or diarrhea, melena or bright red blood per rectum. GENITOURINARY: No urinary frequency, urgency, hesitancy or dysuria. MUSCULOSKELETAL: No joint or muscle pain, no back pain, no recent trauma. DERMATOLOGIC: No rash, no itching, no lesions. ENDOCRINE: No polyuria, polydipsia, no heat or cold intolerance. No recent change in weight. HEMATOLOGICAL: No anemia or easy bruising or bleeding. NEUROLOGIC: No  headache, seizures, numbness, tingling or weakness. PSYCHIATRIC: No depression, no loss of interest in normal activity or change in sleep pattern.     Exam: chaperone present  BP 122/78   Ht 4\' 8"  (1.422 m)   Wt 142 lb (64.4 kg)   LMP 04/30/2016 (Approximate)   BMI 31.84 kg/m   Body mass index is 31.84 kg/m.  General appearance : Well developed well nourished female. No acute distress HEENT: Eyes: no retinal hemorrhage or exudates,  Neck supple, trachea midline, no carotid bruits, no thyroidmegaly Lungs: Clear to auscultation, no rhonchi or wheezes, or rib retractions  Heart: Regular rate and rhythm, no murmurs or gallops Breast:E right axillary mobile to have centimeter nodule possibly epidermal inclusion cyst possible enlarged lymph node. Otherwise both breasts were symmetrical in appearance and no palpable masses or tenderness contralateral axillary was negative.Abdomen: no palpable masses or tenderness, no rebound or guarding Extremities: no edema or skin discoloration or tenderness  Pelvic:  Bartholin, Urethra, Skene Glands: Within normal limits             Vagina: No gross lesions or discharge  Cervix: No gross lesions or discharge  Uterusanteverted normal size, shape and consistency, non-tender and mobile  Adnexa  Without masses or tenderness  Anus and perineum  normal   Rectovaginal  normal sphincter tone without palpated masses or tenderness             Hemoccult: Not indicated   Urine pregnancy test negative  Assessment/plan: 46 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 Patient  may be perimenopausal for this reason we'll check an [facility] today and literature information will be provided as well. If her FSH is normalized given a prescription for Provera to take 1 tablet daily for 10 days of the month if she does not have a spontaneous menses every 30-35 days provided  that she doesn't urine pregnancy test at home and negative. No additional blood work as indicated. She is going to be referred to the radiologist as a result of an incidental finding today during the exam or by right axillary mass was noted.  Ok Edwards MD, 4:34 PM 09/28/2016

## 2016-09-29 ENCOUNTER — Telehealth: Payer: Self-pay | Admitting: *Deleted

## 2016-09-29 DIAGNOSIS — N63 Unspecified lump in unspecified breast: Secondary | ICD-10-CM

## 2016-09-29 LAB — FOLLICLE STIMULATING HORMONE: FSH: 97.7 m[IU]/mL

## 2016-09-29 NOTE — Telephone Encounter (Signed)
Appointment on 10/03/16 @ 9:10am pt informed.

## 2016-09-29 NOTE — Telephone Encounter (Signed)
-----   Message from Ok EdwardsJuan H Fernandez, MD sent at 09/28/2016  4:39 PM EDT ----- Cynthia DikeJennifer, please schedule diagnostic mammogram with ultrasound of right breast on this patient with specific attention to right axilla where a 2-1/2 cm mass was noted during exam today.

## 2016-10-03 ENCOUNTER — Other Ambulatory Visit: Payer: Self-pay | Admitting: Gynecology

## 2016-10-03 ENCOUNTER — Ambulatory Visit
Admission: RE | Admit: 2016-10-03 | Discharge: 2016-10-03 | Disposition: A | Discharge status: Home / Self Care | Payer: 59 | Source: Ambulatory Visit | Attending: Gynecology | Admitting: Gynecology

## 2016-10-03 DIAGNOSIS — N63 Unspecified lump in unspecified breast: Secondary | ICD-10-CM

## 2016-10-03 DIAGNOSIS — R922 Inconclusive mammogram: Secondary | ICD-10-CM | POA: Diagnosis not present

## 2016-10-03 DIAGNOSIS — N6489 Other specified disorders of breast: Secondary | ICD-10-CM | POA: Diagnosis not present

## 2016-10-07 ENCOUNTER — Institutional Professional Consult (permissible substitution): Payer: 59 | Admitting: Gynecology

## 2016-10-13 ENCOUNTER — Ambulatory Visit (INDEPENDENT_AMBULATORY_CARE_PROVIDER_SITE_OTHER): Payer: 59 | Admitting: Gynecology

## 2016-10-13 ENCOUNTER — Encounter: Payer: Self-pay | Admitting: Gynecology

## 2016-10-13 VITALS — BP 112/80

## 2016-10-13 DIAGNOSIS — N952 Postmenopausal atrophic vaginitis: Secondary | ICD-10-CM

## 2016-10-13 DIAGNOSIS — Z780 Asymptomatic menopausal state: Secondary | ICD-10-CM | POA: Diagnosis not present

## 2016-10-13 DIAGNOSIS — Z79890 Hormone replacement therapy (postmenopausal): Secondary | ICD-10-CM

## 2016-10-13 MED ORDER — ESTRADIOL 10 MCG VA TABS: 1.0000 | ORAL_TABLET | VAGINAL | 11 refills | Status: DC

## 2016-10-13 NOTE — Patient Instructions (Signed)
Estradiol vaginal tablets Qu es este medicamento? La tableta vaginal de ESTRADIOL se utiliza para ayudar a Asbury Automotive Groupaliviar los sntomas de la irritacin y sequedad vaginal que se presenta en algunas mujeres durante la menopausia. Este medicamento puede ser utilizado para otros usos; si tiene alguna pregunta consulte con su proveedor de atencin mdica o con su farmacutico. MARCAS COMUNES: Vagifem, Rudean HaskellYuvafem Qu le debo informar a mi profesional de la salud antes de tomar este medicamento? Necesitan saber si usted presenta alguno de los siguientes problemas o situaciones: sangrado vaginal anormal enfermedad vascular o cogulos sanguneos cncer de mama, cervical, endometrio, ovario, hgado o uterino demencia diabetes enfermedad de la vescula biliar enfermedad cardiaca o ataque cardiaco reciente presin sangunea alta alto nivel de colesterol altos niveles de calcio en la sangre histerectoma enfermedad renal enfermedad heptica migraas deficiencia de protena C deficiencia de protena S derrame cerebral lupus eritematoso sistmico (LES) fuma tabaco una reaccin alrgica o inusual a los estrgenos, a otras hormonas, a otros medicamentos, alimentos, colorantes o conservantes si est embarazada o buscando quedar embarazada si est amamantando a un beb Cmo debo SLM Corporationutilizar este medicamento? Este medicamento es para Chemical engineerutilizar solamente en la vagina. No lo ingiera por va oral. Lvese y squese las manos antes y despus de usarlo. Lea atentamente las instrucciones del envase. Desenvuelva el paquete del aplicador. Asegrese de utilizar un aplicador nuevo para cada dosis. Utilcelo a la Manufacturing systems engineermisma hora cada da. Si la tableta ha cado fuera del aplicador, pero todava est en el paquete, colquela con cuidado en el aplicador. Si la tableta ha cado del paquete, debe tirar ese aplicador y Chemical engineerutilizar un aplicador nuevo que contenga una nueva tableta. Recustese de espaldas, separe y flexione las rodillas. Introduzca suavemente el  aplicador lo mximo posible, hasta donde le resulte cmodo, en la vagina. Luego, presione suavemente el Applied Materialsmbolo hasta el final del recorrido. Esto liberar la tableta en la vagina. Despus, retire English as a second language teachersuavemente el aplicador. Deseche el aplicador despus de usarlo. No utilice el medicamento con una frecuencia mayor a la indicada. No deje de usarlo excepto si as lo indica su mdico o su profesional de Beazer Homesla salud. Hable con su pediatra para informarse acerca del uso de este medicamento en nios. Este medicamento no est aprobado para uso en nios. Recibir un folleto de informacin para el paciente con cada receta y relleno. Asegrese de leer este folleto cada vez cuidadosamente. Este folleto puede cambiar con frecuencia. Sobredosis: Pngase en contacto inmediatamente con un centro toxicolgico o una sala de urgencia si usted cree que haya tomado demasiado medicamento. ATENCIN: Reynolds AmericanEste medicamento es solo para usted. No comparta este medicamento con nadie. Qu sucede si me olvido de una dosis? Si olvida una dosis, sela lo antes posible. Si es casi la hora de la prxima dosis, use slo esa dosis. No use dosis adicionales o dobles. Qu puede interactuar con este medicamento? No tome esta medicina con ninguno de los siguientes medicamentos: -inhibidores de Chiropodistla aromatasa, tales como aminoglutetimida, anastrozol, exemestno, letrozol, testolactona Esta medicina tambin puede interactuar con los siguientes medicamentos: -algunos antibiticos utilizados para tratar la tuberculosis, tales como rifabutina, rifampicina o rifapentina -raloxifeno o tamoxifeno -warfarina Puede ser que esta lista no menciona todas las posibles interacciones. Informe a su profesional de Beazer Homesla salud de [employer] Inctodos los productos a base de hierbas, medicamentos de [place] Beachventa libre o suplementos nutritivos que est tomando. Si usted fuma, consume bebidas alcohlicas o si utiliza drogas ilegales, indqueselo tambin a su profesional de Beazer Homesla salud. Algunas  sustancias pueden interactuar con su medicamento.  A qu debo estar atento al usar PPL Corporation? Visite a su profesional de la salud para Insurance underwriter. Deber hacerse exmenes de las mamas y la pelvis en forma regular. Tambin debe hablar con su profesional de la salud sobre la necesidad de Product manager peridicamente y seguir las recomendaciones que este profesional establezca para estas pruebas. Este medicamento puede hacer que su cuerpo retenga lquido, lo que puede provocar que se le hinchen los dedos, manos o tobillos. Su presin sangunea Manufacturing engineer. Comunquese con su mdico o con su profesional de la salud si siente que est reteniendo lquido. Si tiene algn motivo para pensar que est embarazada, deje de usar este medicamento de inmediato y comunquese con su mdico o con su profesional de Radiographer, therapeutic. El hbito de fumar tabaco aumenta el riesgo de formacin de cogulos o de sufrir un derrame cerebral, especialmente si tiene ms de 35 aos de edad. Se le recomienda enfticamente que no fume. Si [place] lentes de contacto y observa cambios en la visin, o si los lentes comienzan a resultarle incmodos, consulte con su especialista en los ojos. Si va a someterse a Associate Professor, tal vez deba dejar de tomar este medicamento de antemano. Consulte con su profesional de la salud antes de Nurse, learning disability operacin. Qu efectos secundarios puedo tener al [employer] este medicamento? Efectos secundarios que debe informar a su mdico o a Producer, television/film/video de la salud tan pronto como sea posible: -Therapist, art como erupcin cutnea, picazn o urticarias, hinchazn de la cara, labios o lengua -secreciones o cambios en el tejido de las mamas -cambios en la visin -dolor en el pecho -confusin, dificultad para hablar o entender -orina de color amarillo oscuro -sensacin general de estar enfermo o sntomas gripales -heces claras -nuseas o vmito -dolor,  hinchazn, sensacin clida en las piernas -dolor en la regin abdominal superior derecha -dolores de cabeza severos -falta de aliento -debilidad o entumecimiento repentino de la cara, brazos o piernas -problemas para caminar, mareos, prdida de la coordinacin o equilibrio -sangrado vaginal inusual -color amarillento de los ojos o la piel Efectos secundarios que, por lo general, no requieren Psychologist, prison and probation services (debe informarlos a su mdico o a Producer, television/film/video de la salud si persisten o si son molestos): -cada del cabello -aumento de apetito o sed -aumento de descargas de orina -sntomas de una infeccin vaginal, como picazn, irritacin o flujo inusual -cansancio o debilidad inusual Puede ser que esta lista no menciona todos los posibles efectos secundarios. Comunquese a su mdico por asesoramiento mdico Hewlett-[employer]ard. Usted puede informar los efectos secundarios a la FDA por telfono al 1-800-FDA-1088. Dnde debo guardar mi medicina? Mantngala fuera del alcance de los nios. Gurdela a San[employer]-SCI, entre 15 y 30 grados C (35 y 65 grados F). Deseche todo el medicamento que no haya utilizado, despus de la fecha de vencimiento. ATENCIN: Este folleto es un resumen. Puede ser que no cubra toda la posible informacin. Si usted tiene preguntas acerca de esta medicina, consulte con su mdico, su farmacutico o su profesional de Radiographer, therapeutic.  2018 Elsevier/Gold Standard (2016-04-28 00:00:00) Vaginitis atrfica (Atrophic Vaginitis) La vaginitis atrfica es una afeccin en la que los tejidos que recubren la vagina se secan y [place]. Esta afeccin es ms frecuente en las mujeres que ya no tienen perodos menstruales regulares (menopausia). Esto suele comenzar cuando la mujer tiene entre 45 y 32aos. El estrgeno ayuda a Pharmacologist la humedad de la vagina, ya que la estimula para producir  un lquido transparente que lubrica la vagina durante las relaciones sexuales. Este lquido  tambin protege a la vagina contra las infecciones. La falta de estrgeno puede hacer que el recubrimiento de la vagina se afine y se seque. El tamao de la vagina tambin puede disminuir y perder elasticidad. La vaginitis atrfica tiende a Product/process development scientist a medida que el nivel de estrgeno desciende. CAUSAS La causa de esta afeccin es el descenso normal del nivel de estrgeno que se produce en torno a la menopausia. FACTORES DE RIESGO Determinadas afecciones o situaciones pueden reducir [employer] de estrgeno, lo que aumenta el riesgo de que una mujer presente vaginitis atrfica. Estos incluyen los siguientes:  Tomar un medicamento que detenga la produccin de [place].  Someterse a [place] de extraccin de ovarios.  Recibir tratamiento contra el cncer mediante rayos X (radiacin) o medicamentos (quimioterapia).  Hacer ejercicios muy intensos y con Animal nutritionist.  Tener un trastorno alimenticio (anorexia).  Dar a Tree surgeon.  Tener ms de 50 aos.  Fumar. SNTOMAS Los sntomas de esta afeccin incluyen lo siguiente:  Engineer, mining, irritacin o sangrado durante las relaciones sexuales (dispareunia).  Ardor, irritacin o picazn vaginal.  Dolor o sangrado durante un examen vaginal con espculo (examen plvico).  Falta de inters en las [employer].  Ardor al Beatrix Shipper.  Flujo vaginal marrn o amarillo. En algunos casos no hay sntomas. DIAGNSTICO Esta afeccin se diagnostica mediante la historia clnica y un examen fsico. Este incluye un examen plvico que verifica si los tejidos de la parte interna de la vagina se ven plidos, delgados o secos. En contadas ocasiones, tambin Aon Corporation, Branch los siguientes:  Anlisis de [place].  Un estudio que verifica el equilibrio de cido en el lquido vaginal (estudio de equilibrio de cido). TRATAMIENTO El tratamiento de esta afeccin puede depender de la gravedad de los sntomas. El tratamiento  puede incluir lo siguiente:  Uso de un lubricante vaginal de venta libre antes de las relaciones sexuales.  Uso de una crema humectante vaginal de accin prolongada.  Uso de estrgeno vaginal de dosis bajas para los sntomas de moderados a graves que no responden a otros tratamientos. Las opciones incluyen cremas, tabletas y anillos vaginales. Antes de usar estrgeno vaginal, dgale al mdico si tiene antecedentes de: ? Cncer de mama. ? Cncer de endometrio. ? Cogulos sanguneos.  Tomar medicamentos. Probablemente pueda tomar un comprimido diario para la dispareunia. Analice todos los riesgos de este medicamento con el mdico. Por lo general, no se recomienda en mujeres con antecedentes personales o familiares de cncer de mama. Si sus sntomas son muy leves y usted no [employer], es posible que no necesite tratamiento. INSTRUCCIONES PARA EL CUIDADO EN EL HOGAR  Tome los medicamentos solamente como se lo haya indicado el mdico. No utilice medicamentos alternativos o a base de hierbas a menos que el mdico se lo autorice.  Utilice cremas, lubricantes o cremas humectantes de venta libre para la sequedad vaginal solamente como se lo haya indicado el mdico.  Si la causa de la vaginitis atrfica es la menopausia, analice todos sus sntomas y opciones de tratamiento con el mdico.  No se haga duchas vaginales.  No use productos que puedan causarle sequedad vaginal. Estos incluyen los siguientes: ? Aerosoles femeninos perfumados. ? Tampones perfumados. ? Jabones perfumados.  Hable con su pareja sexual si el coito le resulta doloroso. SOLICITE ATENCIN MDICA SI:  El flujo vaginal tiene un aspecto diferente del normal.  Detecta  que tiene un Autoliv vagina.  Aparecen nuevos sntomas.  Los sntomas no mejoran con Scientist, research (medical).  Los sntomas empeoran. Esta informacin no tiene Theme park manager el consejo del mdico. Asegrese de hacerle al mdico  cualquier pregunta que tenga. Document Released: 08/12/2014 Document Revised: 08/12/2014 Document Reviewed: 03/19/2014 Elsevier Interactive Patient Education  [employer].

## 2016-10-13 NOTE — Progress Notes (Signed)
Patient ID: Cynthia Fox, female   DOB: 06/15/1970, 46 y.o.   MRN: [id number]    Patient is a 46 year old who was seen in the office for her annual exam June 25 this year see previous note for details. She had complained she had not had a menstrual cycle since March of this year. She has very mild of any vasomotor symptoms. She is here to discuss a result of [facility] LPFSH which was found to be elevated at 97.7 she did have a negative urine pregnancy test since she has not been using any form of contraception.Dr. Lafe GarinGherge her medical endocrinologist has been following her for her Luiz BlareGraves' disease she is no longer on her methimazole and she stated that that caused her to have hot flashes and she is not having any now very few and in between. Her rheumatologist had been following her for her Sjogren's. Patient recently had a right diagnostic mammogram because of an axillary nodule that was noted which was suspicious for a lipoma and as with the radiologist describes on his imaging findings.  Patient states she had her menarche at the age of 46 her mother had menopause before the age of 48. We had a lengthy discussion on the menopause as well as the perimenopause as well as hormonal replacement therapy. We discussed the women's health initiative study and the knee was guidelines. We discussed the different routes of administration of hormone replacement therapy and we discussed the risks benefits and pros and cons to include DVT, pulmonary embolism breast cancer.  Patient been complaining at times is dyspareunia from vaginal dryness but her vasomotor symptoms. Following in between she would like to hold off on starting on any hormone replacement therapy at this time with the exception that she is receptive to use Vagifem 10 g intravaginally twice a week. The risks benefits and pros and cons were discussed and literature information was provided in [place]. I've also instructed her to buy at the health food store  peppermint oil which she can apply 1-2 dab behind each ear when necessary. I have also recommended she use asked her glide that she can buy over-the-counter to use when she has intercourse. She will monitor her symptoms for now and she'll return back to the office next year for annual exam or when necessary.  Greater than 90% of time was spent counseling [name] for this patient. Time of consultation 15 minutes

## 2016-10-18 ENCOUNTER — Other Ambulatory Visit: Payer: Self-pay | Admitting: *Deleted

## 2016-10-18 MED ORDER — HYDROXYCHLOROQUINE SULFATE 200 MG PO TABS: 200.0000 mg | ORAL_TABLET | Freq: Every day | ORAL | 1 refills | Status: DC

## 2016-10-18 NOTE — Telephone Encounter (Signed)
Refill request received via fax  Last Visit: 08/30/16 Next Visit: 02/28/17 Labs: 05/19/16 WNL PLQ Eye Exam: 03/23/16 WNL  Okay to refill per Dr. Corliss Skainseveshwar

## 2016-10-24 ENCOUNTER — Ambulatory Visit (INDEPENDENT_AMBULATORY_CARE_PROVIDER_SITE_OTHER): Payer: 59 | Admitting: Internal Medicine

## 2016-10-24 ENCOUNTER — Encounter: Payer: Self-pay | Admitting: Internal Medicine

## 2016-10-24 VITALS — BP 108/62 | HR 90 | Temp 97.9°F | Resp 14 | Ht <= 58 in | Wt 142.1 lb

## 2016-10-24 DIAGNOSIS — Z114 Encounter for screening for human immunodeficiency virus [HIV]: Secondary | ICD-10-CM

## 2016-10-24 DIAGNOSIS — Z0000 Encounter for general adult medical examination without abnormal findings: Secondary | ICD-10-CM | POA: Diagnosis not present

## 2016-10-24 NOTE — Progress Notes (Signed)
Subjective:    Patient ID: Cynthia Fox, female    DOB: [birth year], 46 y.o.   MRN: [id number]  DOS:  10/24/2016 Type of visit - description : cpx Interval history: She sees a number for doctors regularly for her chronic medical problems   Review of Systems Has been unable to see a dentist lately mostly due to cost. History of night sweats, was diagnosed as peri-menopausal, on HRT. Has right-sided back pain on and off, and this is going on since at least a year, usually worse there days she does a lot of movement and work at home. No radiation.   Other than above, a 14 point review of systems is negative     Past Medical History:  Diagnosis Date  . Compressive optic neuropathy   . Hepatitis A   . Seasonal allergies   . Sjogren's disease (HCC)    positive ANA nad Ro  . Thyroid eye disease     Past Surgical History:  Procedure Laterality Date  . FUNCTIONAL ENDOSCOPIC SINUS SURGERY Bilateral 05/2015  . Orbital Decompression Bilateral 05/2015    Social History   Social History  . Marital status: Married    Spouse name: N/A  . Number of children: 0  . 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 Smoking status: Never Smoker  . Smokeless tobacco: Never Used  . Alcohol use 0.0 oz/week     Comment: weekends  . Drug use: No  . Sexual activity: Yes   Other Topics Concern  . Not on file   Social History Narrative   Original from [place]    Lives w/ husband     Family History  Problem Relation Age of Onset  . Hypertension Mother   . Retinal detachment Mother   . Thyroid disease Mother   . Diabetes Father   . Thyroid disease Sister   . Cancer - Other Sister        Tonsils  . Heart attack Maternal Grandfather   . Breast cancer Neg Hx   . Colon cancer Neg Hx      Allergies as of 10/24/2016   No Known Allergies     Medication List       Accurate as of 10/24/16 11:59 PM. Always use your most recent med  list.          Estradiol 10 MCG Tabs vaginal tablet Place 1 tablet (10 mcg total) vaginally 2 (two) times a week.   hydroxychloroquine 200 MG tablet Commonly known as:  PLAQUENIL Take 1 tablet (200 mg total) by mouth daily.   pilocarpine 5 MG tablet Commonly known as:  SALAGEN TK 1 T PO  TID PRN   Selenium 200 MCG Caps Take 200 mcg by mouth daily.   Vitamin D3 5000 units Caps Take 1 capsule by mouth 2 (two) times a week.          Objective:   Physical Exam BP 108/62 (BP Location: Left Arm, Patient Position: Sitting, Cuff Size: Small)   Pulse 90   Temp 97.9 F (36.6 C) (Oral)   Resp 14   Ht 4\' 8"  (1.422 m)   Wt 142 lb 2 oz (64.5 kg)   LMP 05/16/2016   SpO2 95%   BMI 31.86 kg/m   General:   Well developed, well nourished . NAD.  Neck: No  thyromegaly  HEENT:  Normocephalic . Face symmetric, atraumatic Lungs:  CTA B Normal respiratory effort, no intercostal retractions, no accessory muscle use. Heart: RRR,  no murmur.  No pretibial edema bilaterally  Abdomen:  Not distended, soft, non-tender. No rebound or rigidity.   MSK: No TTP at lumbar or thoracic spine. Skin: Exposed areas without rash. Not pale. Not jaundice Neurologic:  alert & oriented X3.  Speech normal, gait appropriate for age and unassisted Strength symmetric and appropriate for age.  Psych: Cognition and judgment appear intact.  Cooperative with normal attention span and concentration.  Behavior appropriate. No anxious or depressed appearing.    Assessment & Plan:   Assessment ENDO:    Dr Elvera LennoxGherghe --Luiz BlareGraves' disease dx 2016, ophthalmopathy dx ~ 04-2015 , had diplopia, s/p orbital decompression surgery ~ 05-2015 >> persistent diplopia  --h/o hyperprolactinemia  RHEUM:  Dr Victory Dakinavenshwar  Sjogren syndrome  Dx 2016  --  + ANA Menopausal   PLAN: Here for CPX. Doing well Graves' disease, hyperprolactinemia: Per endocrinology Sjogren's syndrome: Per Dr. Victory Dakinavenshwar, good dental hygiene and see  the dentist regularly strongly recommended. RTC one year

## 2016-10-24 NOTE — Patient Instructions (Signed)
GO TO THE FRONT DESK Schedule your next appointment for a    Schedule labs to be done this week, fasting     Back Exercises If you have pain in your back, do these exercises 2-3 times each day or as told by your doctor. When the pain goes away, do the exercises once each day, but repeat the steps more times for each exercise (do more repetitions). If you do not have pain in your back, do these exercises once each day or as told by your doctor. Exercises Single Knee to Chest  Do these steps 3-5 times in a row for each leg: 1. Lie on your back on a firm bed or the floor with your legs stretched out. 2. Bring one knee to your chest. 3. Hold your knee to your chest by grabbing your knee or thigh. 4. Pull on your knee until you feel a gentle stretch in your lower back. 5. Keep doing the stretch for 10-30 seconds. 6. Slowly let go of your leg and straighten it.  Pelvic Tilt  Do these steps 5-10 times in a row: 1. Lie on your back on a firm bed or the floor with your legs stretched out. 2. Bend your knees so they point up to the ceiling. Your feet should be flat on the floor. 3. Tighten your lower belly (abdomen) muscles to press your lower back against the floor. This will make your tailbone point up to the ceiling instead of pointing down to your feet or the floor. 4. Stay in this position for 5-10 seconds while you gently tighten your muscles and breathe evenly.  Cat-Cow  Do these steps until your lower back bends more easily: 1. Get on your hands and knees on a firm surface. Keep your hands under your shoulders, and keep your knees under your hips. You may put padding under your knees. 2. Let your head hang down, and make your tailbone point down to the floor so your lower back is round like the back of a cat. 3. Stay in this position for 5 seconds. 4. Slowly lift your head and make your tailbone point up to the ceiling so your back hangs low (sags) like the back of a cow. 5. Stay  in this position for 5 seconds.  Press-Ups  Do these steps 5-10 times in a row: 1. Lie on your belly (face-down) on the floor. 2. Place your hands near your head, about shoulder-width apart. 3. While you keep your back relaxed and keep your hips on the floor, slowly straighten your arms to raise the top half of your body and lift your shoulders. Do not use your back muscles. To make yourself more comfortable, you may change where you place your hands. 4. Stay in this position for 5 seconds. 5. Slowly return to lying flat on the floor.  Bridges  Do these steps 10 times in a row: 1. Lie on your back on a firm surface. 2. Bend your knees so they point up to the ceiling. Your feet should be flat on the floor. 3. Tighten your butt muscles and lift your butt off of the floor until your waist is almost as high as your knees. If you do not feel the muscles working in your butt and the back of your thighs, slide your feet 1-2 inches farther away from your butt. 4. Stay in this position for 3-5 seconds. 5. Slowly lower your butt to the floor, and let your butt muscles relax.  If this exercise is too easy, try doing it with your arms crossed over your chest. Belly Crunches  Do these steps 5-10 times in a row: 1. Lie on your back on a firm bed or the floor with your legs stretched out. 2. Bend your knees so they point up to the ceiling. Your feet should be flat on the floor. 3. Cross your arms over your chest. 4. Tip your chin a little bit toward your chest but do not bend your neck. 5. Tighten your belly muscles and slowly raise your chest just enough to lift your shoulder blades a tiny bit off of the floor. 6. Slowly lower your chest and your head to the floor.  Back Lifts Do these steps 5-10 times in a row: 1. Lie on your belly (face-down) with your arms at your sides, and rest your forehead on the floor. 2. Tighten the muscles in your legs and your butt. 3. Slowly lift your chest off of  the floor while you keep your hips on the floor. Keep the back of your head in line with the curve in your back. Look at the floor while you do this. 4. Stay in this position for 3-5 seconds. 5. Slowly lower your chest and your face to the floor.  Contact a doctor if:  Your back pain gets a lot worse when you do an exercise.  Your back pain does not lessen 2 hours after you exercise. If you have any of these problems, stop doing the exercises. Do not do them again unless your doctor says it is okay. Get help right away if:  You have sudden, very bad back pain. If this happens, stop doing the exercises. Do not do them again unless your doctor says it is okay. This information is not intended to replace advice given to you by your health care provider. Make sure you discuss any questions you have with your health care provider. Document Released: 04/30/2010 Document Revised: 09/03/2015 Document Reviewed: 05/22/2014 Elsevier Interactive Patient Education  [employer]2018 Elsevier Inc.

## 2016-10-24 NOTE — Assessment & Plan Note (Signed)
-  Td 2014 per patient -Female care by gynecology, last Pap smear 07/2014, next mammogram 01-2017 --Never had a colonoscopy; new guidelines suggest  early screening, no FH colon ca. Three options discussed, she is going to have eye surgery soon, not the right time to do a colonoscopy, we agreed on an IFOB -labs: FLP, HIV. Noted recent CBC w/ a elevated MCV: check a B12 and folic acid. Also A.P. was a slightly high, will check LFTs and GGT. RTC 1 year

## 2016-10-24 NOTE — Progress Notes (Signed)
Pre visit review using our clinic review tool, if applicable. No additional management support is needed unless otherwise documented below in the visit note. 

## 2016-10-25 ENCOUNTER — Other Ambulatory Visit (INDEPENDENT_AMBULATORY_CARE_PROVIDER_SITE_OTHER): Payer: 59

## 2016-10-25 DIAGNOSIS — Z0000 Encounter for general adult medical examination without abnormal findings: Secondary | ICD-10-CM

## 2016-10-25 DIAGNOSIS — Z114 Encounter for screening for human immunodeficiency virus [HIV]: Secondary | ICD-10-CM

## 2016-10-25 LAB — LIPID PANEL
CHOL/HDL RATIO: 3
Cholesterol: 151 mg/dL (ref 0–200)
HDL: 53.2 mg/dL (ref >39.00)
LDL CALC: 63 mg/dL (ref 0–99)
NONHDL: 97.62
TRIGLYCERIDES: 171 mg/dL — AB (ref 0.0–149.0)
VLDL: 34.2 mg/dL (ref 0.0–40.0)

## 2016-10-25 LAB — FOLATE: FOLATE: 9.3 ng/mL (ref >5.9)

## 2016-10-25 LAB — HEPATIC FUNCTION PANEL
ALBUMIN: 4.2 g/dL (ref 3.5–5.2)
ALT: 25 U/L (ref 0–35)
AST: 27 U/L (ref 0–37)
Alkaline Phosphatase: 84 U/L (ref 39–117)
BILIRUBIN TOTAL: 0.9 mg/dL (ref 0.2–1.2)
Bilirubin, Direct: 0.2 mg/dL (ref 0.0–0.3)
Total Protein: 7 g/dL (ref 6.0–8.3)

## 2016-10-25 LAB — VITAMIN B12: VITAMIN B 12: 470 pg/mL (ref 211–911)

## 2016-10-25 LAB — GAMMA GT: GGT: 58 U/L — AB (ref 7–51)

## 2016-10-25 NOTE — Assessment & Plan Note (Signed)
Here for CPX. Doing well Graves' disease, hyperprolactinemia: Per endocrinology Sjogren's syndrome: Per Dr. Victory Dakinavenshwar, good dental hygiene and see the dentist regularly strongly recommended. RTC one year

## 2016-10-26 ENCOUNTER — Encounter: Payer: 59 | Admitting: Internal Medicine

## 2016-10-26 LAB — HIV ANTIBODY (ROUTINE TESTING W REFLEX): HIV SCREEN 4TH GENERATION: NONREACTIVE

## 2016-10-27 ENCOUNTER — Telehealth: Payer: Self-pay | Admitting: Internal Medicine

## 2016-10-27 NOTE — Telephone Encounter (Signed)
Pt mentioned at appt that she was having surgery on Friday 10/28/2016 and her eye MD would be calling for results, and verbalized okay to release. Results printed and faxed to number provided.

## 2016-10-27 NOTE — Telephone Encounter (Signed)
Caller name: Roe CoombsDon Relation to pt: [facility] Call back number:Fax [phone] tel [phone] Pharmacy:  Reason for call: Roe CoombsDon (employee from [facility]) called from Dr Henri MedalEric Hein office stating that they are awaiting for pt last cpe results, since pt is having surgery tomorrow. Needing this ASAP.

## 2016-10-28 DIAGNOSIS — H50312 Intermittent monocular esotropia, left eye: Secondary | ICD-10-CM | POA: Diagnosis not present

## 2016-10-28 DIAGNOSIS — H5022 Vertical strabismus, left eye: Secondary | ICD-10-CM | POA: Diagnosis not present

## 2016-10-28 DIAGNOSIS — E0500 Thyrotoxicosis with diffuse goiter without thyrotoxic crisis or storm: Secondary | ICD-10-CM | POA: Diagnosis not present

## 2016-11-11 ENCOUNTER — Other Ambulatory Visit (INDEPENDENT_AMBULATORY_CARE_PROVIDER_SITE_OTHER): Payer: 59

## 2016-11-11 DIAGNOSIS — Z0000 Encounter for general adult medical examination without abnormal findings: Secondary | ICD-10-CM

## 2016-11-11 LAB — FECAL OCCULT BLOOD, IMMUNOCHEMICAL: FECAL OCCULT BLD: NEGATIVE

## 2016-11-14 ENCOUNTER — Telehealth: Payer: Self-pay | Admitting: Rheumatology

## 2016-11-14 DIAGNOSIS — Z79899 Other long term (current) drug therapy: Secondary | ICD-10-CM

## 2016-11-14 NOTE — Telephone Encounter (Signed)
[place] [facility] [place] [address] [place] on Wednesday. Please release orders.

## 2016-11-14 NOTE — Telephone Encounter (Signed)
Labs released and faxed  

## 2016-11-21 ENCOUNTER — Other Ambulatory Visit: Payer: Self-pay | Admitting: Rheumatology

## 2016-11-21 DIAGNOSIS — Z79899 Other long term (current) drug therapy: Secondary | ICD-10-CM | POA: Diagnosis not present

## 2016-11-22 LAB — CBC WITH DIFFERENTIAL/PLATELET
BASOS ABS: 0 10*3/uL (ref 0.0–0.2)
Basos: 1 %
EOS (ABSOLUTE): 0.2 10*3/uL (ref 0.0–0.4)
Eos: 4 %
Hematocrit: 36.3 % (ref 34.0–46.6)
Hemoglobin: 12.1 g/dL (ref 11.1–15.9)
IMMATURE GRANS (ABS): 0 10*3/uL (ref 0.0–0.1)
Immature Granulocytes: 0 %
LYMPHS: 37 %
Lymphocytes Absolute: 2.1 10*3/uL (ref 0.7–3.1)
MCH: 33.2 pg — AB (ref 26.6–33.0)
MCHC: 33.3 g/dL (ref 31.5–35.7)
MCV: 100 fL — AB (ref 79–97)
MONOS ABS: 0.5 10*3/uL (ref 0.1–0.9)
Monocytes: 8 %
Neutrophils Absolute: 2.8 10*3/uL (ref 1.4–7.0)
Neutrophils: 50 %
PLATELETS: 281 10*3/uL (ref 150–379)
RBC: 3.64 x10E6/uL — ABNORMAL LOW (ref 3.77–5.28)
RDW: 13.6 % (ref 12.3–15.4)
WBC: 5.7 10*3/uL (ref 3.4–10.8)

## 2016-11-22 LAB — CMP14+EGFR
A/G RATIO: 1.6 (ref 1.2–2.2)
ALK PHOS: 104 IU/L (ref 39–117)
ALT: 18 IU/L (ref 0–32)
AST: 23 IU/L (ref 0–40)
Albumin: 4.3 g/dL (ref 3.5–5.5)
BILIRUBIN TOTAL: 0.6 mg/dL (ref 0.0–1.2)
BUN/Creatinine Ratio: 20 (ref 9–23)
BUN: 15 mg/dL (ref 6–24)
CHLORIDE: 105 mmol/L (ref 96–106)
CO2: 23 mmol/L (ref 20–29)
Calcium: 8.9 mg/dL (ref 8.7–10.2)
Creatinine, Ser: 0.75 mg/dL (ref 0.57–1.00)
GFR calc non Af Amer: 95 mL/min/{1.73_m2} (ref >59)
GFR, EST AFRICAN AMERICAN: 109 mL/min/{1.73_m2} (ref >59)
GLUCOSE: 90 mg/dL (ref 65–99)
Globulin, Total: 2.7 g/dL (ref 1.5–4.5)
POTASSIUM: 4.7 mmol/L (ref 3.5–5.2)
Sodium: 140 mmol/L (ref 134–144)
Total Protein: 7 g/dL (ref 6.0–8.5)

## 2016-11-22 LAB — VITAMIN D 25 HYDROXY (VIT D DEFICIENCY, FRACTURES): VIT D 25 HYDROXY: 37.3 ng/mL (ref 30.0–100.0)

## 2016-11-22 NOTE — Telephone Encounter (Signed)
Labs are stable.

## 2016-11-25 ENCOUNTER — Telehealth: Payer: Self-pay | Admitting: Radiology

## 2016-11-25 NOTE — Telephone Encounter (Signed)
Refill request received via fax for Pilocarpine [facility] [place]

## 2016-11-28 MED ORDER — PILOCARPINE HCL 5 MG PO TABS: 5.0000 mg | ORAL_TABLET | Freq: Three times a day (TID) | ORAL | 1 refills | Status: DC | PRN

## 2016-11-28 NOTE — Progress Notes (Signed)
Vit D normal. Should stay on Vit d supplement.

## 2016-11-28 NOTE — Telephone Encounter (Signed)
08/30/16 last visit  03/15/17 next visit Ok to refill per Dr Corliss Skains

## 2016-12-07 DIAGNOSIS — H5022 Vertical strabismus, left eye: Secondary | ICD-10-CM | POA: Diagnosis not present

## 2016-12-07 DIAGNOSIS — H50012 Monocular esotropia, left eye: Secondary | ICD-10-CM | POA: Diagnosis not present

## 2016-12-07 DIAGNOSIS — Z9889 Other specified postprocedural states: Secondary | ICD-10-CM | POA: Diagnosis not present

## 2016-12-07 DIAGNOSIS — R293 Abnormal posture: Secondary | ICD-10-CM | POA: Diagnosis not present

## 2017-01-03 ENCOUNTER — Other Ambulatory Visit: Payer: Self-pay | Admitting: Internal Medicine

## 2017-01-03 DIAGNOSIS — Z1231 Encounter for screening mammogram for malignant neoplasm of breast: Secondary | ICD-10-CM

## 2017-01-10 ENCOUNTER — Ambulatory Visit (HOSPITAL_BASED_OUTPATIENT_CLINIC_OR_DEPARTMENT_OTHER)
Admission: RE | Admit: 2017-01-10 | Discharge: 2017-01-10 | Disposition: A | Discharge status: Home / Self Care | Payer: 59 | Source: Ambulatory Visit | Attending: Internal Medicine | Admitting: Internal Medicine

## 2017-01-10 DIAGNOSIS — Z1231 Encounter for screening mammogram for malignant neoplasm of breast: Secondary | ICD-10-CM | POA: Insufficient documentation

## 2017-01-30 ENCOUNTER — Ambulatory Visit: Payer: 59 | Admitting: Internal Medicine

## 2017-02-01 ENCOUNTER — Encounter: Payer: Self-pay | Admitting: Internal Medicine

## 2017-02-01 ENCOUNTER — Ambulatory Visit (INDEPENDENT_AMBULATORY_CARE_PROVIDER_SITE_OTHER): Payer: 59 | Admitting: Internal Medicine

## 2017-02-01 ENCOUNTER — Ambulatory Visit: Payer: 59 | Admitting: Internal Medicine

## 2017-02-01 VITALS — BP 110/78 | HR 74 | Wt 141.0 lb

## 2017-02-01 DIAGNOSIS — E221 Hyperprolactinemia: Secondary | ICD-10-CM | POA: Diagnosis not present

## 2017-02-01 DIAGNOSIS — E0500 Thyrotoxicosis with diffuse goiter without thyrotoxic crisis or storm: Secondary | ICD-10-CM

## 2017-02-01 LAB — T3, FREE: T3, Free: 3.2 pg/mL (ref 2.3–4.2)

## 2017-02-01 LAB — TSH: TSH: 1.6 u[IU]/mL (ref 0.35–4.50)

## 2017-02-01 LAB — T4, FREE: Free T4: 0.84 ng/dL (ref 0.60–1.60)

## 2017-02-01 NOTE — Patient Instructions (Signed)
Please stop at the lab.  Continue Selenium 200 mcg daily.  Please stay off Methimazole.  Please come back for a follow-up appointment in 6 months.

## 2017-02-01 NOTE — Progress Notes (Signed)
Patient ID: Cynthia Fox, female   DOB: 1970-05-29, 46 y.o.   MRN: [id number]   HPI  [name] is a 46 y.o.-year-old female, initially referred by Dr Lily Peer, returning for Graves ds, Graves ophthalmopathy, and elevated prolactin. Last visit 6 mo ago.  She has Graves ophthalmopathy (sees Dr. Toni Arthurs) >> had strabismus sx (10/28/2016) >> much better vision (now a little double vision only when looks to the left). Does not need the prism glasses anymore.  Graves ds:  Reviewed hx: Pt had a thyroid uptake in 08/08/2014 (unfortunately, a scan was not done at that time) and this returned at ULN, 28.4% (10-30%).  We started MMI and the dose was increased to 10 mg in am and 5 mg in pm >> TSH high  (as she did not return for recheck as advised) >> we stopped MMi >> TSH 0.09 >> advised her to restart MMi 5 mg daily >> dose decreased to 2.5 mg in 08/2015. She is off MMI now since 09/2015.  Lab Results  Component Value Date   TSI 351 (H) 07/29/2016   TSI 471 (H) 01/29/2016   TSI 247 (H) 08/14/2015   TSI 297 (H) 09/16/2014   I reviewed pt's thyroid tests: Lab Results  Component Value Date   TSH 2.80 07/29/2016   TSH 1.13 05/12/2016   TSH 1.53 04/08/2016   TSH 0.810 01/29/2016   TSH 1.27 11/11/2015   TSH 4.35 09/29/2015   TSH 3.89 08/14/2015   TSH 3.28 07/10/2015   TSH 0.09 (L) 05/27/2015   TSH 84.61 (H) 04/27/2015   FREET4 1.1 07/29/2016   FREET4 0.81 05/12/2016   FREET4 1.03 04/08/2016   FREET4 1.31 01/29/2016   FREET4 0.82 11/11/2015   FREET4 0.73 09/29/2015   FREET4 0.77 08/14/2015   FREET4 0.88 07/10/2015   FREET4 1.53 05/27/2015   FREET4 0.30 (L) 04/27/2015   Graves ophthalmopathy: 05/18/2015:  Called by Dr. Dione Booze: Patient seen in his office recently for regular eye exam while on Plaquenil. She noticed a visual field defect in one of her eyes and had her come back for repeat. The second visual field was worse. He obtained an MRI of her brain with focus on the pituitary  that showed normal brain structures and pituitary gland, however ocular muscles were enlarged. The third visual field was even worse than the previous two. She was referred to [facility] ophthalmology, where she is seeing Dr. Toni Arthurs. She is planning to perform decompression surgery. However, due to the severity of the visual field defects, she recommended pulse steroids with Solu-Medrol. She can do this in our office, however patient would like to have this locally. Dr. Dione Booze called me to see if this can be arranged.   The recommended Solu-Medrol regimen: - 500 mg once a week for 6 weeks, followed by - 250 mg once a week for the next 6 weeks.  I d/w Dr Toni Arthurs and plan was for pt to have decompressive surgery first >> she had this 05/21/2015, and then started the above Solumedrol regimen - last dose 08/2015.   She had a lot of problems with double vision and at last visit, she could only drive with corrective glasses. Since last visit, she had strabismus surgery 3 months ago and this was successful so she is now only seeing double vision when she looks to the left, but this is much better compared to previous.  Pt also has a h/o an elevated prolactin (normal: 2.8-29.2) - last level normal: Lab  Results  Component Value Date   PROLACTIN 11.3 07/29/2016   PROLACTIN 28.5 09/25/2015   PROLACTIN 30.9 01/12/2015   PROLACTIN 39.9 07/22/2014   PROLACTIN 40.3 07/16/2014   Component     Latest Ref Rng 09/16/2014  Prolactin, Total      28.0  Prolactin, Monomeric     3.2 - 25.2 ng/mL 23.0   No galactorrhea.  ROS: Constitutional: no weight gain/no weight loss, no fatigue, no subjective hyperthermia, no subjective hypothermia Eyes: no blurry vision, no xerophthalmia ENT: no sore throat, no nodules palpated in throat, no dysphagia, no odynophagia, no hoarseness Cardiovascular: no CP/no SOB/no palpitations/no leg swelling Respiratory: no cough/no SOB/no wheezing Gastrointestinal: no N/no V/no D/no  C/no acid reflux Musculoskeletal: no muscle aches/no joint aches Skin: no rashes, no hair loss Neurological: no tremors/no numbness/no tingling/no dizziness  I reviewed pt's medications, allergies, PMH, social hx, family hx, and changes were documented in the history of present illness. Otherwise, unchanged from my initial visit note.   Past Medical History:  Diagnosis Date  . Compressive optic neuropathy   . Hepatitis A   . Seasonal allergies   . Sjogren's disease (HCC)    positive ANA nad Ro  . Thyroid eye disease    Past Surgical History:  Procedure Laterality Date  . FUNCTIONAL ENDOSCOPIC SINUS SURGERY Bilateral 05/2015  . Orbital Decompression Bilateral 05/2015    History   Social History  . Marital Status: Married    Spouse Name: N/A  . 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 Smoking status: Never Smoker   . Smokeless tobacco: Never Used  . Alcohol Use: Yes     Comment: weekends   Current Outpatient Prescriptions on File Prior to Visit  Medication Sig Dispense Refill  . Cholecalciferol (VITAMIN D3) 5000 units CAPS Take 1 capsule by mouth 2 (two) times a week.    . Estradiol 10 MCG TABS vaginal tablet Place 1 tablet (10 mcg total) vaginally 2 (two) times a week. 8 tablet 11  . hydroxychloroquine (PLAQUENIL) 200 MG tablet Take 1 tablet (200 mg total) by mouth daily. 90 tablet 1  . pilocarpine (SALAGEN) 5 MG tablet Take 1 tablet (5 mg total) by mouth 3 (three) times daily as needed. 270 tablet 1  . Selenium 200 MCG CAPS Take 200 mcg by mouth daily.     No current facility-administered medications on file prior to visit.    No Known Allergies   Family History  Problem Relation Age of Onset  . Hypertension Mother   . Retinal detachment Mother   . Thyroid disease Mother   . Diabetes Father   . Thyroid disease Sister   . Cancer - Other Sister        Tonsils  . Heart attack Maternal Grandfather   .  Breast cancer Neg Hx   . Colon cancer Neg Hx   - also see history of present illness  PE: BP 110/78 (BP Location: Left Arm, Patient Position: Sitting, Cuff Size: Normal)   Pulse 74   Wt 141 lb (64 kg)   SpO2 99%   BMI 31.61 kg/m  Body mass index is 31.61 kg/m. Wt Readings from Last 3 Encounters:  02/01/17 141 lb (64 kg)  10/24/16 142 lb 2 oz (64.5 kg)  09/28/16 142 lb (64.4 kg)   Constitutional: slightly overweight, in NAD Eyes: PERRLA, EOMI, + mild L exophthalmos +  Very mid L  enotropia ENT: moist mucous membranes, no thyromegaly, no cervical lymphadenopathy Cardiovascular: RRR, No MRG Respiratory: CTA B Gastrointestinal: abdomen soft, NT, ND, BS+ Musculoskeletal: no deformities, strength intact in all 4 Skin: moist, warm, no rashes Neurological: no tremor with outstretched hands, DTR normal in all 4  ASSESSMENT: 1. Mild Graves ds.  2. Graves ophthalmopathy - had Solumedrol - 500 mg weekly x 6 doses, then 250 mg weekly x 6 doses - last in 08/2015.   3. Elevated prolactin  PLAN:  1. Patient with mild Graves ds. based on the presence of TSI antibodies and her uptake and scan, however, with significant Graves ophthalmopathy - at last visit, TSI level was still high, but improving - we started MMI but she was able to come off in 09/2015; labs remained stable after he stops the medication and she is asymptomatic. - We started Selenium 1 year ago >> TSI started to decrease >> will continue - today will check the TSH, fT3 and fT4 today; we will check a TSI level at next visi - RTC in 6 months   2. GO - with significant diplopia >> now improved after strabismus surgery on 10/28/2016 (she previously had decompressive surgery in 05/21/2015).  3. Elevated PRL - patient with previously high prolactin levels, now normalized (at last visit, PRL was 11). Of note, she does not have macro prolactin (see history of present illness) - she sees ObGyn (Dr Lily PeerFernandez) - she is now  menopausal - will recheck PRL level at next visit  Carlus Pavlovristina Emmett Bracknell, MD PhD [facility]

## 2017-02-28 ENCOUNTER — Ambulatory Visit: Payer: 59 | Admitting: Rheumatology

## 2017-03-03 NOTE — Progress Notes (Signed)
Office Visit Note  Patient: Cynthia AllegraJulia A Kartes             Date of Birth: 02/05/1971           MRN: [id number]             PCP: Wanda PlumpPaz, Jose E, MD Referring: Wanda PlumpPaz, Jose E, MD Visit Date: 03/15/2017 Occupation: @GUAROCC @    Subjective:  Other (dry mouth )   History of Present Illness: Cynthia Fox is a 46 y.o. female with history of Sjogren's and Raynaud's phenomenon. She states she continues to have sicca symptoms. Dry mouth is more bothersome than dry eyes. She's been taking Plaquenil which she's been tolerating. She states she had surgery for diplopia. Her symptoms have improved. She's been having Raynaud's due to cold weather.  Activities of Daily Living:  Patient reports morning stiffness for 0 minute.   Patient Denies nocturnal pain.  Difficulty dressing/grooming: Denies Difficulty climbing stairs: Denies Difficulty getting out of chair: Denies Difficulty using hands for taps, buttons, cutlery, and/or writing: Denies   Review of Systems  Constitutional: Negative for fatigue, night sweats, weight gain, weight loss and weakness.  HENT: Positive for mouth dryness. Negative for mouth sores, trouble swallowing, trouble swallowing and nose dryness.   Eyes: Positive for dryness. Negative for pain, redness and visual disturbance.  Respiratory: Negative for cough, shortness of breath and difficulty breathing.   Cardiovascular: Negative for chest pain, palpitations, hypertension, irregular heartbeat and swelling in legs/feet.  Gastrointestinal: Negative for blood in stool, constipation and diarrhea.  Endocrine: Negative for increased urination.  Genitourinary: Negative for vaginal dryness.  Musculoskeletal: Negative for arthralgias, joint pain, joint swelling, myalgias, muscle weakness, morning stiffness, muscle tenderness and myalgias.  Skin: Positive for color change. Negative for rash, hair loss, skin tightness, ulcers and sensitivity to sunlight.  Allergic/Immunologic: Negative for  susceptible to infections.  Neurological: Negative for dizziness, memory loss and night sweats.  Hematological: Negative for swollen glands.  Psychiatric/Behavioral: Negative for depressed mood and sleep disturbance. The patient is not nervous/anxious.     PMFS History:  Patient Active Problem List   Diagnosis Date Noted  . 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 Annual physical exam 10/24/2016  . Menopause 10/13/2016  . H/O seasonal allergies 03/23/2016  . Vitamin D deficiency 03/23/2016  . High risk medication use 03/22/2016  . Insomnia 10/27/2015  . Sjogren's syndrome (HCC) 06/25/2015  . PCP NOTES >>>>>>>>>>>>>>>>>>>>>> 06/25/2015  . Graves disease 06/01/2015  . Hyperprolactinemia (HCC) 01/12/2015  . Elevated antinuclear antibody (ANA) level 07/23/2014    Past Medical History:  Diagnosis Date  . Compressive optic neuropathy   . Hepatitis A   . Seasonal allergies   . Sjogren's disease (HCC)    positive ANA nad Ro  . Thyroid eye disease     Family History  Problem Relation Age of Onset  . Hypertension Mother   . Retinal detachment Mother   . Thyroid disease Mother   . Diabetes Father   . Thyroid disease Sister   . Heart attack Maternal Grandfather   . Breast cancer Neg Hx   . Colon cancer Neg Hx    Past Surgical History:  Procedure Laterality Date  . EYE SURGERY Bilateral 2017, 2018   release of optic pressure   . FUNCTIONAL ENDOSCOPIC SINUS SURGERY Bilateral 05/2015  . Orbital Decompression Bilateral 05/2015   Social History   Social History Narrative   Original from [place]    Lives w/ husband     Objective: Vital  Signs: BP 112/72 (BP Location: Left Arm, Patient Position: Sitting, Cuff Size: Normal)   Pulse 79   Resp 14   Ht 5' (1.524 m)   Wt 144 lb (65.3 kg)   BMI 28.12 kg/m    Physical Exam  Constitutional: She is oriented to person, place, and time. She appears well-developed and well-nourished.  HENT:  Head: Normocephalic and atraumatic.  Eyes:  Conjunctivae and EOM are normal.  Neck: Normal range of motion.  Cardiovascular: Normal rate, regular rhythm, normal heart sounds and intact distal pulses.  Pulmonary/Chest: Effort normal and breath sounds normal.  Abdominal: Soft. Bowel sounds are normal.  Lymphadenopathy:    She has no cervical adenopathy.  Neurological: She is alert and oriented to person, place, and time.  Skin: Skin is warm and dry. Capillary refill takes less than 2 seconds.  Psychiatric: She has a normal mood and affect. Her behavior is normal.  Nursing note and vitals reviewed.    Musculoskeletal Exam: C-spine and thoracic lumbar spine good range of motion. Shoulder joints elbow joints wrist joint MCPs PIPs DIPs are good range of motion with no synovitis. Hip joints knee joints ankles MTPs PIPs DIPs are good range of motion with no synovitis.  CDAI Exam: No CDAI exam completed.    Investigation: No additional findings.PLQ eye exam: 03/23/2016 CBC Latest Ref Rng & Units 11/21/2016 05/19/2016 03/09/2016  WBC 3.4 - 10.8 x10E3/uL 5.7 6.3 6.8  Hemoglobin 11.1 - 15.9 g/dL 16.1 09.6 04.5  Hematocrit 34.0 - 46.6 % 36.3 39.8 38.4  Platelets 150 - 379 x10E3/uL 281 303 297   CMP Latest Ref Rng & Units 11/21/2016 10/25/2016 05/19/2016  Glucose 65 - 99 mg/dL 90 - 97  BUN 6 - 24 mg/dL 15 - 15  Creatinine 4.09 - 1.00 mg/dL 8.11 - 9.14  Sodium 782 - 144 mmol/L 140 - 137  Potassium 3.5 - 5.2 mmol/L 4.7 - 3.8  Chloride 96 - 106 mmol/L 105 - 96  CO2 20 - 29 mmol/L 23 - 26  Calcium 8.7 - 10.2 mg/dL 8.9 - 9.5  Total Protein 6.0 - 8.5 g/dL 7.0 7.0 7.8  Total Bilirubin 0.0 - 1.2 mg/dL 0.6 0.9 0.7  Alkaline Phos 39 - 117 IU/L 104 84 118(H)  AST 0 - 40 IU/L 23 27 28   ALT 0 - 32 IU/L 18 25 27     Imaging: No results found.  Speciality Comments: No specialty comments available.   Procedures:  No procedures performed Allergies: Patient has no known allergies.   Assessment / Plan:     Visit Diagnoses: Sjogren's syndrome with  keratoconjunctivitis sicca (HCC) - Positive ANA, positive Ro, positive rheumatoid factor, negative CCP. She continues to have sicca symptoms. She's been using pilocarpine and Plaquenil which is been effective. I will obtain following labs to monitor for disease activity.- Plan: Urinalysis, Routine w reflex microscopic, Rheumatoid factor, Serum protein electrophoresis with reflex  High risk medication use - PLQeye exam: 03/23/2016 - Plan: CBC with Differential/Platelet, COMPLETE METABOLIC PANEL WITH GFR today.  Raynaud's disease without gangrene. She's been complaining of increasing Raynaud's phenomenon during the wintertime. She states her hands turn bluish and very cold. We discussed the option of adding amlodipine 2.5 mg at bedtime. Side effects including hypotension and dizziness were discussed. She will try some when necessary basis and see if she can tolerate. I've also advised to monitor blood pressure closely. - Plan: Anti-DNA antibody, double-stranded, C3 and C4  Primary insomnia: Good sleep hygiene discussed.  History of vitamin  D deficiency - On supplement  History of Graves' disease - she hasn't really well after her recent eye surgery.  H/O seasonal allergies    Orders: Orders Placed This Encounter  Procedures  . CBC with Differential/Platelet  . COMPLETE METABOLIC PANEL WITH GFR  . Urinalysis, Routine w reflex microscopic  . Rheumatoid factor  . Serum protein electrophoresis with reflex  . Anti-DNA antibody, double-stranded  . C3 and C4   Meds ordered this encounter  Medications  . amLODipine (NORVASC) 2.5 MG tablet    Sig: Take 1 tablet (2.5 mg total) by mouth at bedtime.    Dispense:  30 tablet    Refill:  2     Follow-Up Instructions: Return in about 5 months (around 08/13/2017) for Sjogren's.   Pollyann SavoyShaili Tobby Fawcett, MD  Note - This record has been created using [employer]agon software.  Chart creation errors have been sought, but may not always  have been located. Such  creation errors do not reflect on  the standard of medical care.

## 2017-03-15 ENCOUNTER — Ambulatory Visit: Payer: 59 | Admitting: Rheumatology

## 2017-03-15 ENCOUNTER — Encounter: Payer: Self-pay | Admitting: Rheumatology

## 2017-03-15 VITALS — BP 112/72 | HR 79 | Resp 14 | Ht 60.0 in | Wt 144.0 lb

## 2017-03-15 DIAGNOSIS — Z8639 Personal history of other endocrine, nutritional and metabolic disease: Secondary | ICD-10-CM

## 2017-03-15 DIAGNOSIS — Z889 Allergy status to unspecified drugs, medicaments and biological substances status: Secondary | ICD-10-CM | POA: Diagnosis not present

## 2017-03-15 DIAGNOSIS — M3501 Sicca syndrome with keratoconjunctivitis: Secondary | ICD-10-CM | POA: Diagnosis not present

## 2017-03-15 DIAGNOSIS — I7300 Raynaud's syndrome without gangrene: Secondary | ICD-10-CM

## 2017-03-15 DIAGNOSIS — F5101 Primary insomnia: Secondary | ICD-10-CM

## 2017-03-15 DIAGNOSIS — Z79899 Other long term (current) drug therapy: Secondary | ICD-10-CM

## 2017-03-15 MED ORDER — AMLODIPINE BESYLATE 2.5 MG PO TABS: 2.5000 mg | ORAL_TABLET | Freq: Every day | ORAL | 2 refills | Status: DC

## 2017-03-16 NOTE — Progress Notes (Signed)
WNL

## 2017-03-17 LAB — COMPLETE METABOLIC PANEL WITH GFR
AG Ratio: 1.5 (calc) (ref 1.0–2.5)
ALT: 17 U/L (ref 6–29)
AST: 19 U/L (ref 10–35)
Albumin: 4.5 g/dL (ref 3.6–5.1)
Alkaline phosphatase (APISO): 95 U/L (ref 33–115)
BILIRUBIN TOTAL: 1.1 mg/dL (ref 0.2–1.2)
BUN: 12 mg/dL (ref 7–25)
CALCIUM: 9.6 mg/dL (ref 8.6–10.2)
CHLORIDE: 100 mmol/L (ref 98–110)
CO2: 30 mmol/L (ref 20–32)
Creat: 0.95 mg/dL (ref 0.50–1.10)
GFR, EST AFRICAN AMERICAN: 82 mL/min/{1.73_m2} (ref >=60)
GFR, EST NON AFRICAN AMERICAN: 71 mL/min/{1.73_m2} (ref >=60)
GLOBULIN: 3 g/dL (ref 1.9–3.7)
Glucose, Bld: 91 mg/dL (ref 65–99)
Potassium: 4.1 mmol/L (ref 3.5–5.3)
SODIUM: 137 mmol/L (ref 135–146)
Total Protein: 7.5 g/dL (ref 6.1–8.1)

## 2017-03-17 LAB — RHEUMATOID FACTOR: RHEUMATOID FACTOR: 17 [IU]/mL — AB (ref <14)

## 2017-03-17 LAB — PROTEIN ELECTROPHORESIS, SERUM, WITH REFLEX
ALBUMIN ELP: 4.6 g/dL (ref 3.8–4.8)
Alpha 1: 0.3 g/dL (ref 0.2–0.3)
Alpha 2: 0.6 g/dL (ref 0.5–0.9)
BETA 2: 0.5 g/dL (ref 0.2–0.5)
Beta Globulin: 0.6 g/dL (ref 0.4–0.6)
GAMMA GLOBULIN: 1.2 g/dL (ref 0.8–1.7)
TOTAL PROTEIN: 7.8 g/dL (ref 6.1–8.1)

## 2017-03-17 LAB — ANTI-DNA ANTIBODY, DOUBLE-STRANDED: ds DNA Ab: <1 IU/mL

## 2017-03-17 LAB — CBC WITH DIFFERENTIAL/PLATELET
BASOS ABS: 50 {cells}/uL (ref 0–200)
Basophils Relative: 0.8 %
EOS ABS: 221 {cells}/uL (ref 15–500)
Eosinophils Relative: 3.5 %
HEMATOCRIT: 41.8 % (ref 35.0–45.0)
Hemoglobin: 14.2 g/dL (ref 11.7–15.5)
LYMPHS ABS: 2400 {cells}/uL (ref 850–3900)
MCH: 33.9 pg — AB (ref 27.0–33.0)
MCHC: 34 g/dL (ref 32.0–36.0)
MCV: 99.8 fL (ref 80.0–100.0)
MPV: 12 fL (ref 7.5–12.5)
Monocytes Relative: 8.8 %
NEUTROS PCT: 48.8 %
Neutro Abs: 3074 cells/uL (ref 1500–7800)
Platelets: 271 10*3/uL (ref 140–400)
RBC: 4.19 10*6/uL (ref 3.80–5.10)
RDW: 12.7 % (ref 11.0–15.0)
TOTAL LYMPHOCYTE: 38.1 %
WBC mixed population: 554 cells/uL (ref 200–950)
WBC: 6.3 10*3/uL (ref 3.8–10.8)

## 2017-03-17 LAB — URINALYSIS, ROUTINE W REFLEX MICROSCOPIC
BACTERIA UA: NONE SEEN /HPF
BILIRUBIN URINE: NEGATIVE
Glucose, UA: NEGATIVE
Hgb urine dipstick: NEGATIVE
Hyaline Cast: NONE SEEN /LPF
KETONES UR: NEGATIVE
NITRITE: NEGATIVE
PH: 7.5 (ref 5.0–8.0)
Protein, ur: NEGATIVE
RBC / HPF: NONE SEEN /HPF (ref 0–2)
Specific Gravity, Urine: 1.015 (ref 1.001–1.03)

## 2017-03-17 LAB — C3 AND C4
C3 COMPLEMENT: 117 mg/dL (ref 83–193)
C4 COMPLEMENT: 29 mg/dL (ref 15–57)

## 2017-04-22 ENCOUNTER — Other Ambulatory Visit: Payer: Self-pay | Admitting: Rheumatology

## 2017-04-24 NOTE — Telephone Encounter (Signed)
Patient has an appointment to update eye exam in April 2019

## 2017-04-24 NOTE — Telephone Encounter (Signed)
Last Visit: 03/15/17 Next Visit: 08/15/17 Labs: 03/15/17 WNL PLQ Eye Exam: 03/23/16  WNL  Left message to advise patient we need a updated PLQ eye exam  Okay to refill 30 day supply per Dr.Deveshwar

## 2017-05-03 ENCOUNTER — Ambulatory Visit: Payer: 59 | Admitting: Physician Assistant

## 2017-05-03 ENCOUNTER — Ambulatory Visit (INDEPENDENT_AMBULATORY_CARE_PROVIDER_SITE_OTHER): Payer: 59

## 2017-05-03 ENCOUNTER — Encounter: Payer: Self-pay | Admitting: Physician Assistant

## 2017-05-03 VITALS — BP 110/77 | HR 90 | Temp 98.2°F | Resp 18 | Ht 58.5 in | Wt 143.0 lb

## 2017-05-03 DIAGNOSIS — M545 Low back pain, unspecified: Secondary | ICD-10-CM

## 2017-05-03 DIAGNOSIS — M542 Cervicalgia: Secondary | ICD-10-CM

## 2017-05-03 DIAGNOSIS — T148XXA Other injury of unspecified body region, initial encounter: Secondary | ICD-10-CM

## 2017-05-03 MED ORDER — MELOXICAM 7.5 MG PO TABS: 7.5000 mg | ORAL_TABLET | Freq: Every day | ORAL | 0 refills | Status: DC

## 2017-05-03 MED ORDER — CYCLOBENZAPRINE HCL 10 MG PO TABS: 10.0000 mg | ORAL_TABLET | Freq: Three times a day (TID) | ORAL | 1 refills | Status: DC | PRN

## 2017-05-03 NOTE — Patient Instructions (Addendum)
- Your muscle pain may get worse before it gets better.  - Stretch your neck and back 1-2 times daily. (See below) If needed, you can google other back/neck stretches. Certain mild yoga poses are also helpful.  - Flexeril is a muscle relaxer. If this makes you drowsy, only take it at night. - Meloxicam is an NSAID. Do not use with any other otc pain medication other than tylenol/acetaminophen - so no aleve, ibuprofen, motrin, advil, etc. You can take both of these medications together - they do not interact. - Stay well hydrated. Water is like lubrication for your muscles. Drink 1-2 liters of water daily.  - Massage your muscles. You can use a tennis ball against a wall to massage your back.  - Soak in a hot bath.  - Come back and see me if you are not improving in 2-3 weeks. We can do trigger point injections.  - Come back sooner if your symptoms worsen.   Thank you for coming in today. I hope you feel we met your needs. Feel free to call PCP if you have any questions or further requests. Please consider signing up for MyChart if you do not already have it, as this is a great way to communicate with me.  Best,  Mercer Pod, PA-C  Cervical Strain and Sprain Rehab Ask your health care provider which exercises are safe for you. Do exercises exactly as told by your health care provider and adjust them as directed. It is normal to feel mild stretching, pulling, tightness, or discomfort as you do these exercises, but you should stop right away if you feel sudden pain or your pain gets worse.Do not begin these exercises until told by your health care provider. Stretching and range of motion exercises These exercises warm up your muscles and joints and improve the movement and flexibility of your neck. These exercises also help to relieve pain, numbness, and tingling. Exercise A: Cervical side bend  1. Using good posture, sit on a stable chair or stand up. 2. Without moving your shoulders, slowly  tilt your left / right ear to your shoulder until you feel a stretch in your neck muscles. You should be looking straight ahead. 3. Hold for __________ seconds. 4. Repeat with the other side of your neck. Repeat __________ times. Complete this exercise __________ times a day. Exercise B: Cervical rotation  1. Using good posture, sit on a stable chair or stand up. 2. Slowly turn your head to the side as if you are looking over your left / right shoulder. ? Keep your eyes level with the ground. ? Stop when you feel a stretch along the side and the back of your neck. 3. Hold for __________ seconds. 4. Repeat this by turning to your other side. Repeat __________ times. Complete this exercise __________ times a day. Exercise C: Thoracic extension and pectoral stretch 1. Roll a towel or a small blanket so it is about 4 inches (10 cm) in diameter. 2. Lie down on your back on a firm surface. 3. Put the towel lengthwise, under your spine in the middle of your back. It should not be not under your shoulder blades. The towel should line up with your spine from your middle back to your lower back. 4. Put your hands behind your head and let your elbows fall out to your sides. 5. Hold for __________ seconds. Repeat __________ times. Complete this exercise __________ times a day.  Posture and body mechanics  Body  mechanics refers to the movements and positions of your body while you do your daily activities. Posture is part of body mechanics. Good posture and healthy body mechanics can help to relieve stress in your body's tissues and joints. Good posture means that your spine is in its natural S-curve position (your spine is neutral), your shoulders are pulled back slightly, and your head is not tipped forward. The following are general guidelines for applying improved posture and body mechanics to your everyday activities. Standing  When standing, keep your spine neutral and keep your feet about  hip-width apart. Keep a slight bend in your knees. Your ears, shoulders, and hips should line up.  When you do a task in which you stand in one place for a long time, place one foot up on a stable object that is 2-4 inches (5-10 cm) high, such as a footstool. This helps keep your spine neutral. Sitting   When sitting, keep your spine neutral and your keep feet flat on the floor. Use a footrest, if necessary, and keep your thighs parallel to the floor. Avoid rounding your shoulders, and avoid tilting your head forward.  When working at a desk or a computer, keep your desk at a height where your hands are slightly lower than your elbows. Slide your chair under your desk so you are close enough to maintain good posture.  When working at a computer, place your monitor at a height where you are looking straight ahead and you do not have to tilt your head forward or downward to look at the screen. Resting When lying down and resting, avoid positions that are most painful for you. Try to support your neck in a neutral position. You can use a contour pillow or a small rolled-up towel. Your pillow should support your neck but not push on it. This information is not intended to replace advice given to you by your health care provider. Make sure you discuss any questions you have with your health care provider. Document Released: 03/28/2005 Document Revised: 12/03/2015 Document Reviewed: 03/04/2015 Elsevier Interactive Patient Education  2018 [place] Ask your health care provider which exercises are safe for you. Do exercises exactly as told by your health care provider and adjust them as directed. It is normal to feel mild stretching, pulling, tightness, or discomfort as you do these exercises, but you should stop right away if you feel sudden pain or your pain gets worse. Do not begin these exercises until told by your health care provider. Stretching and range of motion  exercises These exercises warm up your muscles and joints and improve the movement and flexibility of your back. These exercises also help to relieve pain, numbness, and tingling. Exercise A: Lumbar rotation  1. Lie on your back on a firm surface and bend your knees. 2. Straighten your arms out to your sides so each arm forms an "L" shape with a side of your body (a 90 degree angle). 3. Slowly move both of your knees to one side of your body until you feel a stretch in your lower back. Try not to let your shoulders move off of the floor. 4. Hold for __________ seconds. 5. Tense your abdominal muscles and slowly move your knees back to the starting position. 6. Repeat this exercise on the other side of your body. Repeat __________ times. Complete this exercise __________ times a day. Exercise B: Prone extension on elbows  1. Lie on your  abdomen on a firm surface. 2. Prop yourself up on your elbows. 3. Use your arms to help lift your chest up until you feel a gentle stretch in your abdomen and your lower back. ? This will place some of your body weight on your elbows. If this is uncomfortable, try stacking pillows under your chest. ? Your hips should stay down, against the surface that you are lying on. Keep your hip and back muscles relaxed. 4. Hold for __________ seconds. 5. Slowly relax your upper body and return to the starting position. Repeat __________ times. Complete this exercise __________ times a day. Strengthening exercises These exercises build strength and endurance in your back. Endurance is the ability to use your muscles for a long time, even after they get tired. Exercise C: Pelvic tilt 1. Lie on your back on a firm surface. Bend your knees and keep your feet flat. 2. Tense your abdominal muscles. Tip your pelvis up toward the ceiling and flatten your lower back into the floor. ? To help with this exercise, you may place a small towel under your lower back and try to push  your back into the towel. 3. Hold for __________ seconds. 4. Let your muscles relax completely before you repeat this exercise.  Posture and body mechanics  Body mechanics refers to the movements and positions of your body while you do your daily activities. Posture is part of body mechanics. Good posture and healthy body mechanics can help to relieve stress in your body's tissues and joints. Good posture means that your spine is in its natural S-curve position (your spine is neutral), your shoulders are pulled back slightly, and your head is not tipped forward. The following are general guidelines for applying improved posture and body mechanics to your everyday activities. Standing   When standing, keep your spine neutral and your feet about hip-width apart. Keep a slight bend in your knees. Your ears, shoulders, and hips should line up.  When you do a task in which you stand in one place for a long time, place one foot up on a stable object that is 2-4 inches (5-10 cm) high, such as a footstool. This helps keep your spine neutral. Sitting   When sitting, keep your spine neutral and keep your feet flat on the floor. Use a footrest, if necessary, and keep your thighs parallel to the floor. Avoid rounding your shoulders, and avoid tilting your head forward.  When working at a desk or a computer, keep your desk at a height where your hands are slightly lower than your elbows. Slide your chair under your desk so you are close enough to maintain good posture.  When working at a computer, place your monitor at a height where you are looking straight ahead and you do not have to tilt your head forward or downward to look at the screen. Resting   When lying down and resting, avoid positions that are most painful for you.  If you have pain with activities such as sitting, bending, stooping, or squatting (flexion-based activities), lie in a position in which your body does not bend very much. For  example, avoid curling up on your side with your arms and knees near your chest (fetal position).  If you have pain with activities such as standing for a long time or reaching with your arms (extension-based activities), lie with your spine in a neutral position and bend your knees slightly. Try the following positions:  Lying on your side  with a pillow between your knees.  Lying on your back with a pillow under your knees. Lifting   When lifting objects, keep your feet at least shoulder-width apart and tighten your abdominal muscles.  Bend your knees and hips and keep your spine neutral. It is important to lift using the strength of your legs, not your back. Do not lock your knees straight out.  Always ask for help to lift heavy or awkward objects. This information is not intended to replace advice given to you by your health care provider. Make sure you discuss any questions you have with your health care provider. Document Released: 03/28/2005 Document Revised: 12/03/2015 Document Reviewed: 01/07/2015 Elsevier Interactive Patient Education  2018 [employer].  IF you received an x-ray today, you will receive an invoice from [facility]. Please contact [facility] at [phone] with questions or concerns regarding your invoice.   IF you received labwork today, you will receive an invoice from [facility]. Please contact [facility] at [phone] with questions or concerns regarding your invoice.   Our billing staff will not be able to assist you with questions regarding bills from these companies.  You will be contacted with the lab results as soon as they are available. The fastest way to get your results is to activate your My Chart account. Instructions are located on the last page of this paperwork. If you have not heard from [place] regarding the results in 2 weeks, please contact this office.
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
## 2017-05-15 DIAGNOSIS — M3509 Sicca syndrome with other organ involvement: Secondary | ICD-10-CM | POA: Diagnosis not present

## 2017-05-15 DIAGNOSIS — Z79899 Other long term (current) drug therapy: Secondary | ICD-10-CM | POA: Diagnosis not present

## 2017-05-15 DIAGNOSIS — E0500 Thyrotoxicosis with diffuse goiter without thyrotoxic crisis or storm: Secondary | ICD-10-CM | POA: Diagnosis not present

## 2017-06-08 ENCOUNTER — Other Ambulatory Visit: Payer: Self-pay | Admitting: Rheumatology

## 2017-06-08 NOTE — Telephone Encounter (Signed)
Last visit: 03/15/2017 Next visit: 08/15/2017 Labs: 03/15/2017 WNL  Eye exam: Patient has an appointment to update eye exam in April 2019   Okay to refill 30 day supply per Dr. Corliss Skainseveshwar.

## 2017-07-13 ENCOUNTER — Other Ambulatory Visit: Payer: Self-pay | Admitting: Rheumatology

## 2017-07-13 NOTE — Telephone Encounter (Signed)
Last visit: 03/15/2017 Next visit: 08/15/2017 Labs: 03/15/17 WNL PLQ Eye Exam: 05/15/17 WNL  Okay to refill per Dr. Corliss Skainseveshwar

## 2017-08-01 NOTE — Progress Notes (Signed)
Office Visit Note  Patient: Cynthia Fox             Date of Birth: 1971-02-17           MRN: [id number]             PCP: Wanda Plump, MD Referring: Wanda Plump, MD Visit Date: 08/15/2017 Occupation: @GUAROCC @    Subjective:  Sicca symptoms   History of Present Illness: Cynthia Fox is a 47 y.o. female with history of Sjogren's and Raynaud's.  She continues to take Plaquenil 200 mg twice daily.  She also takes pilocarpine 5 mg twice daily.  She tried taking it 3 times a day but developed excessive sweating.  She also uses Biotene products and eyedrops on a daily basis.  He continues to have significant sicca symptoms.  She sees a Education officer, community every 6 months and denies any recent dental caries.  She denies any joint pain or joint swelling at this time.  She continues to have Raynaud's in bilateral hands but is very mild since the weather change.  She has no digital ulcerations.  She is no longer taking Norvasc due to only needing it during the wintertime.   Activities of Daily Living:  Patient reports morning stiffness for 0 minutes.   Patient Denies nocturnal pain.  Difficulty dressing/grooming: Denies Difficulty climbing stairs: Denies Difficulty getting out of chair: Denies Difficulty using hands for taps, buttons, cutlery, and/or writing: Denies   Review of Systems  Constitutional: Negative for fatigue.  HENT: Positive for mouth dryness. Negative for mouth sores and nose dryness.   Eyes: Positive for dryness. Negative for pain and visual disturbance.  Respiratory: Negative for cough, hemoptysis, shortness of breath and difficulty breathing.   Cardiovascular: Negative for chest pain, palpitations, hypertension and swelling in legs/feet.  Gastrointestinal: Negative for blood in stool, constipation and diarrhea.  Endocrine: Negative for increased urination.  Genitourinary: Negative for painful urination.  Musculoskeletal: Negative for arthralgias, joint pain, joint swelling,  myalgias, muscle weakness, morning stiffness, muscle tenderness and myalgias.  Skin: Positive for color change. Negative for pallor, rash, hair loss, nodules/bumps, skin tightness, ulcers and sensitivity to sunlight.  Allergic/Immunologic: Negative for susceptible to infections.  Neurological: Negative for dizziness, numbness, headaches and weakness.  Hematological: Negative for swollen glands.  Psychiatric/Behavioral: Negative for depressed mood and sleep disturbance. The patient is not nervous/anxious.     PMFS History:  Patient Active Problem List   Diagnosis Date Noted  . 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 Annual physical exam 10/24/2016  . Menopause 10/13/2016  . H/O seasonal allergies 03/23/2016  . Vitamin D deficiency 03/23/2016  . High risk medication use 03/22/2016  . Insomnia 10/27/2015  . Sjogren's syndrome (HCC) 06/25/2015  . PCP NOTES >>>>>>>>>>>>>>>>>>>>>> 06/25/2015  . Graves disease 06/01/2015  . Hyperprolactinemia (HCC) 01/12/2015  . Elevated antinuclear antibody (ANA) level 07/23/2014    Past Medical History:  Diagnosis Date  . Compressive optic neuropathy   . Hepatitis A   . Seasonal allergies   . Sjogren's disease (HCC)    positive ANA nad Ro  . Thyroid eye disease     Family History  Problem Relation Age of Onset  . Hypertension Mother   . Retinal detachment Mother   . Thyroid disease Mother   . Diabetes Father   . Thyroid disease Sister   . Heart attack Maternal Grandfather   . Breast cancer Neg Hx   . Colon cancer Neg Hx    Past Surgical History:  Procedure Laterality Date  . EYE SURGERY Bilateral 2017, 2018   release of optic pressure   . FUNCTIONAL ENDOSCOPIC SINUS SURGERY Bilateral 05/2015  . Orbital Decompression Bilateral 05/2015   Social History   Social History Narrative   Original from [place]    Lives w/ husband     Objective: Vital Signs: BP 118/84 (BP Location: Left Arm, Patient Position: Sitting, Cuff Size: Normal)   Pulse  81   Resp 13   Ht 5' (1.524 m)   Wt 149 lb (67.6 kg)   BMI 29.10 kg/m    Physical Exam  Constitutional: She is oriented to person, place, and time. She appears well-developed and well-nourished.  HENT:  Head: Normocephalic and atraumatic.  No parotid swelling.  Eyes: Conjunctivae and EOM are normal.  Neck: Normal range of motion.  Cardiovascular: Normal rate, regular rhythm, normal heart sounds and intact distal pulses.  Pulmonary/Chest: Effort normal and breath sounds normal.  Abdominal: Soft. Bowel sounds are normal.  Lymphadenopathy:    She has no cervical adenopathy.  Neurological: She is alert and oriented to person, place, and time.  Skin: Skin is warm and dry. Capillary refill takes less than 2 seconds.  No digital ulcerations noted.  Psychiatric: She has a normal mood and affect. Her behavior is normal.  Nursing note and vitals reviewed.    Musculoskeletal Exam: C-spine, thoracic spine, lumbar spine good range of motion.  No midline spinal tenderness.  No SI joint tenderness.  Shoulder joints, elbow joints, wrist joints, MCPs, PIPs, DIPs good range of motion with no synovitis.  Hip joints, knee joints, ankle joints, MTPs, PIPs, DIPs good range of motion with no synovitis.  No warmth or effusion of bilateral knees.  CDAI Exam: No CDAI exam completed.    Investigation: No additional findings.PLQ eye exam: 05/15/2017 CBC Latest Ref Rng & Units 03/15/2017 11/21/2016 05/19/2016  WBC 3.8 - 10.8 Thousand/uL 6.3 5.7 6.3  Hemoglobin 11.7 - 15.5 g/dL 45.4 09.8 11.9  Hematocrit 35.0 - 45.0 % 41.8 36.3 39.8  Platelets 140 - 400 Thousand/uL 271 281 303   CMP Latest Ref Rng & Units 03/15/2017 03/15/2017 11/21/2016  Glucose 65 - 99 mg/dL - 91 90  BUN 7 - 25 mg/dL - 12 15  Creatinine 1.47 - 1.10 mg/dL - 8.29 5.62  Sodium 130 - 146 mmol/L - 137 140  Potassium 3.5 - 5.3 mmol/L - 4.1 4.7  Chloride 98 - 110 mmol/L - 100 105  CO2 20 - 32 mmol/L - 30 23  Calcium 8.6 - 10.2 mg/dL - 9.6 8.9    Total Protein 6.1 - 8.1 g/dL 7.8 7.5 7.0  Total Bilirubin 0.2 - 1.2 mg/dL - 1.1 0.6  Alkaline Phos 39 - 117 IU/L - - 104  AST 10 - 35 U/L - 19 23  ALT 6 - 29 U/L - 17 18    Imaging: No results found.  Speciality Comments: PLQ Eye Exam: 05/15/17 WNL @ [facility] Follow up in 1 year    Procedures:  No procedures performed Allergies: Patient has no known allergies.   Assessment / Plan:     Visit Diagnoses: Sjogren's syndrome with keratoconjunctivitis sicca (HCC) - Positive ANA, positive Ro, positive rheumatoid factor, negative CCP.  She has chronic sicca symptoms.  She has no parotid swelling.  She continues to take pilocarpine 5 mg twice daily.  She tried taking it 3 times a day but developed excessive sweating.  She uses eyedrops and Biotene products which provide temporary  relief.  She will continue taking Plaquenil 200 mg twice daily.  Refill sent to the pharmacy.  She goes to the dentist every 6 months and denies any recent dental caries.  CBC, CMP, and UA were ordered today.- Plan: Urinalysis, Routine w reflex microscopic  High risk medication use - PLQ.eye exam: 05/15/2017.  Seen CMP were drawn today for drug toxicity.- Plan: CBC with Differential/Platelet, COMPLETE METABOLIC PANEL WITH GFR, Urinalysis, Routine w reflex microscopic  Raynaud's disease without gangrene: Her symptoms have improved since the weather change.  She is no longer taking Norvasc due to not needing it during the wintertime.  She has no digital ulcerations or signs of gangrene.  Primary insomnia: Improved.   Other medical conditions are listed as follows:  History of Graves' disease  History of vitamin D deficiency  H/O seasonal allergies    Orders: Orders Placed This Encounter  Procedures  . CBC with Differential/Platelet  . COMPLETE METABOLIC PANEL WITH GFR  . Urinalysis, Routine w reflex microscopic   Meds ordered this encounter  Medications  . pilocarpine (SALAGEN) 5 MG tablet    Sig: Take  1 tablet (5 mg total) by mouth 3 (three) times daily as needed.    Dispense:  270 tablet    Refill:  1  . hydroxychloroquine (PLAQUENIL) 200 MG tablet    Sig: TAKE 1 TABLET(200 MG) BY MOUTH DAILY    Dispense:  30 tablet    Refill:  2    Follow-Up Instructions: Return in about 6 months (around 02/15/2018) for Sjogren's syndrome.   Gearldine Bienenstockaylor M Ripley Bogosian, PA-C   I examined and evaluated the patient with Sherron Alesaylor Melisia Leming PA. The plan of care was discussed as noted above.  Pollyann SavoyShaili Deveshwar, MD  Note - This record has been created using Animal nutritionistDragon software.  Chart creation errors have been sought, but may not always  have been located. Such creation errors do not reflect on  the standard of medical care.

## 2017-08-07 ENCOUNTER — Encounter: Payer: Self-pay | Admitting: Internal Medicine

## 2017-08-07 ENCOUNTER — Ambulatory Visit: Payer: 59 | Admitting: Internal Medicine

## 2017-08-07 ENCOUNTER — Ambulatory Visit: Payer: 59 | Admitting: Endocrinology

## 2017-08-07 VITALS — BP 112/76 | HR 90 | Ht 58.5 in | Wt 146.4 lb

## 2017-08-07 DIAGNOSIS — E221 Hyperprolactinemia: Secondary | ICD-10-CM

## 2017-08-07 DIAGNOSIS — E0500 Thyrotoxicosis with diffuse goiter without thyrotoxic crisis or storm: Secondary | ICD-10-CM

## 2017-08-07 DIAGNOSIS — H05839 Thyroid orbitopathy, unspecified orbit: Secondary | ICD-10-CM

## 2017-08-07 NOTE — Progress Notes (Signed)
Patient ID: Cynthia Fox, female   DOB: 08-May-1970, 47 y.o.   MRN: [id number]   HPI  Cynthia Fox is a 47 y.o.-year-old female, initially referred by Dr Lily Peer, returning for Graves ds, Graves ophthalmopathy, and elevated prolactin. Last visit 6 months ago.  Graves ds:  Reviewed history: Pt had a thyroid uptake in 08/08/2014 (unfortunately, a scan was not done at that time) and this returned at ULN, 28.4% (10-30%).  We started MMI and the dose was increased to 10 mg in am and 5 mg in pm >> TSH high  (as she did not return for recheck as advised) >> we stopped MMi >> TSH 0.09 >> advised her to restart MMi 5 mg daily >> dose decreased to 2.5 mg in 08/2015. She is off MMI now since 09/2015.  TSI levels have been elevated: Lab Results  Component Value Date   TSI 351 (H) 07/29/2016   TSI 471 (H) 01/29/2016   TSI 247 (H) 08/14/2015   TSI 297 (H) 09/16/2014   Reviewed patient's TFTs: Lab Results  Component Value Date   TSH 1.60 02/01/2017   TSH 2.80 07/29/2016   TSH 1.13 05/12/2016   TSH 1.53 04/08/2016   TSH 0.810 01/29/2016   TSH 1.27 11/11/2015   TSH 4.35 09/29/2015   TSH 3.89 08/14/2015   TSH 3.28 07/10/2015   TSH 0.09 (L) 05/27/2015   FREET4 0.84 02/01/2017   FREET4 1.1 07/29/2016   FREET4 0.81 05/12/2016   FREET4 1.03 04/08/2016   FREET4 1.31 01/29/2016   FREET4 0.82 11/11/2015   FREET4 0.73 09/29/2015   FREET4 0.77 08/14/2015   FREET4 0.88 07/10/2015   FREET4 1.53 05/27/2015   Graves ophthalmopathy: 05/18/2015:  Called by Dr. Dione Booze: Patient seen in his office recently for regular eye exam while on Plaquenil. She noticed a visual field defect in one of her eyes and had her come back for repeat. The second visual field was worse. He obtained an MRI of her brain with focus on the pituitary that showed normal brain structures and pituitary gland, however ocular muscles were enlarged. The third visual field was even worse than the previous two. She was referred to [facility] ophthalmology, where she is seeing Dr. Toni Arthurs. She is planning to perform decompression surgery. However, due to the severity of the visual field defects, she recommended pulse steroids with Solu-Medrol. She can do this in our office, however patient would like to have this locally. Dr. Dione Booze called me to see if this can be arranged.   The recommended Solu-Medrol regimen: - 500 mg once a week for 6 weeks, followed by - 250 mg once a week for the next 6 weeks.  I d/w Dr Toni Arthurs and plan was for pt to have decompressive surgery first >> she had this 05/21/2015, and then started the above Solumedrol regimen - last dose 08/2015.   She had a lot of problems with double vision and in the past, she could only drive with corrective glasses.   Before last visit, she had strabismus surgery on 10/28/2016 and this was successful and she only now has double vision when she looks to the left, but much better compared to previous.  She does not need the prism glasses anymore.  Elevated prolactin (normal: 2.8-29.2)   - latest levels have been normal: Lab Results  Component Value Date   PROLACTIN 11.3 07/29/2016   PROLACTIN 28.5 09/25/2015   PROLACTIN 30.9 01/12/2015   PROLACTIN 39.9 07/22/2014   PROLACTIN 40.3  07/16/2014   Previously: Component     Latest Ref Rng 09/16/2014  Prolactin, Total      28.0  Prolactin, Monomeric     3.2 - 25.2 ng/mL 23.0   She denies galactorrhea, headaches.  ROS: Constitutional: no weight gain/no weight loss, no fatigue, + hot flushes, no subjective hypothermia Eyes: no blurry vision, no xerophthalmia ENT: no sore throat, no nodules palpated in throat, no dysphagia, no odynophagia, no hoarseness Cardiovascular: no CP/no SOB/no palpitations/no leg swelling Respiratory: no cough/no SOB/no wheezing Gastrointestinal: no N/no V/no D/no C/no acid reflux Musculoskeletal: no muscle aches/no joint aches Skin: no rashes, no hair loss Neurological: no tremors/no  numbness/no tingling/no dizziness  I reviewed pt's medications, allergies, PMH, social hx, family hx, and changes were documented in the history of present illness. Otherwise, unchanged from my initial visit note.  Past Medical History:  Diagnosis Date  . Compressive optic neuropathy   . Hepatitis A   . Seasonal allergies   . Sjogren's disease (HCC)    positive ANA nad Ro  . Thyroid eye disease    Past Surgical History:  Procedure Laterality Date  . EYE SURGERY Bilateral 2017, 2018   release of optic pressure   . FUNCTIONAL ENDOSCOPIC SINUS SURGERY Bilateral 05/2015  . Orbital Decompression Bilateral 05/2015    History   Social History  . Marital Status: Married    Spouse Name: N/A  . 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 Smoking status: Never Smoker   . Smokeless tobacco: Never Used  . Alcohol Use: Yes     Comment: weekends   Current Outpatient Medications on File Prior to Visit  Medication Sig Dispense Refill  . Cholecalciferol (VITAMIN D3) 5000 units CAPS Take 1 capsule by mouth 2 (two) times a week.    . cyclobenzaprine (FLEXERIL) 10 MG tablet Take 1 tablet (10 mg total) by mouth 3 (three) times daily as needed for muscle spasms. 30 tablet 1  . Estradiol 10 MCG TABS vaginal tablet Place 1 tablet (10 mcg total) vaginally 2 (two) times a week. 8 tablet 11  . hydroxychloroquine (PLAQUENIL) 200 MG tablet TAKE 1 TABLET(200 MG) BY MOUTH DAILY 30 tablet 2  . meloxicam (MOBIC) 7.5 MG tablet Take 1-2 tablets (7.5-15 mg total) by mouth daily. 60 tablet 0  . pilocarpine (SALAGEN) 5 MG tablet Take 1 tablet (5 mg total) by mouth 3 (three) times daily as needed. 270 tablet 1  . Selenium 200 MCG CAPS Take 200 mcg by mouth daily.     No current facility-administered medications on file prior to visit.    No Known Allergies   Family History  Problem Relation Age of Onset  . Hypertension Mother   . Retinal detachment  Mother   . Thyroid disease Mother   . Diabetes Father   . Thyroid disease Sister   . Heart attack Maternal Grandfather   . Breast cancer Neg Hx   . Colon cancer Neg Hx   - also see history of present illness  PE: BP 112/76   Pulse 90   Ht 4' 10.5" (1.486 m)   Wt 146 lb 6.4 oz (66.4 kg)   SpO2 99%   BMI 30.08 kg/m  Body mass index is 30.08 kg/m. Wt Readings from Last 3 Encounters:  08/07/17 146 lb 6.4 oz (66.4 kg)  05/03/17 143 lb (64.9 kg)  03/15/17 144 lb (65.3 kg)   Constitutional:  normal weight, in NAD Eyes: PERRLA, EOMI, + mild L exophthalmos +  Very mid L enotropia ENT: moist mucous membranes, no thyromegaly, no cervical lymphadenopathy Cardiovascular: RRR, No MRG Respiratory: CTA B Gastrointestinal: abdomen soft, NT, ND, BS+ Musculoskeletal: no deformities, strength intact in all 4 Skin: moist, warm, no rashes Neurological: no tremor with outstretched hands, DTR normal in all 4  ASSESSMENT: 1. Mild Graves ds.  2. Graves ophthalmopathy - had Solumedrol - 500 mg weekly x 6 doses, then 250 mg weekly x 6 doses - last in 08/2015.   3. Elevated prolactin  PLAN:  1. Patient with mild Graves' disease based on the presence of TSI antibodies and her uptake and scan, however, with significant Graves' ophthalmopathy -TSI level was improving at last check, a year ago, but still high -She was on methimazole but was able to come off in 09/2015 labs remained stable after she stopped the medication and she is asymptomatic.  -We started selenium 1.5 years ago and TSI started to decrease so we will continue this -We will check TSI again at this visit, along with TFTs - RTc in 1 year for a visit and 6 mo for labs  2. GO  - no complaints at this visit -She had significant diplopia, but much improved after her strabismus surgery on 10/28/2016 (she previously had decompression since surgery in 05/21/2015)  3. Elevated PRL  - patient with previously high prolactin levels, normalized  at last checks, on no medication.  Of note, we ruled out macro prolactin (see HPI) - she sees ObGyn (Dr Lily Peer) - she is now menopausal - we will recheck prolactin today  Component     Latest Ref Rng & Units 08/07/2017  TSH     0.35 - 4.50 uIU/mL 1.78  Prolactin     ng/mL 10.0  T4,Free(Direct)     0.60 - 1.60 ng/dL 1.61  Triiodothyronine,Free,Serum     2.3 - 4.2 pg/mL 3.3  TSI     <140 % baseline <89   Labs are normal and TSI's have decreased now to normal!  We will continue selenium.  Carlus Pavlov, MD PhD [facility] Endocrinology

## 2017-08-07 NOTE — Patient Instructions (Signed)
Please stop at the lab.  Continue Selenium 200 mcg daily.  Please come back for a follow-up appointment in 1 year, but for labs in 6 months.

## 2017-08-08 LAB — TSH: TSH: 1.78 u[IU]/mL (ref 0.35–4.50)

## 2017-08-08 LAB — T4, FREE: Free T4: 0.78 ng/dL (ref 0.60–1.60)

## 2017-08-08 LAB — T3, FREE: T3 FREE: 3.3 pg/mL (ref 2.3–4.2)

## 2017-08-08 LAB — PROLACTIN: PROLACTIN: 10 ng/mL

## 2017-08-10 LAB — THYROID STIMULATING IMMUNOGLOBULIN: TSI: <89 % baseline (ref <140)

## 2017-08-14 DIAGNOSIS — Z79899 Other long term (current) drug therapy: Secondary | ICD-10-CM | POA: Diagnosis not present

## 2017-08-14 DIAGNOSIS — M3509 Sicca syndrome with other organ involvement: Secondary | ICD-10-CM | POA: Diagnosis not present

## 2017-08-14 DIAGNOSIS — H16122 Filamentary keratitis, left eye: Secondary | ICD-10-CM | POA: Diagnosis not present

## 2017-08-15 ENCOUNTER — Ambulatory Visit: Payer: 59 | Admitting: Rheumatology

## 2017-08-15 ENCOUNTER — Encounter: Payer: Self-pay | Admitting: Rheumatology

## 2017-08-15 VITALS — BP 118/84 | HR 81 | Resp 13 | Ht 60.0 in | Wt 149.0 lb

## 2017-08-15 DIAGNOSIS — M3501 Sicca syndrome with keratoconjunctivitis: Secondary | ICD-10-CM | POA: Diagnosis not present

## 2017-08-15 DIAGNOSIS — Z889 Allergy status to unspecified drugs, medicaments and biological substances status: Secondary | ICD-10-CM

## 2017-08-15 DIAGNOSIS — Z8639 Personal history of other endocrine, nutritional and metabolic disease: Secondary | ICD-10-CM

## 2017-08-15 DIAGNOSIS — F5101 Primary insomnia: Secondary | ICD-10-CM

## 2017-08-15 DIAGNOSIS — Z79899 Other long term (current) drug therapy: Secondary | ICD-10-CM

## 2017-08-15 DIAGNOSIS — I7300 Raynaud's syndrome without gangrene: Secondary | ICD-10-CM | POA: Diagnosis not present

## 2017-08-15 MED ORDER — HYDROXYCHLOROQUINE SULFATE 200 MG PO TABS: ORAL_TABLET | ORAL | 2 refills | Status: DC

## 2017-08-15 MED ORDER — PILOCARPINE HCL 5 MG PO TABS: 5.0000 mg | ORAL_TABLET | Freq: Three times a day (TID) | ORAL | 1 refills | Status: DC | PRN

## 2017-08-16 LAB — CBC WITH DIFFERENTIAL/PLATELET
Basophils Absolute: 60 cells/uL (ref 0–200)
Basophils Relative: 1 %
EOS ABS: 588 {cells}/uL — AB (ref 15–500)
Eosinophils Relative: 9.8 %
HCT: 39.2 % (ref 35.0–45.0)
HEMOGLOBIN: 13.4 g/dL (ref 11.7–15.5)
Lymphs Abs: 2388 cells/uL (ref 850–3900)
MCH: 33.6 pg — AB (ref 27.0–33.0)
MCHC: 34.2 g/dL (ref 32.0–36.0)
MCV: 98.2 fL (ref 80.0–100.0)
MONOS PCT: 10.2 %
MPV: 11.9 fL (ref 7.5–12.5)
Neutro Abs: 2352 cells/uL (ref 1500–7800)
Neutrophils Relative %: 39.2 %
PLATELETS: 278 10*3/uL (ref 140–400)
RBC: 3.99 10*6/uL (ref 3.80–5.10)
RDW: 12.4 % (ref 11.0–15.0)
TOTAL LYMPHOCYTE: 39.8 %
WBC mixed population: 612 cells/uL (ref 200–950)
WBC: 6 10*3/uL (ref 3.8–10.8)

## 2017-08-16 LAB — COMPLETE METABOLIC PANEL WITH GFR
AG Ratio: 1.5 (calc) (ref 1.0–2.5)
ALBUMIN MSPROF: 4.6 g/dL (ref 3.6–5.1)
ALKALINE PHOSPHATASE (APISO): 118 U/L — AB (ref 33–115)
ALT: 29 U/L (ref 6–29)
AST: 35 U/L (ref 10–35)
BILIRUBIN TOTAL: 0.7 mg/dL (ref 0.2–1.2)
BUN: 7 mg/dL (ref 7–25)
CHLORIDE: 100 mmol/L (ref 98–110)
CO2: 32 mmol/L (ref 20–32)
Calcium: 9.8 mg/dL (ref 8.6–10.2)
Creat: 0.76 mg/dL (ref 0.50–1.10)
GFR, Est African American: 107 mL/min/{1.73_m2} (ref >=60)
GFR, Est Non African American: 92 mL/min/{1.73_m2} (ref >=60)
GLUCOSE: 93 mg/dL (ref 65–99)
Globulin: 3.1 g/dL (calc) (ref 1.9–3.7)
POTASSIUM: 4 mmol/L (ref 3.5–5.3)
Sodium: 139 mmol/L (ref 135–146)
Total Protein: 7.7 g/dL (ref 6.1–8.1)

## 2017-08-16 LAB — URINALYSIS, ROUTINE W REFLEX MICROSCOPIC
BILIRUBIN URINE: NEGATIVE
Glucose, UA: NEGATIVE
Hgb urine dipstick: NEGATIVE
KETONES UR: NEGATIVE
Leukocytes, UA: NEGATIVE
NITRITE: NEGATIVE
PROTEIN: NEGATIVE
Specific Gravity, Urine: 1.009 (ref 1.001–1.03)
pH: 8 (ref 5.0–8.0)

## 2017-08-17 NOTE — Progress Notes (Signed)
Labs are stable.  We will continue to monitor slightly elevated alk phos.

## 2017-08-30 DIAGNOSIS — E0580 Other thyrotoxicosis without thyrotoxic crisis or storm: Secondary | ICD-10-CM | POA: Diagnosis not present

## 2017-08-30 DIAGNOSIS — M3501 Sicca syndrome with keratoconjunctivitis: Secondary | ICD-10-CM | POA: Diagnosis not present

## 2017-10-31 ENCOUNTER — Encounter: Payer: 59 | Admitting: Internal Medicine

## 2017-10-31 ENCOUNTER — Other Ambulatory Visit: Payer: Self-pay

## 2017-10-31 MED ORDER — ESTRADIOL 10 MCG VA TABS: 1.0000 | ORAL_TABLET | VAGINAL | 1 refills | Status: DC

## 2017-11-13 ENCOUNTER — Encounter: Payer: Self-pay | Admitting: Internal Medicine

## 2017-11-13 ENCOUNTER — Ambulatory Visit (INDEPENDENT_AMBULATORY_CARE_PROVIDER_SITE_OTHER): Payer: 59 | Admitting: Internal Medicine

## 2017-11-13 VITALS — BP 126/68 | HR 86 | Temp 97.8°F | Resp 16 | Ht 60.0 in | Wt 141.2 lb

## 2017-11-13 DIAGNOSIS — Z0000 Encounter for general adult medical examination without abnormal findings: Secondary | ICD-10-CM | POA: Diagnosis not present

## 2017-11-13 NOTE — Progress Notes (Signed)
Pre visit review using our clinic review tool, if applicable. No additional management support is needed unless otherwise documented below in the visit note. 

## 2017-11-13 NOTE — Assessment & Plan Note (Signed)
Thyroid disease: Per Dr. Elvera LennoxGherghe, reported diplopia last year but that has improved after orbital decompression surgery.  From time to time she sees clear nasal discharge, recommend to discuss with Endo or surgery if that becomes persistent  (related to orbital surgery)).  No headaches. Low back pain: Going on for few days, getting better, located at the SI joint, related to Sjogren's?.  Recommend OTC NSAIDs and let me know if not improving. Onychomycosis: Reports that she was prescribed fluconazole for 6 months , already has taken it x 5 months , great toenails are improving.  Recent labs normal. RTC 1 year

## 2017-11-13 NOTE — Patient Instructions (Signed)
GO TO THE LAB : Get the blood work     GO TO THE FRONT DESK Schedule your next appointment   for a physical exam in 1 year 

## 2017-11-13 NOTE — Assessment & Plan Note (Addendum)
-  Td 2014 per patient -- to see gyn 12/2017;  last Pap smear 07/2014, last mammogram 01-2017 --CCS: Never had a colonoscopy;   IFOB 2018 (-), repeat IFOB -labs: reviewed, check a FLP -Diet and exercise: Much improved for the last 3 months.  Has lost some weight. Praised

## 2017-11-13 NOTE — Progress Notes (Signed)
Subjective:    Patient ID: Cynthia Fox, female    DOB: 03/30/1971, 47 y.o.   MRN: [id number]  DOS:  11/13/2017 Type of visit - description : cpx Interval history: Here for a CPX   Review of Systems Since the last office visit, she gained weight, 3 months ago started to exercise regularly and eating healthier, has lost some weight. Developed bilateral low back pain last week, no radiation, better today. Has been taking fluconazole for 5 months, see a/p. Occasionally burning feeling at the external aspect of the right foot, started a month ago, infrequent sxs, happen  day or night.   Other than above, a 14 point review of systems is negative     Past Medical History:  Diagnosis Date  . Compressive optic neuropathy   . Hepatitis A   . Seasonal allergies   . Sjogren's disease (HCC)    positive ANA nad Ro  . Thyroid eye disease     Past Surgical History:  Procedure Laterality Date  . EYE SURGERY Bilateral 2017, 2018   release of optic pressure   . FUNCTIONAL ENDOSCOPIC SINUS SURGERY Bilateral 05/2015  . Orbital Decompression Bilateral 05/2015   Family History  Problem Relation Age of Onset  . Hypertension Mother   . Retinal detachment Mother   . Thyroid disease Mother   . Diabetes Father   . Thyroid disease Sister   . Heart attack Maternal Grandfather   . Breast cancer Neg Hx   . Colon cancer Neg Hx     Social History   Socioeconomic History  . Marital status: Married    Spouse name: Not on file  . Number of children: 0  . Years of education: Not on file  . Highest education level: Not on file  Occupational History  . 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 Financial resource strain: Not on file  . Food insecurity:    Worry: Not on file    Inability: Not on file  . Transportation needs:    Medical: Not on file    Non-medical: Not on file  Tobacco Use  . Smoking status: Never Smoker  . Smokeless tobacco: Never Used  Substance and Sexual Activity    . Alcohol use: Yes    Alcohol/week: 0.0 oz    Comment: weekends  . Drug use: No  . Sexual activity: Yes  Lifestyle  . Physical activity:    Days per week: Not on file    Minutes per session: Not on file  . Stress: Not on file  Relationships  . Social connections:    Talks on phone: Not on file    Gets together: Not on file    Attends religious service: Not on file    Active member of club or organization: Not on file    Attends meetings of clubs or organizations: Not on file    Relationship status: Not on file  . Intimate partner violence:    Fear of current or ex partner: Not on file    Emotionally abused: Not on file    Physically abused: Not on file    Forced sexual activity: Not on file  Other Topics Concern  . Not on file  Social History Narrative   Original from [place]    Lives w/ husband      Allergies as of 11/13/2017   No Known Allergies     Medication List        Accurate as of  11/13/17  8:36 PM. Always use your most recent med list.          Estradiol 10 MCG Tabs vaginal tablet Place 1 tablet (10 mcg total) vaginally 2 (two) times a week.   hydroxychloroquine 200 MG tablet Commonly known as:  PLAQUENIL TAKE 1 TABLET(200 MG) BY MOUTH DAILY   pilocarpine 5 MG tablet Commonly known as:  SALAGEN Take 1 tablet (5 mg total) by mouth 3 (three) times daily as needed.   Selenium 200 MCG Caps Take 200 mcg by mouth daily.   Vitamin D3 5000 units Caps Take 1 capsule by mouth 2 (two) times a week.          Objective:   Physical Exam BP 126/68 (BP Location: Left Arm, Patient Position: Sitting, Cuff Size: Small)   Pulse 86   Temp 97.8 F (36.6 C) (Oral)   Resp 16   Ht 5' (1.524 m)   Wt 141 lb 4 oz (64.1 kg)   SpO2 98%   BMI 27.59 kg/m  General: Well developed, NAD, see BMI.  Neck: No  thyromegaly  HEENT:  Normocephalic . Face symmetric, atraumatic Lungs:  CTA B Normal respiratory effort, no intercostal retractions, no accessory muscle  use. Heart: RRR,  no murmur.  No pretibial edema bilaterally  Abdomen:  Not distended, soft, non-tender. No rebound or rigidity.   Skin: Exposed areas without rash. Not pale. Not jaundice.   Great toenails: Dystrophic at the tip of the nails otherwise looks healthy MSK: Slightly TTP at the left sacroiliac area. Neurologic:  alert & oriented X3.  Speech normal, gait appropriate for age and unassisted Strength symmetric and appropriate for age.  DTRs symmetric. Psych: Cognition and judgment appear intact.  Cooperative with normal attention span and concentration.  Behavior appropriate. No anxious or depressed appearing.     Assessment & Plan:   Assessment ENDO:    Dr Elvera Lennox --Luiz Blare' disease dx 2016, ophthalmopathy dx ~ 04-2015 , had diplopia, s/p orbital decompression surgery ~ 05-2015   --h/o hyperprolactinemia  RHEUM:  Dr Victory Dakin  Sjogren syndrome  Dx 2016  --  + ANA Menopausal   PLAN: Thyroid disease: Per Dr. Elvera Lennox, reported diplopia last year but that has improved after orbital decompression surgery.  From time to time she sees clear nasal discharge, recommend to discuss with Endo or surgery if that becomes persistent  (related to orbital surgery)).  No headaches. Low back pain: Going on for few days, getting better, located at the SI joint, related to Sjogren's?.  Recommend OTC NSAIDs and let me know if not improving. Onychomycosis: Reports that she was prescribed fluconazole for 6 months , already has taken it x 5 months , great toenails are improving.  Recent labs normal. RTC 1 year

## 2017-11-14 LAB — LIPID PANEL
CHOL/HDL RATIO: 3
Cholesterol: 159 mg/dL (ref 0–200)
HDL: 53.4 mg/dL (ref >39.00)
LDL CALC: 70 mg/dL (ref 0–99)
NonHDL: 105.29
Triglycerides: 178 mg/dL — ABNORMAL HIGH (ref 0.0–149.0)
VLDL: 35.6 mg/dL (ref 0.0–40.0)

## 2018-01-01 ENCOUNTER — Encounter: Payer: 59 | Admitting: Obstetrics & Gynecology

## 2018-01-16 ENCOUNTER — Ambulatory Visit (HOSPITAL_BASED_OUTPATIENT_CLINIC_OR_DEPARTMENT_OTHER)
Admission: RE | Admit: 2018-01-16 | Discharge: 2018-01-16 | Disposition: A | Discharge status: Home / Self Care | Payer: 59 | Source: Ambulatory Visit | Attending: Internal Medicine | Admitting: Internal Medicine

## 2018-01-16 ENCOUNTER — Other Ambulatory Visit: Payer: Self-pay | Admitting: Internal Medicine

## 2018-01-16 DIAGNOSIS — Z1231 Encounter for screening mammogram for malignant neoplasm of breast: Secondary | ICD-10-CM | POA: Insufficient documentation

## 2018-01-26 ENCOUNTER — Other Ambulatory Visit: Payer: Self-pay | Admitting: Internal Medicine

## 2018-01-26 DIAGNOSIS — E0500 Thyrotoxicosis with diffuse goiter without thyrotoxic crisis or storm: Secondary | ICD-10-CM

## 2018-01-30 ENCOUNTER — Other Ambulatory Visit: Payer: Self-pay | Admitting: Obstetrics & Gynecology

## 2018-02-05 ENCOUNTER — Other Ambulatory Visit: Payer: 59

## 2018-02-05 IMAGING — MG DIGITAL SCREENING BILATERAL MAMMOGRAM WITH CAD
4 series · 4 of 4 positions shown · non-contrast
Comparison: Previous exam(s).

CLINICAL DATA: Screening.

EXAM:
DIGITAL SCREENING BILATERAL MAMMOGRAM WITH CAD

[R MLO]
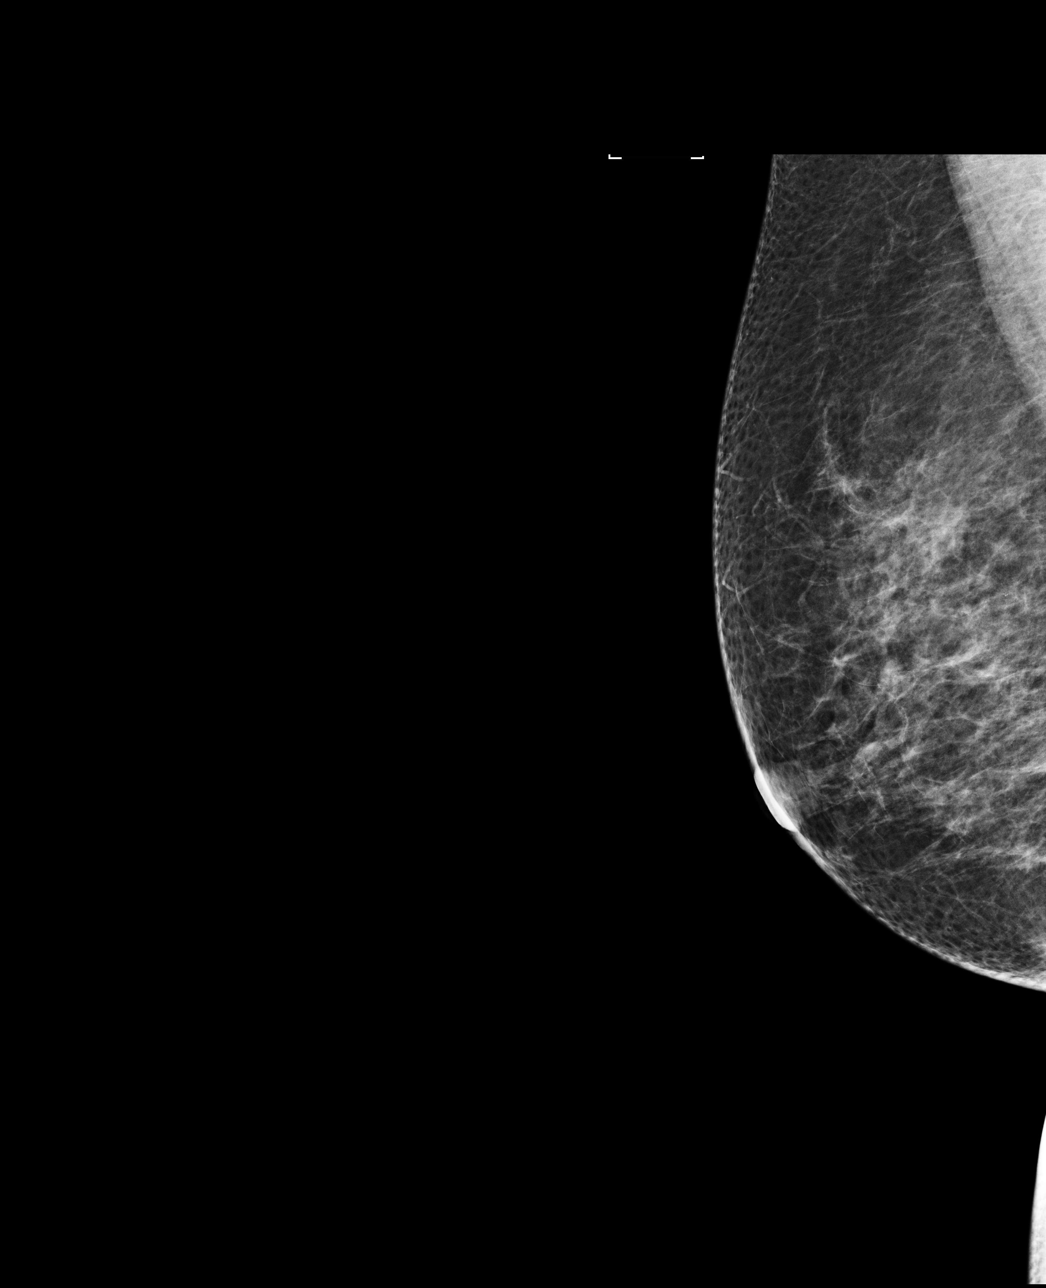

[R CC]
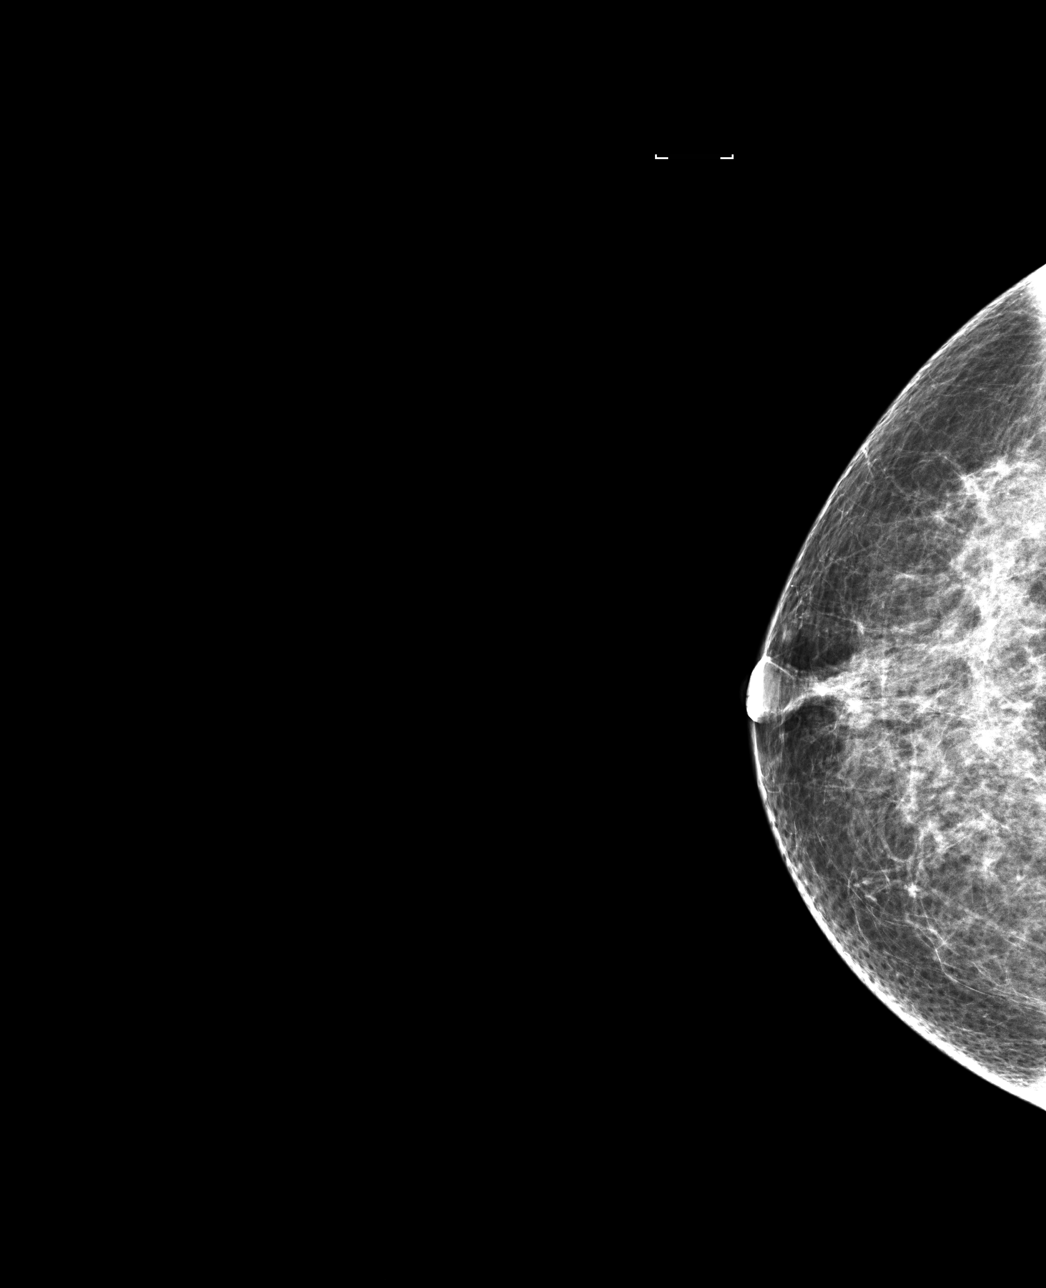

[L CC]
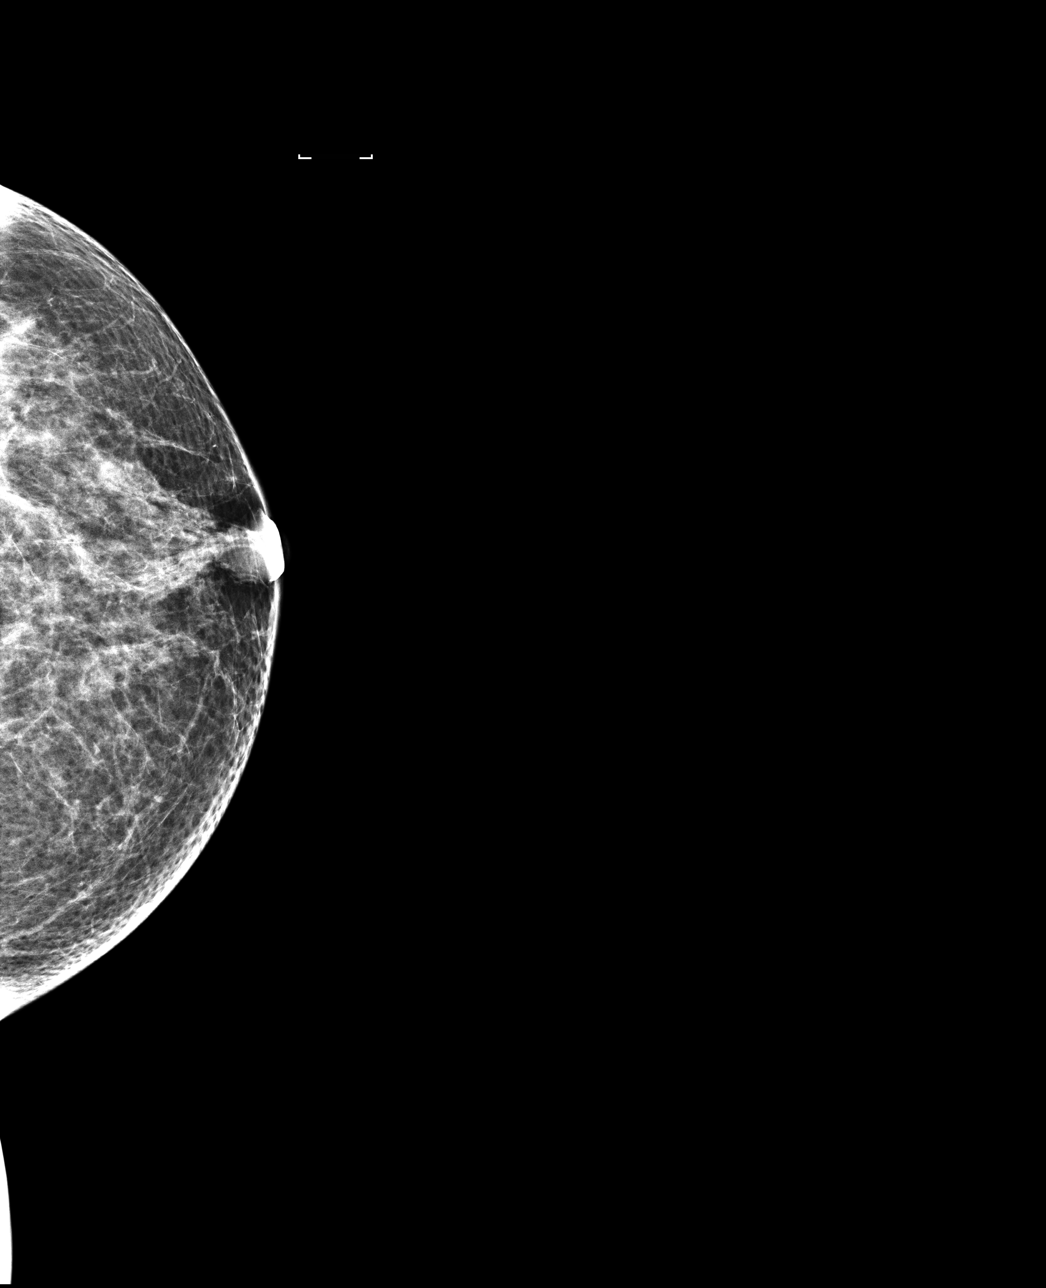

[L MLO]
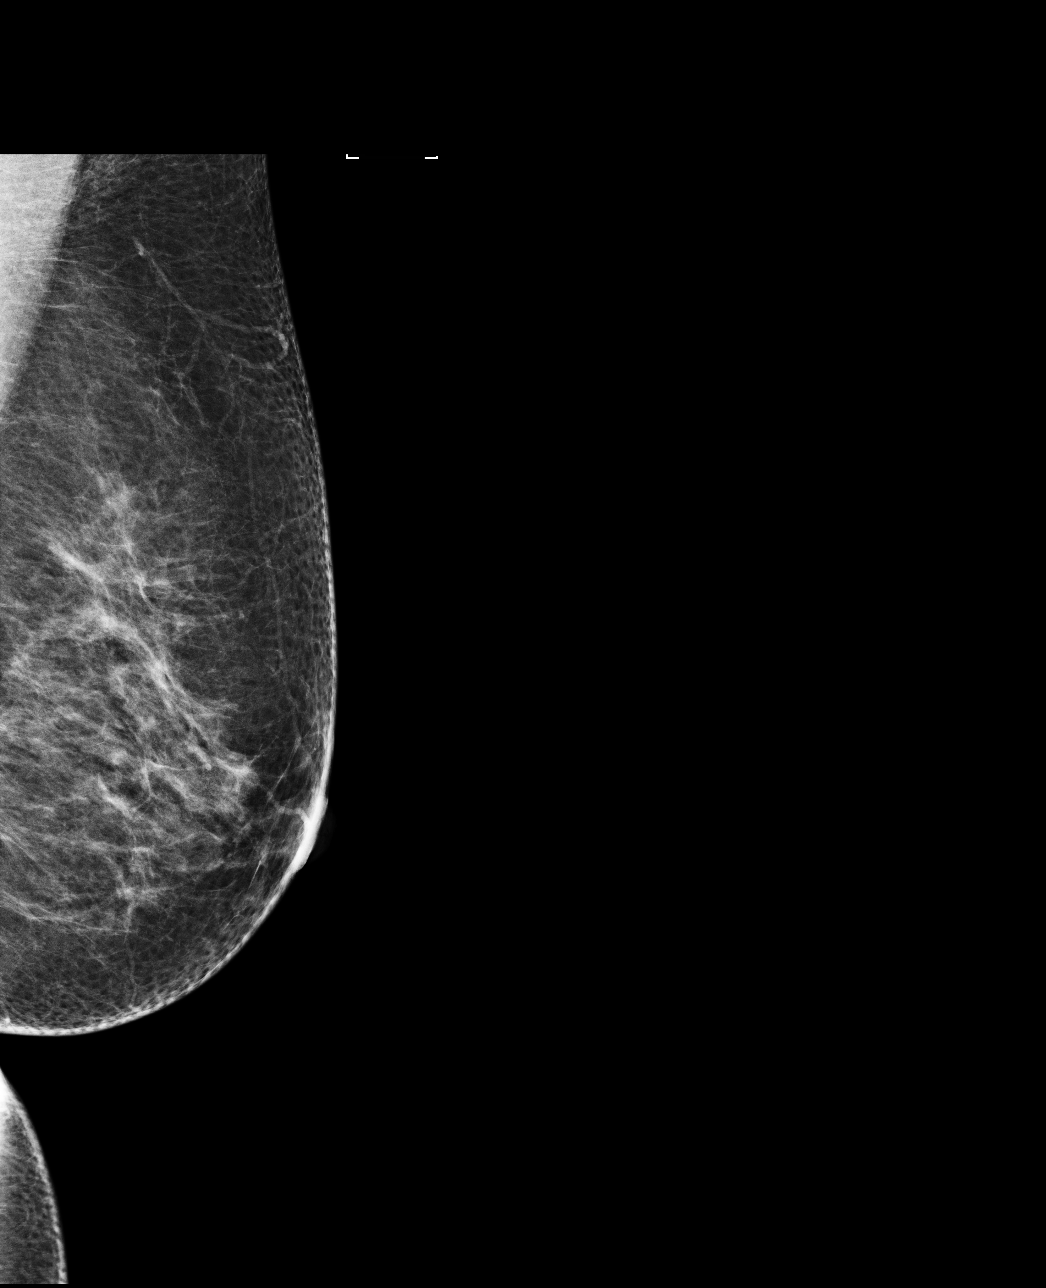

[4 of 4 positions shown; findings below may reference images not displayed]

ACR Breast Density Category c: The breast tissue is heterogeneously
dense, which may obscure small masses.
FINDINGS: There are no findings suspicious for malignancy. Images were
processed with CAD.
IMPRESSION: No mammographic evidence of malignancy. A result letter of this
screening mammogram will be mailed directly to the patient.

RECOMMENDATION:
Screening mammogram in one year. (Code:[id number])

BI-RADS CATEGORY  1: Negative.

## 2018-02-06 ENCOUNTER — Other Ambulatory Visit (INDEPENDENT_AMBULATORY_CARE_PROVIDER_SITE_OTHER): Payer: 59

## 2018-02-06 DIAGNOSIS — E0500 Thyrotoxicosis with diffuse goiter without thyrotoxic crisis or storm: Secondary | ICD-10-CM

## 2018-02-06 LAB — T4, FREE: Free T4: 0.77 ng/dL (ref 0.60–1.60)

## 2018-02-06 LAB — T3, FREE: T3 FREE: 3.6 pg/mL (ref 2.3–4.2)

## 2018-02-06 LAB — TSH: TSH: 1.69 u[IU]/mL (ref 0.35–4.50)

## 2018-02-06 NOTE — Progress Notes (Deleted)
Office Visit Note  Patient: Cynthia Fox             Date of Birth: 04/26/1970           MRN: [id number]             PCP: Wanda Plump, MD Referring: Wanda Plump, MD Visit Date: 02/20/2018 Occupation: @GUAROCC @  Subjective:  No chief complaint on file.  Current regimen includes Plaquenil 200 mg po daily and pilocarpine 5 mg po three times daily as needed.  Prior therapy included Norvasc.  Last PLQ eye exam 05/15/17.  Most recent CBC/CMP stable on 08/15/17.  CBC/CMP due today and then every 5 months to monitor for drug toxicity.  Standing orders placed.  Recommend annual flu and Pneumovax 23 vaccine.  Recommend routine dental exams.  History of Present Illness: Cynthia Fox is a 47 y.o. female  with history of Sjogren's and Raynaud's.  Activities of Daily Living:  Patient reports morning stiffness for *** {minute/hour:19697}.   Patient {ACTIONS;DENIES/REPORTS:21021675::"Denies"} nocturnal pain.  Difficulty dressing/grooming: {ACTIONS;DENIES/REPORTS:21021675::"Denies"} Difficulty climbing stairs: {ACTIONS;DENIES/REPORTS:21021675::"Denies"} Difficulty getting out of chair: {ACTIONS;DENIES/REPORTS:21021675::"Denies"} Difficulty using hands for taps, buttons, cutlery, and/or writing: {ACTIONS;DENIES/REPORTS:21021675::"Denies"}  No Rheumatology ROS completed.   PMFS History:  Patient Active Problem List   Diagnosis Date Noted  . 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 Annual physical exam 10/24/2016  . Menopause 10/13/2016  . H/O seasonal allergies 03/23/2016  . Vitamin D deficiency 03/23/2016  . High risk medication use 03/22/2016  . Sjogren's syndrome (HCC) 06/25/2015  . PCP NOTES >>>>>>>>>>>>>>>>>>>>>> 06/25/2015  . 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 Hyperprolactinemia (HCC) 01/12/2015  . Elevated antinuclear antibody (ANA) level 07/23/2014    Past Medical History:  Diagnosis Date  . Compressive optic neuropathy   . Hepatitis A   . Seasonal allergies   . Sjogren's disease (HCC)    positive ANA nad Ro  . Thyroid eye disease     Family History  Problem Relation Age of Onset  . Hypertension Mother   . Retinal detachment Mother   . Thyroid disease Mother   . Diabetes Father   . Thyroid disease Sister   . Heart attack Maternal Grandfather   . Breast cancer Neg Hx   . Colon cancer Neg Hx    Past Surgical History:  Procedure Laterality Date  . EYE SURGERY Bilateral 2017, 2018   release of optic pressure   . FUNCTIONAL ENDOSCOPIC SINUS SURGERY Bilateral 05/2015  . Orbital Decompression Bilateral 05/2015   Social History   Social History Narrative   Original from [place]    Lives w/ husband    Objective: Vital Signs: There were no vitals taken for this visit.   Physical Exam   Musculoskeletal Exam: ***  CDAI Exam: CDAI Score: Not documented Patient Global Assessment: Not documented; Provider Global Assessment: Not documented Swollen: Not documented; Tender: Not documented Joint Exam   Not documented   There is currently no information documented on the homunculus. Go to the Rheumatology activity and complete the homunculus joint exam.  Investigation: No additional findings.  Imaging: Mm 3d Screen Breast Bilateral  Result Date: 01/16/2018 CLINICAL DATA:  Screening. EXAM: DIGITAL SCREENING BILATERAL MAMMOGRAM WITH TOMO AND CAD COMPARISON:  Previous exam(s). ACR Breast Density Category c: The breast tissue is heterogeneously dense, which may obscure small masses. FINDINGS: There are no findings suspicious for malignancy. Images were processed with CAD. IMPRESSION: No mammographic evidence of malignancy. A result letter of this screening mammogram will be mailed directly to  the patient. RECOMMENDATION: Screening mammogram in one year. (Code:[id number]-B-01Y) BI-RADS CATEGORY  1: Negative. Electronically Signed   By: Beckie Salts M.D.   On: 01/16/2018 12:49    Recent Labs: Lab Results  Component Value Date   WBC 6.0 08/15/2017   HGB 13.4 08/15/2017   PLT 278  08/15/2017   NA 139 08/15/2017   K 4.0 08/15/2017   CL 100 08/15/2017   CO2 32 08/15/2017   GLUCOSE 93 08/15/2017   BUN 7 08/15/2017   CREATININE 0.76 08/15/2017   BILITOT 0.7 08/15/2017   ALKPHOS 104 11/21/2016   AST 35 08/15/2017   ALT 29 08/15/2017   PROT 7.7 08/15/2017   ALBUMIN 4.3 11/21/2016   CALCIUM 9.8 08/15/2017   GFRAA 107 08/15/2017    Speciality Comments: PLQ Eye Exam: 05/15/17 WNL @ [facility] Follow up in 1 year  Procedures:  No procedures performed Allergies: Patient has no known allergies.   Assessment / Plan:     Visit Diagnoses: No diagnosis found.   Orders: No orders of the defined types were placed in this encounter.  No orders of the defined types were placed in this encounter.   Face-to-face time spent with patient was *** minutes. Greater than 50% of time was spent in counseling and coordination of care.  Follow-Up Instructions: No follow-ups on file.   Ellen Henri, CMA  Note - This record has been created using Animal nutritionist.  Chart creation errors have been sought, but may not always  have been located. Such creation errors do not reflect on  the standard of medical care.

## 2018-02-20 ENCOUNTER — Ambulatory Visit: Payer: 59 | Admitting: Rheumatology

## 2018-02-20 NOTE — Progress Notes (Signed)
Office Visit Note  Patient: Cynthia Fox             Date of Birth: 21-Jun-1970           MRN: [id number]             PCP: Wanda Plump, MD Referring: Wanda Plump, MD Visit Date: 03/06/2018 Occupation: @GUAROCC @  Subjective:  Pain in both hands.    History of Present Illness: Cynthia Fox is a 47 y.o. female with history of Sjogren's and Raynaud's.  According to patient she continues to have dry mouth.  The pilocarpine has been helpful.  She has been also taking Plaquenil.  She continues to have pain and discomfort in her bilateral hands.  Also experiencing some discomfort in her right shoulder is working on the task is difficult for her.  Her Raynauds is not very active currently.  She occasionally have lower back pain.  She states this appears to be more muscular.  Activities of Daily Living:  Patient reports morning stiffness for 1 hour.   Patient Denies nocturnal pain.  Difficulty dressing/grooming: Reports Difficulty climbing stairs: Denies Difficulty getting out of chair: Denies Difficulty using hands for taps, buttons, cutlery, and/or writing: Reports  Review of Systems  Constitutional: Negative for fatigue.  HENT: Positive for mouth dryness. Negative for mouth sores, trouble swallowing and trouble swallowing.   Eyes: Positive for dryness. Negative for pain, redness and itching.  Respiratory: Negative for shortness of breath, wheezing and difficulty breathing.   Cardiovascular: Negative for chest pain, palpitations and swelling in legs/feet.  Gastrointestinal: Negative for abdominal pain, blood in stool, constipation, diarrhea and heartburn.  Endocrine: Negative for increased urination.  Genitourinary: Negative for painful urination, nocturia and pelvic pain.  Musculoskeletal: Positive for arthralgias, joint pain and morning stiffness. Negative for joint swelling.  Skin: Negative for rash and hair loss.  Allergic/Immunologic: Negative for susceptible to infections.    Neurological: Negative for dizziness, light-headedness, headaches, memory loss and weakness.  Hematological: Negative for bruising/bleeding tendency.  Psychiatric/Behavioral: Negative for confusion. The patient is not nervous/anxious.     PMFS History:  Patient Active Problem List   Diagnosis Date Noted  . 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 Annual physical exam 10/24/2016  . Menopause 10/13/2016  . H/O seasonal allergies 03/23/2016  . Vitamin D deficiency 03/23/2016  . High risk medication use 03/22/2016  . Sjogren's syndrome (HCC) 06/25/2015  . PCP NOTES >>>>>>>>>>>>>>>>>>>>>> 06/25/2015  . Graves disease 06/01/2015  . Hyperprolactinemia (HCC) 01/12/2015  . Elevated antinuclear antibody (ANA) level 07/23/2014    Past Medical History:  Diagnosis Date  . Compressive optic neuropathy   . Hepatitis A   . Seasonal allergies   . Sjogren's disease (HCC)    positive ANA nad Ro  . Thyroid eye disease     Family History  Problem Relation Age of Onset  . Hypertension Mother   . Retinal detachment Mother   . Thyroid disease Mother   . Diabetes Father   . Thyroid disease Sister   . Heart attack Maternal Grandfather   . Breast cancer Neg Hx   . Colon cancer Neg Hx    Past Surgical History:  Procedure Laterality Date  . EYE SURGERY Bilateral 2017, 2018   release of optic pressure   . FUNCTIONAL ENDOSCOPIC SINUS SURGERY Bilateral 05/2015  . Orbital Decompression Bilateral 05/2015   Social History   Social History Narrative   Original from [employer]    Lives w/ husband  Objective: Vital Signs: BP 115/74 (BP Location: Left Arm, Patient Position: Sitting, Cuff Size: Normal)   Pulse 86   Resp 13   Ht 5' (1.524 m)   Wt 147 lb 12.8 oz (67 kg)   BMI 28.87 kg/m    Physical Exam  Constitutional: She is oriented to person, place, and time. She appears well-developed and well-nourished.  HENT:  Head: Normocephalic and atraumatic.  Eyes: Conjunctivae and EOM are normal.  Neck:  Normal range of motion.  Cardiovascular: Normal rate, regular rhythm, normal heart sounds and intact distal pulses.  Pulmonary/Chest: Effort normal and breath sounds normal.  Abdominal: Soft. Bowel sounds are normal.  Lymphadenopathy:    She has no cervical adenopathy.  Neurological: She is alert and oriented to person, place, and time.  Skin: Skin is warm and dry. Capillary refill takes less than 2 seconds.  Psychiatric: She has a normal mood and affect. Her behavior is normal.  Nursing note and vitals reviewed.    Musculoskeletal Exam: C-spine thoracic lumbar spine good range of motion.  She has discomfort range of motion of her lumbar spine.  She has some tenderness over paravertebral lumbar muscles.  Shoulder joints elbow joints wrist joint MCPs PIPs were in good range of motion with no synovitis.  Hip joints knee joints ankles MTPs PIPs were in good range of motion with no synovitis.  CDAI Exam: CDAI Score: Not documented Patient Global Assessment: Not documented; Provider Global Assessment: Not documented Swollen: Not documented; Tender: Not documented Joint Exam   Not documented   There is currently no information documented on the homunculus. Go to the Rheumatology activity and complete the homunculus joint exam.  Investigation: No additional findings.  Imaging: No results found.  Recent Labs: Lab Results  Component Value Date   WBC 6.0 08/15/2017   HGB 13.4 08/15/2017   PLT 278 08/15/2017   NA 139 08/15/2017   K 4.0 08/15/2017   CL 100 08/15/2017   CO2 32 08/15/2017   GLUCOSE 93 08/15/2017   BUN 7 08/15/2017   CREATININE 0.76 08/15/2017   BILITOT 0.7 08/15/2017   ALKPHOS 104 11/21/2016   AST 35 08/15/2017   ALT 29 08/15/2017   PROT 7.7 08/15/2017   ALBUMIN 4.3 11/21/2016   CALCIUM 9.8 08/15/2017   GFRAA 107 08/15/2017    Speciality Comments: PLQ Eye Exam: 05/15/17 WNL @ [facility] Follow up in 1 year  Procedures:  No procedures performed Allergies:  Patient has no known allergies.   Assessment / Plan:     Visit Diagnoses: Sjogren's syndrome with keratoconjunctivitis sicca (HCC) - Positive ANA, positive Ro, positive rheumatoid factor, negative CCP.  She is been having dry mouth and dry eyes.  She is using pilocarpine which has been helpful.  For dry eyes over-the-counter eyedrops were discussed. - Plan: Urinalysis, Routine w reflex microscopic, Rheumatoid factor, Anti-DNA antibody, double-stranded, C3 and C4  High risk medication use - PLQ 200 mg p.o. daily and pilocarpine 5 mg p.o. 3 times daily. eye exam: 05/15/2017 - Plan: CBC with Differential/Platelet, COMPLETE METABOLIC PANEL WITH GFR, Serum protein electrophoresis with reflex  Raynaud's disease without gangrene-clothing and protection from cold was discussed.  Chronic midline low back pain without sciatica-she has been having increased lower back pain which appears to be muscular.  Have given her a handout on back exercises.  She is supposed to contact me in case her symptoms do not improve.  Chronic right shoulder pain-she believes the pain is related to her desk  at work.  I will give her a prescription for ergonomic evaluation and standing desk.  Primary insomnia  H/O seasonal allergies  History of Graves' disease  History of vitamin D deficiency -she takes supplement.  Orders: Orders Placed This Encounter  Procedures  . CBC with Differential/Platelet  . COMPLETE METABOLIC PANEL WITH GFR  . Urinalysis, Routine w reflex microscopic  . Rheumatoid factor  . Serum protein electrophoresis with reflex  . Anti-DNA antibody, double-stranded  . C3 and C4   No orders of the defined types were placed in this encounter.   Follow-Up Instructions: Return in about 5 months (around 08/05/2018) for Sjogren's.   Pollyann Savoy, MD  Note - This record has been created using Animal nutritionist.  Chart creation errors have been sought, but may not always  have been located. Such  creation errors do not reflect on  the standard of medical care.

## 2018-02-27 ENCOUNTER — Other Ambulatory Visit: Payer: Self-pay | Admitting: Physician Assistant

## 2018-02-27 MED ORDER — HYDROXYCHLOROQUINE SULFATE 200 MG PO TABS: ORAL_TABLET | ORAL | 2 refills | Status: DC

## 2018-02-27 NOTE — Telephone Encounter (Signed)
Last Visit: 08/15/17  Next visit: 03/06/18 Labs: 08/15/17 stable PLQ Eye Exam: 05/15/17 WNL   Okay to refill per Dr. Corliss Skainseveshwar

## 2018-03-06 ENCOUNTER — Ambulatory Visit: Payer: 59 | Admitting: Rheumatology

## 2018-03-06 ENCOUNTER — Encounter: Payer: Self-pay | Admitting: Rheumatology

## 2018-03-06 VITALS — BP 115/74 | HR 86 | Resp 13 | Ht 60.0 in | Wt 147.8 lb

## 2018-03-06 DIAGNOSIS — Z79899 Other long term (current) drug therapy: Secondary | ICD-10-CM | POA: Diagnosis not present

## 2018-03-06 DIAGNOSIS — Z889 Allergy status to unspecified drugs, medicaments and biological substances status: Secondary | ICD-10-CM

## 2018-03-06 DIAGNOSIS — I7300 Raynaud's syndrome without gangrene: Secondary | ICD-10-CM | POA: Diagnosis not present

## 2018-03-06 DIAGNOSIS — M545 Low back pain: Secondary | ICD-10-CM

## 2018-03-06 DIAGNOSIS — M3501 Sicca syndrome with keratoconjunctivitis: Secondary | ICD-10-CM | POA: Diagnosis not present

## 2018-03-06 DIAGNOSIS — F5101 Primary insomnia: Secondary | ICD-10-CM

## 2018-03-06 DIAGNOSIS — M25511 Pain in right shoulder: Secondary | ICD-10-CM

## 2018-03-06 DIAGNOSIS — G8929 Other chronic pain: Secondary | ICD-10-CM

## 2018-03-06 DIAGNOSIS — Z8639 Personal history of other endocrine, nutritional and metabolic disease: Secondary | ICD-10-CM

## 2018-03-06 NOTE — Patient Instructions (Signed)

## 2018-03-08 LAB — COMPLETE METABOLIC PANEL WITH GFR
AG RATIO: 1.6 (calc) (ref 1.0–2.5)
ALBUMIN MSPROF: 4.2 g/dL (ref 3.6–5.1)
ALT: 21 U/L (ref 6–29)
AST: 23 U/L (ref 10–35)
Alkaline phosphatase (APISO): 111 U/L (ref 33–115)
BILIRUBIN TOTAL: 0.7 mg/dL (ref 0.2–1.2)
BUN: 11 mg/dL (ref 7–25)
CHLORIDE: 106 mmol/L (ref 98–110)
CO2: 30 mmol/L (ref 20–32)
Calcium: 8.9 mg/dL (ref 8.6–10.2)
Creat: 0.75 mg/dL (ref 0.50–1.10)
GFR, EST AFRICAN AMERICAN: 108 mL/min/{1.73_m2} (ref >=60)
GFR, Est Non African American: 94 mL/min/{1.73_m2} (ref >=60)
Globulin: 2.7 g/dL (calc) (ref 1.9–3.7)
Glucose, Bld: 94 mg/dL (ref 65–99)
POTASSIUM: 4 mmol/L (ref 3.5–5.3)
Sodium: 141 mmol/L (ref 135–146)
TOTAL PROTEIN: 6.9 g/dL (ref 6.1–8.1)

## 2018-03-08 LAB — CBC WITH DIFFERENTIAL/PLATELET
BASOS ABS: 70 {cells}/uL (ref 0–200)
BASOS PCT: 1.2 %
Eosinophils Absolute: 545 cells/uL — ABNORMAL HIGH (ref 15–500)
Eosinophils Relative: 9.4 %
HCT: 37.3 % (ref 35.0–45.0)
HEMOGLOBIN: 12.7 g/dL (ref 11.7–15.5)
Lymphs Abs: 1804 cells/uL (ref 850–3900)
MCH: 34.4 pg — AB (ref 27.0–33.0)
MCHC: 34 g/dL (ref 32.0–36.0)
MCV: 101.1 fL — ABNORMAL HIGH (ref 80.0–100.0)
MPV: 12.1 fL (ref 7.5–12.5)
Monocytes Relative: 8.3 %
NEUTROS ABS: 2900 {cells}/uL (ref 1500–7800)
Neutrophils Relative %: 50 %
Platelets: 271 10*3/uL (ref 140–400)
RBC: 3.69 10*6/uL — ABNORMAL LOW (ref 3.80–5.10)
RDW: 12.6 % (ref 11.0–15.0)
TOTAL LYMPHOCYTE: 31.1 %
WBC mixed population: 481 cells/uL (ref 200–950)
WBC: 5.8 10*3/uL (ref 3.8–10.8)

## 2018-03-08 LAB — PROTEIN ELECTROPHORESIS, SERUM, WITH REFLEX
Albumin ELP: 4.1 g/dL (ref 3.8–4.8)
Alpha 1: 0.2 g/dL (ref 0.2–0.3)
Alpha 2: 0.6 g/dL (ref 0.5–0.9)
BETA 2: 0.5 g/dL (ref 0.2–0.5)
Beta Globulin: 0.5 g/dL (ref 0.4–0.6)
Gamma Globulin: 1.2 g/dL (ref 0.8–1.7)
TOTAL PROTEIN: 7.1 g/dL (ref 6.1–8.1)

## 2018-03-08 LAB — URINALYSIS, ROUTINE W REFLEX MICROSCOPIC
BILIRUBIN URINE: NEGATIVE
Glucose, UA: NEGATIVE
Hgb urine dipstick: NEGATIVE
KETONES UR: NEGATIVE
Leukocytes, UA: NEGATIVE
Nitrite: NEGATIVE
PROTEIN: NEGATIVE
Specific Gravity, Urine: 1.019 (ref 1.001–1.03)
pH: >=8.5 — AB (ref 5.0–8.0)

## 2018-03-08 LAB — ANTI-DNA ANTIBODY, DOUBLE-STRANDED

## 2018-03-08 LAB — C3 AND C4
C3 Complement: 114 mg/dL (ref 83–193)
C4 COMPLEMENT: 25 mg/dL (ref 15–57)

## 2018-03-08 LAB — RHEUMATOID FACTOR

## 2018-03-12 NOTE — Progress Notes (Signed)
stable °

## 2018-03-29 ENCOUNTER — Encounter: Payer: Self-pay | Admitting: Obstetrics & Gynecology

## 2018-03-29 ENCOUNTER — Ambulatory Visit (INDEPENDENT_AMBULATORY_CARE_PROVIDER_SITE_OTHER): Payer: 59 | Admitting: Obstetrics & Gynecology

## 2018-03-29 VITALS — BP 112/70 | Ht <= 58 in | Wt 145.0 lb

## 2018-03-29 DIAGNOSIS — Z01419 Encounter for gynecological examination (general) (routine) without abnormal findings: Secondary | ICD-10-CM

## 2018-03-29 DIAGNOSIS — Z780 Asymptomatic menopausal state: Secondary | ICD-10-CM

## 2018-03-29 DIAGNOSIS — N952 Postmenopausal atrophic vaginitis: Secondary | ICD-10-CM | POA: Diagnosis not present

## 2018-03-29 DIAGNOSIS — E6609 Other obesity due to excess calories: Secondary | ICD-10-CM

## 2018-03-29 DIAGNOSIS — Z6830 Body mass index (BMI) 30.0-30.9, adult: Secondary | ICD-10-CM

## 2018-03-29 MED ORDER — ESTRADIOL 10 MCG VA TABS: 1.0000 | ORAL_TABLET | VAGINAL | 4 refills | Status: DC

## 2018-03-29 NOTE — Patient Instructions (Signed)
1. Encounter for routine gynecological examination with Papanicolaou smear of cervix Normal gynecologic exam in menopause.  Pap reflex done.  Breast exam normal.  Screening mammogram in October 2019 was negative.  Health labs with Dr. Drue NovelPaz.  2. Menopause present Well on no hormone replacement therapy.  No postmenopausal bleeding.  Recommend vitamin D supplements, calcium intake of 1.5 g/day and regular weightbearing physical activities.  3. Post-menopausal atrophic vaginitis Will continue on Vagifem twice weekly.  Recommend coconut oil during intercourse.  4. Class 1 obesity due to excess calories without serious comorbidity with body mass index (BMI) of 30.0 to 30.9 in adult Low calorie/carb diet such as [employer] diet recommended.  Increase physical activity with aerobic activities 5 times a week and weightlifting every 2 days.  Other orders - Calcium Carbonate-Vit D-Min (CALCIUM 1200 PO); Take by mouth. - Ascorbic Acid (VITAMIN C) 100 MG tablet; Take 100 mg by mouth daily. - Estradiol 10 MCG TABS vaginal tablet; Place 1 tablet (10 mcg total) vaginally 2 (two) times a week.  Amil AmenJulia, fue un placer encontrarle hoy!  Voy a informarle de sus CD[employer] Corporationresultados muy pronto.

## 2018-03-29 NOTE — Addendum Note (Signed)
Addended by: Ayonna Speranza A on: 03/29/2018 04:27 PM   Modules accepted: Orders  

## 2018-03-29 NOTE — Progress Notes (Signed)
Cynthia AllegraJulia A Biehler 01/12/1971 [id number]   History:    47 y.o. G0 Married  RP:  Established patient presenting for annual gyn exam   HPI: Menopause, well on no hormone replacement therapy.  No postmenopausal bleeding.  No pelvic pain.  Dryness with intercourse in spite of using Vagifem.  Not using a lubricant.  Urine and bowel movements normal.  Breasts normal.  Body mass index 30.31.  Was walking regularly, but with the cold weather stopped doing it.  Health labs with Dr. Drue NovelPaz.  Past medical history,surgical history, family history and social history were all reviewed and documented in the EPIC chart.  Gynecologic History No LMP recorded. Patient is perimenopausal. Contraception: post menopausal status Last Pap: 07/2014. Results were: Negative/HPV HR neg Last mammogram: 01/2018. Results were: Negative Bone Density: Never Colonoscopy: Never  Obstetric History OB History  Gravida Para Term Preterm AB Living  0 0 0 0 0 0  SAB TAB Ectopic Multiple Live Births  0 0 0 0       ROS: A ROS was performed and pertinent positives and negatives are included in the history.  GENERAL: No fevers or chills. HEENT: No change in vision, no earache, sore throat or sinus congestion. NECK: No pain or stiffness. CARDIOVASCULAR: No chest pain or pressure. No palpitations. PULMONARY: No shortness of breath, cough or wheeze. GASTROINTESTINAL: No abdominal pain, nausea, vomiting or diarrhea, melena or bright red blood per rectum. GENITOURINARY: No urinary frequency, urgency, hesitancy or dysuria. MUSCULOSKELETAL: No joint or muscle pain, no back pain, no recent trauma. DERMATOLOGIC: No rash, no itching, no lesions. ENDOCRINE: No polyuria, polydipsia, no heat or cold intolerance. No recent change in weight. HEMATOLOGICAL: No anemia or easy bruising or bleeding. NEUROLOGIC: No headache, seizures, numbness, tingling or weakness. PSYCHIATRIC: No depression, no loss of interest in normal activity or change in sleep  pattern.     Exam:   BP 112/70   Ht 4\' 10"  (1.473 m)   Wt 145 lb (65.8 kg)   BMI 30.31 kg/m   Body mass index is 30.31 kg/m.  General appearance : Well developed well nourished female. No acute distress HEENT: Eyes: no retinal hemorrhage or exudates,  Neck supple, trachea midline, no carotid bruits, no thyroidmegaly Lungs: Clear to auscultation, no rhonchi or wheezes, or rib retractions  Heart: Regular rate and rhythm, no murmurs or gallops Breast:Examined in sitting and supine position were symmetrical in appearance, no palpable masses or tenderness,  no skin retraction, no nipple inversion, no nipple discharge, no skin discoloration, no axillary or supraclavicular lymphadenopathy Abdomen: no palpable masses or tenderness, no rebound or guarding Extremities: no edema or skin discoloration or tenderness  Pelvic: Vulva: Normal             Vagina: No gross lesions or discharge  Cervix: No gross lesions or discharge.  Pap reflex done  Uterus  AV, normal size, shape and consistency, non-tender and mobile  Adnexa  Without masses or tenderness  Anus: Normal   Assessment/Plan:  47 y.o. female for annual exam   1. Encounter for routine gynecological examination with Papanicolaou smear of cervix Normal gynecologic exam in menopause.  Pap reflex done.  Breast exam normal.  Screening mammogram in October 2019 was negative.  Health labs with Dr. Drue NovelPaz.  2. Menopause present Well on no hormone replacement therapy.  No postmenopausal bleeding.  Recommend vitamin D supplements, calcium intake of 1.5 g/day and regular weightbearing physical activities.  3. Post-menopausal atrophic vaginitis Will continue on  Vagifem twice weekly.  Recommend coconut oil during intercourse.  4. Class 1 obesity due to excess calories without serious comorbidity with body mass index (BMI) of 30.0 to 30.9 in adult Low calorie/carb diet such as [employer] [employer] diet recommended.  Increase physical activity with aerobic  activities 5 times a week and weightlifting every 2 days.  Other orders - Calcium Carbonate-Vit D-Min (CALCIUM 1200 PO); Take by mouth. - Ascorbic Acid (VITAMIN C) 100 MG tablet; Take 100 mg by mouth daily. - Estradiol 10 MCG TABS vaginal tablet; Place 1 tablet (10 mcg total) vaginally 2 (two) times a week.  Genia DelMarie-Lyne Adarryl Goldammer MD, 3:46 PM 03/29/2018

## 2018-04-02 LAB — PAP IG W/ RFLX HPV ASCU

## 2018-04-05 ENCOUNTER — Other Ambulatory Visit (INDEPENDENT_AMBULATORY_CARE_PROVIDER_SITE_OTHER): Payer: 59

## 2018-04-05 DIAGNOSIS — Z0000 Encounter for general adult medical examination without abnormal findings: Secondary | ICD-10-CM | POA: Diagnosis not present

## 2018-04-05 LAB — FECAL OCCULT BLOOD, IMMUNOCHEMICAL: Fecal Occult Bld: NEGATIVE

## 2018-06-11 ENCOUNTER — Other Ambulatory Visit: Payer: Self-pay | Admitting: Rheumatology

## 2018-06-11 NOTE — Telephone Encounter (Signed)
Last Visit: 03/07/19 Next Visit: 08/07/18 Labs: 03/07/19 stable PLQ Eye Exam: 05/15/17 WNL  Left message to remind patient she is due to update PLQ eye exam.   Okay to refill per Dr. Corliss Skains

## 2018-07-02 ENCOUNTER — Ambulatory Visit: Payer: 59 | Admitting: Internal Medicine

## 2018-07-02 ENCOUNTER — Other Ambulatory Visit: Payer: Self-pay

## 2018-07-02 ENCOUNTER — Encounter: Payer: Self-pay | Admitting: Internal Medicine

## 2018-07-02 VITALS — BP 118/64 | HR 83 | Temp 98.0°F | Resp 16 | Ht <= 58 in | Wt 145.2 lb

## 2018-07-02 DIAGNOSIS — R1013 Epigastric pain: Secondary | ICD-10-CM

## 2018-07-02 MED ORDER — OMEPRAZOLE 40 MG PO CPDR: 40.0000 mg | DELAYED_RELEASE_CAPSULE | Freq: Every day | ORAL | 1 refills | Status: DC

## 2018-07-02 NOTE — Patient Instructions (Addendum)
GO TO THE LAB : Provide a sample   GO TO THE FRONT DESK Schedule your next appointment a checkup in 8 weeks  Omeprazole 40 mg: 1 tablet before breakfast for 6 weeks, then stop

## 2018-07-02 NOTE — Progress Notes (Signed)
Subjective:    Patient ID: Cynthia Fox, female    DOB: Aug 12, 1970, 48 y.o.   MRN: [id number]  DOS:  07/02/2018 Type of visit - description: Acute Symptoms started ~ 3 weeks ago, described as a burning at the epigastric area. In the last few days sxs have increased, now also reports early satiety and bloating with food intake. Denies any particular pain in the RUQ area. About 3 months ago she had a dental pain took OTC Aleve for few days but no NSAIDs since then Her husband was recently diagnosed and treated for H. pylori, he had upper GI symptoms, he was seen at this office.  Review of Systems Denies fever chills No nausea, vomiting, diarrhea. No blood in the stools No heartburn per se, no dysphagia or odynophagia  Past Medical History:  Diagnosis Date  . Compressive optic neuropathy   . Hepatitis A   . Seasonal allergies   . Sjogren's disease (HCC)    positive ANA nad Ro  . Thyroid eye disease     Past Surgical History:  Procedure Laterality Date  . EYE SURGERY Bilateral 2017, 2018   release of optic pressure   . FUNCTIONAL ENDOSCOPIC SINUS SURGERY Bilateral 05/2015  . Orbital Decompression Bilateral 05/2015    Social History   Socioeconomic History  . Marital status: Married    Spouse name: Not on file  . Number of children: 0  . Years of education: Not on file  . Highest education level: Not on file  Occupational History  . Occupation: Customer service manager   Social Needs  . Financial resource strain: Not on file  . Food insecurity:    Worry: Not on file    Inability: Not on file  . Transportation needs:    Medical: Not on file    Non-medical: Not on file  Tobacco Use  . Smoking status: Never Smoker  . Smokeless tobacco: Never Used  Substance and Sexual Activity  . Alcohol use: Yes    Alcohol/week: 0.0 standard drinks    Comment: weekends  . Drug use: No  . Sexual activity: Yes    Partners: Male    Comment: 1st intercourse- 38, partners- 4, married- 14  yrs   Lifestyle  . Physical activity:    Days per week: Not on file    Minutes per session: Not on file  . Stress: Not on file  Relationships  . Social connections:    Talks on phone: Not on file    Gets together: Not on file    Attends religious service: Not on file    Active member of club or organization: Not on file    Attends meetings of clubs or organizations: Not on file    Relationship status: Not on file  . Intimate partner violence:    Fear of current or ex partner: Not on file    Emotionally abused: Not on file    Physically abused: Not on file    Forced sexual activity: Not on file  Other Topics Concern  . Not on file  Social History Narrative   Original from [place]    Lives w/ husband      Allergies as of 07/02/2018   No Known Allergies     Medication List       Accurate as of July 02, 2018  3:25 PM. Always use your most recent med list.        CALCIUM 1200 PO Take by mouth.   Estradiol  10 MCG Tabs vaginal tablet Place 1 tablet (10 mcg total) vaginally 2 (two) times a week.   hydroxychloroquine 200 MG tablet Commonly known as:  PLAQUENIL TAKE 1 TABLET(200 MG) BY MOUTH DAILY   omeprazole 40 MG capsule Commonly known as:  PRILOSEC Take 1 capsule (40 mg total) by mouth daily before breakfast.   pilocarpine 5 MG tablet Commonly known as:  SALAGEN Take 1 tablet (5 mg total) by mouth 3 (three) times daily as needed.   Selenium 200 MCG Caps Take 200 mcg by mouth daily.   vitamin C 100 MG tablet Take 100 mg by mouth daily.   Vitamin D3 125 MCG (5000 UT) Caps Take 1 capsule by mouth 2 (two) times a week.           Objective:   Physical Exam BP 118/64 (BP Location: Left Arm, Patient Position: Sitting, Cuff Size: Small)   Pulse 83   Temp 98 F (36.7 C) (Oral)   Resp 16   Ht 4\' 10"  (1.473 m)   Wt 145 lb 4 oz (65.9 kg)   SpO2 98%   BMI 30.36 kg/m  General:   Well developed, NAD, BMI noted.  HEENT:  Normocephalic . Face symmetric,  atraumatic Lungs:  CTA B Normal respiratory effort, no intercostal retractions, no accessory muscle use. Heart: RRR,  no murmur.  no pretibial edema bilaterally  Abdomen:  Not distended, soft, non-tender. No rebound or rigidity.   Skin: Not pale. Not jaundice Neurologic:  alert & oriented X3.  Speech normal, gait appropriate for age and unassisted Psych--  Cognition and judgment appear intact.  Cooperative with normal attention span and concentration.  Behavior appropriate. No anxious or depressed appearing.     Assessment    Assessment ENDO:    Dr Elvera Lennox --Luiz Blare' disease dx 2016, ophthalmopathy dx ~ 04-2015 , had diplopia, s/p orbital decompression surgery ~ 05-2015   --h/o hyperprolactinemia  RHEUM:  Dr Victory Dakin  Sjogren syndrome  Dx 2016  --  + ANA Menopausal   PLAN: Dyspepsia: Symptoms as described above with no red flags such as fever, chills, weight loss, dysphasia.  Recently husband was dx with H. pylori. Patient herself has not taken PPIs.  We discussed different options such as empiric treatment with PPI and reassess in few weeks versus testing for H. pylori.   Patient prefers this time. Plan: H. pylori breathing test, further advised with results. Start empiric omeprazole 40 mg, prescription sent RTC 2 months

## 2018-07-02 NOTE — Progress Notes (Signed)
Pre visit review using our clinic review tool, if applicable. No additional management support is needed unless otherwise documented below in the visit note. 

## 2018-07-02 NOTE — Assessment & Plan Note (Signed)
Dyspepsia: Symptoms as described above with no red flags such as fever, chills, weight loss, dysphasia.  Recently husband was dx with H. pylori. Patient herself has not taken PPIs.  We discussed different options such as empiric treatment with PPI and reassess in few weeks versus testing for H. pylori.   Patient prefers this time. Plan: H. pylori breathing test, further advised with results. Start empiric omeprazole 40 mg, prescription sent RTC 2 months

## 2018-07-03 ENCOUNTER — Other Ambulatory Visit: Payer: Self-pay | Admitting: Internal Medicine

## 2018-07-03 LAB — H. PYLORI BREATH TEST: H. PYLORI BREATH TEST: DETECTED — AB

## 2018-07-03 MED ORDER — CLARITHROMYCIN 500 MG PO TABS: 500.0000 mg | ORAL_TABLET | Freq: Two times a day (BID) | ORAL | 0 refills | Status: DC

## 2018-07-03 MED ORDER — AMOXICILLIN 500 MG PO CAPS: 1000.0000 mg | ORAL_CAPSULE | Freq: Two times a day (BID) | ORAL | 0 refills | Status: DC

## 2018-07-10 ENCOUNTER — Other Ambulatory Visit: Payer: Self-pay | Admitting: Rheumatology

## 2018-07-10 DIAGNOSIS — M3501 Sicca syndrome with keratoconjunctivitis: Secondary | ICD-10-CM

## 2018-07-10 NOTE — Telephone Encounter (Signed)
Last Visit: 03/07/19 Next Visit: 08/07/18 Labs: 03/07/19 stable PLQ Eye Exam: 05/15/17 WNL  Okay to refill per Dr. Corliss Skains

## 2018-08-06 ENCOUNTER — Encounter: Payer: Self-pay | Admitting: Internal Medicine

## 2018-08-06 ENCOUNTER — Ambulatory Visit (INDEPENDENT_AMBULATORY_CARE_PROVIDER_SITE_OTHER): Payer: 59 | Admitting: Internal Medicine

## 2018-08-06 ENCOUNTER — Other Ambulatory Visit: Payer: Self-pay

## 2018-08-06 ENCOUNTER — Ambulatory Visit: Payer: 59 | Admitting: Internal Medicine

## 2018-08-06 DIAGNOSIS — E0500 Thyrotoxicosis with diffuse goiter without thyrotoxic crisis or storm: Secondary | ICD-10-CM | POA: Diagnosis not present

## 2018-08-06 DIAGNOSIS — E221 Hyperprolactinemia: Secondary | ICD-10-CM

## 2018-08-06 NOTE — Progress Notes (Signed)
Patient ID: Cynthia Fox, female   DOB: April 22, 1970, 48 y.o.   MRN: [id number]  Patient location: Home My location: Office  Referring Provider: Wanda Plump, MD  I connected with the patient on 08/06/18 at  9:20 AM EDT by a video enabled telemedicine application and verified that I am speaking with the correct person.   I discussed the limitations of evaluation and management by telemedicine and the availability of in person appointments. The patient expressed understanding and agreed to proceed.   Details of the encounter are shown below.   HPI  DEWANNA PORGES is a 48 y.o.-year-old female, initially referred by Dr Lily Peer, returning for Graves ds, Graves ophthalmopathy, and elevated prolactin. Last visit  1 yearago.  Graves ds:  Reviewed history: Pt had a thyroid uptake in 08/08/2014 (unfortunately, a scan was not done at that time) and this returned at [facility], 28.4% (10-30%).  We started MMI and the dose was increased to 10 mg in am and 5 mg in pm >> TSH high  (as she did not return for recheck as advised) >> we stopped MMi >> TSH 0.09 >> advised her to restart MMi 5 mg daily >> dose decreased to 2.5 mg in 08/2015. She is off MMI now since 09/2015.  TSIs normalized: Lab Results  Component Value Date   TSI <89 08/07/2017   TSI 351 (H) 07/29/2016   TSI 471 (H) 01/29/2016   TSI 247 (H) 08/14/2015   TSI 297 (H) 09/16/2014   Reviewed TFTs: Lab Results  Component Value Date   TSH 1.69 02/06/2018   TSH 1.78 08/07/2017   TSH 1.60 02/01/2017   TSH 2.80 07/29/2016   TSH 1.13 05/12/2016   TSH 1.53 04/08/2016   TSH 0.810 01/29/2016   TSH 1.27 11/11/2015   TSH 4.35 09/29/2015   TSH 3.89 08/14/2015   FREET4 0.77 02/06/2018   FREET4 0.78 08/07/2017   FREET4 0.84 02/01/2017   FREET4 1.1 07/29/2016   FREET4 0.81 05/12/2016   FREET4 1.03 04/08/2016   FREET4 1.31 01/29/2016   FREET4 0.82 11/11/2015   FREET4 0.73 09/29/2015   FREET4 0.77 08/14/2015   Pt denies: - feeling nodules  in neck - hoarseness - dysphagia - choking - SOB with lying down  Graves ophthalmopathy: 05/18/2015:  Called by Dr. Dione Booze: Patient seen in his office recently for regular eye exam while on Plaquenil. She noticed a visual field defect in one of her eyes and had her come back for repeat. The second visual field was worse. He obtained an MRI of her brain with focus on the pituitary that showed normal brain structures and pituitary gland, however ocular muscles were enlarged. The third visual field was even worse than the previous two. She was referred to [facility] ophthalmology, where she is seeing Dr. Toni Arthurs. She is planning to perform decompression surgery. However, due to the severity of the visual field defects, she recommended pulse steroids with Solu-Medrol. She can do this in our office, however patient would like to have this locally. Dr. Dione Booze called me to see if this can be arranged.   The recommended Solu-Medrol regimen: - 500 mg once a week for 6 weeks, followed by - 250 mg once a week for the next 6 weeks.  I d/w Dr Toni Arthurs and plan was for pt to have decompressive surgery first >> she had this 05/21/2015, and then started the above Solumedrol regimen - last dose 08/2015.   She had significant double vision in the past  and she could only drive with corrective glasses.  However, after her strabismus surgery in 10/2016, she has very little double vision and only when looking to the left.  She does not need prism glasses anymore.  Elevated prolactin (normal: 2.8-29.2)   - latest levels -normal: Lab Results  Component Value Date   PROLACTIN 10.0 08/07/2017   PROLACTIN 11.3 07/29/2016   PROLACTIN 28.5 09/25/2015   PROLACTIN 30.9 01/12/2015   PROLACTIN 39.9 07/22/2014   PROLACTIN 40.3 07/16/2014   Previously: Component     Latest Ref Rng 09/16/2014  Prolactin, Total      28.0  Prolactin, Monomeric     3.2 - 25.2 ng/mL 23.0   No galactorrhea, HAs.  ROS: Constitutional: +  weight gain (4-5 lbs)/no weight loss, no fatigue, + hot flushes, no subjective hypothermia Eyes: no blurry vision, no xerophthalmia ENT: no sore throat, + see HPI Cardiovascular: no CP/no SOB/no palpitations/no leg swelling Respiratory: no cough/no SOB/no wheezing Gastrointestinal: no N/no V/no D/no C/no acid reflux Musculoskeletal: no muscle aches/no joint aches Skin: no rashes, + hair loss Neurological: no tremors/no numbness/no tingling/no dizziness  I reviewed pt's medications, allergies, PMH, social hx, family hx, and changes were documented in the history of present illness. Otherwise, unchanged from my initial visit note.  Past Medical History:  Diagnosis Date  . Compressive optic neuropathy   . Hepatitis A   . Seasonal allergies   . Sjogren's disease (HCC)    positive ANA nad Ro  . Thyroid eye disease    Past Surgical History:  Procedure Laterality Date  . EYE SURGERY Bilateral 2017, 2018   release of optic pressure   . FUNCTIONAL ENDOSCOPIC SINUS SURGERY Bilateral 05/2015  . Orbital Decompression Bilateral 05/2015    History   Social History  . Marital Status: Married    Spouse Name: N/A  . 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 Smoking status: Never Smoker   . Smokeless tobacco: Never Used  . Alcohol Use: Yes     Comment: weekends   Current Outpatient Medications on File Prior to Visit  Medication Sig Dispense Refill  . amoxicillin (AMOXIL) 500 MG capsule Take 2 capsules (1,000 mg total) by mouth 2 (two) times daily. 56 capsule 0  . Ascorbic Acid (VITAMIN C) 100 MG tablet Take 100 mg by mouth daily.    . Calcium Carbonate-Vit D-Min (CALCIUM 1200 PO) Take by mouth.    . Cholecalciferol (VITAMIN D3) 5000 units CAPS Take 1 capsule by mouth 2 (two) times a week.    . clarithromycin (BIAXIN) 500 MG tablet Take 1 tablet (500 mg total) by mouth 2 (two) times daily. 28 tablet 0  . Estradiol 10 MCG TABS  vaginal tablet Place 1 tablet (10 mcg total) vaginally 2 (two) times a week. 24 tablet 4  . hydroxychloroquine (PLAQUENIL) 200 MG tablet TAKE 1 TABLET EVERY DAY 90 tablet 1  . omeprazole (PRILOSEC) 40 MG capsule Take 1 capsule (40 mg total) by mouth daily before breakfast. 30 capsule 1  . pilocarpine (SALAGEN) 5 MG tablet Take 1 tablet (5 mg total) by mouth 3 (three) times daily as needed. 270 tablet 1  . Selenium 200 MCG CAPS Take 200 mcg by mouth daily.     No current facility-administered medications on file prior to visit.    No Known Allergies   Family History  Problem Relation Age of Onset  . Hypertension  Mother   . Retinal detachment Mother   . Thyroid disease Mother   . Diabetes Father   . Thyroid disease Sister   . Heart attack Maternal Grandfather   . Breast cancer Neg Hx   . Colon cancer Neg Hx   - also see history of present illness  PE: There were no vitals taken for this visit. There is no height or weight on file to calculate BMI. Wt Readings from Last 3 Encounters:  07/02/18 145 lb 4 oz (65.9 kg)  03/29/18 145 lb (65.8 kg)  03/06/18 147 lb 12.8 oz (67 kg)   Constitutional:  in NAD  The physical exam was not performed (virtual visit).  ASSESSMENT: 1. Mild Graves ds.  2. Graves ophthalmopathy - had Solumedrol - 500 mg weekly x 6 doses, then 250 mg weekly x 6 doses - last in 08/2015.   3. Elevated prolactin  4.  Hair loss  PLAN:  1. Patient with mild Graves' disease, based on the presence of TSI antibodies and her uptake and scan, however, with very significant Graves' ophthalmopathy, now improved.  Also, her TSI antibodies have improved to normal at last check. -She was on methimazole but was able to come off it 09/2015 with TFTs stable, in the normal range after stopping the medication.  She is also asymptomatic with the exception of hot flashes which are related to menopause.  She continues selenium. -Reviewed latest TFTs which were normal 01/2018 -We  will repeat her TFTs whenever safe to return to the clinic (coronavirus pandemic) -Otherwise, I will see her back in a year  2. GO  -No complaints at this visit -She had significant diplopia, We gave her serial Solu-Medrol injections and she has had eye surgeries (decompression surgery 05/2015, strabismus surgery 10/2016-successful) and corrective eyeglasses.  -We will recheck her TSI antibodies at next lab draw  3. Elevated PRL  -Patient with previously high prolactin levels, normalized at last check, but no medications.  We ruled out macro prolactin. -She is now menopausal -She denies headaches or galactorrhea -We will check another prolactin level when safe to return to the clinic  4.  Hair loss -Discussed about using hair, skin, and nails vitamins -I advised her that this is safe to take, but she needs to remember to stop the medication few days prior to her lab appointment for TFTs  Carlus Pavlov, MD PhD [facility]

## 2018-08-06 NOTE — Patient Instructions (Signed)
Continue Selenium 200 mcg daily.  You can use hair, skin, and nails vitamins but please do not forget to stop these 5 to 7 days before the next set of thyroid labs.  Please come back for a follow-up appointment in 1 year, but for labs in 2-3 months, whenever safe.

## 2018-08-07 ENCOUNTER — Ambulatory Visit: Payer: 59 | Admitting: Rheumatology

## 2018-08-22 DIAGNOSIS — E0500 Thyrotoxicosis with diffuse goiter without thyrotoxic crisis or storm: Secondary | ICD-10-CM | POA: Diagnosis not present

## 2018-08-22 DIAGNOSIS — H16223 Keratoconjunctivitis sicca, not specified as Sjogren's, bilateral: Secondary | ICD-10-CM | POA: Diagnosis not present

## 2018-08-22 DIAGNOSIS — Z09 Encounter for follow-up examination after completed treatment for conditions other than malignant neoplasm: Secondary | ICD-10-CM | POA: Diagnosis not present

## 2018-09-04 ENCOUNTER — Other Ambulatory Visit: Payer: Self-pay

## 2018-09-04 ENCOUNTER — Ambulatory Visit: Payer: 59 | Admitting: Internal Medicine

## 2018-09-07 ENCOUNTER — Ambulatory Visit (INDEPENDENT_AMBULATORY_CARE_PROVIDER_SITE_OTHER): Payer: 59 | Admitting: Internal Medicine

## 2018-09-07 ENCOUNTER — Other Ambulatory Visit: Payer: Self-pay

## 2018-09-07 DIAGNOSIS — A048 Other specified bacterial intestinal infections: Secondary | ICD-10-CM | POA: Diagnosis not present

## 2018-09-07 NOTE — Progress Notes (Signed)
Subjective:    Patient ID: Cynthia Fox, female    DOB: 25-Sep-1970, 48 y.o.   MRN: [id number]  DOS:  09/07/2018 Type of visit - description: Virtual Visit via Video Note  I connected with@ on 09/08/18 at  3:20 PM EDT by a video enabled telemedicine application and verified that I am speaking with the correct person using two identifiers.   THIS ENCOUNTER IS A VIRTUAL VISIT DUE TO COVID-19 - PATIENT WAS NOT SEEN IN THE OFFICE. PATIENT HAS CONSENTED TO VIRTUAL VISIT / TELEMEDICINE VISIT   Location of patient: home  Location of provider: office  I discussed the limitations of evaluation and management by telemedicine and the availability of in person appointments. The patient expressed understanding and agreed to proceed.  History of Present Illness: See last visit Had dyspepsia, H. pylori tested positive, was treated with antibiotics. Since then she is much improved, essentially back to normal.  Review of Systems Denies fever chills No chest pain no difficulty breathing No nausea, vomiting, diarrhea.  No blood in the stools.  No cough  Past Medical History:  Diagnosis Date  . Compressive optic neuropathy   . Hepatitis A   . Seasonal allergies   . Sjogren's disease (HCC)    positive ANA nad Ro  . Thyroid eye disease     Past Surgical History:  Procedure Laterality Date  . EYE SURGERY Bilateral 2017, 2018   release of optic pressure   . FUNCTIONAL ENDOSCOPIC SINUS SURGERY Bilateral 05/2015  . Orbital Decompression Bilateral 05/2015    Social History   Socioeconomic History  . Marital status: Married    Spouse name: Not on file  . Number of children: 0  . Years of education: Not on file  . Highest education level: Not on file  Occupational History  . Occupation: Customer service manager   Social Needs  . Financial resource strain: Not on file  . Food insecurity:    Worry: Not on file    Inability: Not on file  . Transportation needs:    Medical: Not on file   Non-medical: Not on file  Tobacco Use  . Smoking status: Never Smoker  . Smokeless tobacco: Never Used  Substance and Sexual Activity  . Alcohol use: Yes    Alcohol/week: 0.0 standard drinks    Comment: weekends  . Drug use: No  . Sexual activity: Yes    Partners: Male    Comment: 1st intercourse- 84, partners- 4, married- 16 yrs   Lifestyle  . Physical activity:    Days per week: Not on file    Minutes per session: Not on file  . Stress: Not on file  Relationships  . Social connections:    Talks on phone: Not on file    Gets together: Not on file    Attends religious service: Not on file    Active member of club or organization: Not on file    Attends meetings of clubs or organizations: Not on file    Relationship status: Not on file  . Intimate partner violence:    Fear of current or ex partner: Not on file    Emotionally abused: Not on file    Physically abused: Not on file    Forced sexual activity: Not on file  Other Topics Concern  . Not on file  Social History Narrative   Original from [place]    Lives w/ husband      Allergies as of 09/07/2018   No  Known Allergies     Medication List       Accurate as of Sep 07, 2018  3:34 PM. If you have any questions, ask your nurse or doctor.        STOP taking these medications   omeprazole 40 MG capsule Commonly known as:  PRILOSEC Stopped by:  Willow OraJose Paz, MD     TAKE these medications   amoxicillin 500 MG capsule Commonly known as:  AMOXIL Take 2 capsules (1,000 mg total) by mouth 2 (two) times daily.   CALCIUM 1200 PO Take by mouth.   clarithromycin 500 MG tablet Commonly known as:  BIAXIN Take 1 tablet (500 mg total) by mouth 2 (two) times daily.   Estradiol 10 MCG Tabs vaginal tablet Place 1 tablet (10 mcg total) vaginally 2 (two) times a week.   hydroxychloroquine 200 MG tablet Commonly known as:  PLAQUENIL TAKE 1 TABLET EVERY DAY   pilocarpine 5 MG tablet Commonly known as:  SALAGEN Take 1  tablet (5 mg total) by mouth 3 (three) times daily as needed.   Selenium 200 MCG Caps Take 200 mcg by mouth daily.   vitamin C 100 MG tablet Take 100 mg by mouth daily.   Vitamin D3 125 MCG (5000 UT) Caps Take 1 capsule by mouth 2 (two) times a week.           Objective:   Physical Exam There were no vitals taken for this visit. This is virtual video visit, alert oriented x3, no apparent distress    Assessment     Assessment ENDO:    Dr Elvera LennoxGherghe --Luiz BlareGraves' disease dx 2016, ophthalmopathy dx ~ 04-2015 , had diplopia, s/p orbital decompression surgery ~ 05-2015   --h/o hyperprolactinemia  RHEUM:  Dr Victory Dakinavenshwar  Sjogren syndrome  Dx 2016  --  + ANA Menopausal   PLAN: H. pylori infection: After last visit, H.P. testing came back positive, she took amoxicillin, Biaxin and PPIs.  Symptoms essentially resolved. Plan:  Recheck H. pylori testing. Refer to GI if we have been unable to eradicate the infection or if symptoms resurface.  Patient in agreement. COVID-19: The patient has an autoimmune disease, encouraged to continue taking precautions. RTC already scheduled for 11-2018 CPX    I discussed the assessment and treatment plan with the patient. The patient was provided an opportunity to ask questions and all were answered. The patient agreed with the plan and demonstrated an understanding of the instructions.   The patient was advised to call back or seek an in-person evaluation if the symptoms worsen or if the condition fails to improve as anticipated.

## 2018-09-08 DIAGNOSIS — A048 Other specified bacterial intestinal infections: Secondary | ICD-10-CM | POA: Insufficient documentation

## 2018-09-08 NOTE — Assessment & Plan Note (Signed)
H. pylori infection: After last visit, H.P. testing came back positive, she took amoxicillin, Biaxin and PPIs.  Symptoms essentially resolved. Plan:  Recheck H. pylori testing. Refer to GI if we have been unable to eradicate the infection or if symptoms resurface.  Patient in agreement. COVID-19: The patient has an autoimmune disease, encouraged to continue taking precautions. RTC already scheduled for 11-2018 CPX

## 2018-09-12 ENCOUNTER — Other Ambulatory Visit: Payer: Self-pay

## 2018-09-12 ENCOUNTER — Other Ambulatory Visit: Payer: 59

## 2018-09-12 DIAGNOSIS — A048 Other specified bacterial intestinal infections: Secondary | ICD-10-CM

## 2018-09-13 LAB — H. PYLORI BREATH TEST: H. pylori Breath Test: NOT DETECTED

## 2018-09-14 ENCOUNTER — Other Ambulatory Visit: Payer: Self-pay

## 2018-09-14 ENCOUNTER — Ambulatory Visit (INDEPENDENT_AMBULATORY_CARE_PROVIDER_SITE_OTHER): Payer: 59 | Admitting: Internal Medicine

## 2018-09-14 DIAGNOSIS — Z20828 Contact with and (suspected) exposure to other viral communicable diseases: Secondary | ICD-10-CM | POA: Diagnosis not present

## 2018-09-14 DIAGNOSIS — A048 Other specified bacterial intestinal infections: Secondary | ICD-10-CM

## 2018-09-14 DIAGNOSIS — Z20822 Contact with and (suspected) exposure to covid-19: Secondary | ICD-10-CM

## 2018-09-14 NOTE — Progress Notes (Signed)
Subjective:    Patient ID: Cynthia Fox, female    DOB: 08/12/1970, 48 y.o.   MRN: [id number]  DOS:  09/14/2018 Type of visit - description: Virtual Visit via Video Note  I connected with@ on 09/14/18 at  3:20 PM EDT by a video enabled telemedicine application and verified that I am speaking with the correct person using two identifiers.   THIS ENCOUNTER IS A VIRTUAL VISIT DUE TO COVID-19 - PATIENT WAS NOT SEEN IN THE OFFICE. PATIENT HAS CONSENTED TO VIRTUAL VISIT / TELEMEDICINE VISIT   Location of patient: home  Location of provider: office  I discussed the limitations of evaluation and management by telemedicine and the availability of in person appointments. The patient expressed understanding and agreed to proceed.  History of Present Illness: Acute  Concern about COVID-19 exposure About 12 days ago she went to visit a friend, neither 1 had a mask, she was closer than 6 feet. That day, her friend was asymptomatic but later on she was diagnosed with COVID-19.  H/o H. pylori: Asymptomatic     Review of Systems Denies fever chills No chest pain no difficulty breathing No anosmia No nausea, vomiting, diarrhea No cough  Past Medical History:  Diagnosis Date  . Compressive optic neuropathy   . Hepatitis A   . Seasonal allergies   . Sjogren's disease (HCC)    positive ANA nad Ro  . Thyroid eye disease     Past Surgical History:  Procedure Laterality Date  . EYE SURGERY Bilateral 2017, 2018   release of optic pressure   . FUNCTIONAL ENDOSCOPIC SINUS SURGERY Bilateral 05/2015  . Orbital Decompression Bilateral 05/2015    Social History   Socioeconomic History  . Marital status: Married    Spouse name: Not on file  . Number of children: 0  . Years of education: Not on file  . Highest education level: Not on file  Occupational History  . 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 Financial resource strain: Not on file  . Food insecurity:    Worry: Not on  file    Inability: Not on file  . Transportation needs:    Medical: Not on file    Non-medical: Not on file  Tobacco Use  . Smoking status: Never Smoker  . Smokeless tobacco: Never Used  Substance and Sexual Activity  . Alcohol use: Yes    Alcohol/week: 0.0 standard drinks    Comment: weekends  . Drug use: No  . Sexual activity: Yes    Partners: Male    Comment: 1st intercourse- [id number], partners- 4, married- 16 yrs   Lifestyle  . Physical activity:    Days per week: Not on file    Minutes per session: Not on file  . Stress: Not on file  Relationships  . Social connections:    Talks on phone: Not on file    Gets together: Not on file    Attends religious service: Not on file    Active member of club or organization: Not on file    Attends meetings of clubs or organizations: Not on file    Relationship status: Not on file  . Intimate partner violence:    Fear of current or ex partner: Not on file    Emotionally abused: Not on file    Physically abused: Not on file    Forced sexual activity: Not on file  Other Topics Concern  . Not on file  Social History Narrative  Original from [place]    Lives w/ husband      Allergies as of 09/14/2018   No Known Allergies     Medication List       Accurate as of September 14, 2018  3:43 PM. If you have any questions, ask your nurse or doctor.        amoxicillin 500 MG capsule Commonly known as:  AMOXIL Take 2 capsules (1,000 mg total) by mouth 2 (two) times daily.   CALCIUM 1200 PO Take by mouth.   clarithromycin 500 MG tablet Commonly known as:  BIAXIN Take 1 tablet (500 mg total) by mouth 2 (two) times daily.   Estradiol 10 MCG Tabs vaginal tablet Place 1 tablet (10 mcg total) vaginally 2 (two) times a week.   hydroxychloroquine 200 MG tablet Commonly known as:  PLAQUENIL TAKE 1 TABLET EVERY DAY   pilocarpine 5 MG tablet Commonly known as:  SALAGEN Take 1 tablet (5 mg total) by mouth 3 (three) times daily as needed.    Selenium 200 MCG Caps Take 200 mcg by mouth daily.   vitamin C 100 MG tablet Take 100 mg by mouth daily.   Vitamin D3 125 MCG (5000 UT) Caps Take 1 capsule by mouth 2 (two) times a week.           Objective:   Physical Exam There were no vitals taken for this visit. This is a virtual video visit.  Patient is alert oriented x3, no apparent distress     Assessment     Assessment ENDO:    Dr Elvera Lennox --Luiz Blare' disease dx 2016, ophthalmopathy dx ~ 04-2015 , had diplopia, s/p orbital decompression surgery ~ 05-2015   --h/o hyperprolactinemia  RHEUM:  Dr Victory Dakin  Sjogren syndrome  Dx 2016  --  + ANA Menopausal   PLAN: COVID-19 exposure: 12 days ago, patient remains asymptomatic.  Recommend to adhere to strict precautions including social distancing and consistent use of a mask. She will call anytime if she has symptoms such as fever, chills, runny nose, cough, chest pain, difficulty breathing.  She verbalized understanding. H. pylori infection: Resolved. RTC already scheduled for 11-2018 CPX    I discussed the assessment and treatment plan with the patient. The patient was provided an opportunity to ask questions and all were answered. The patient agreed with the plan and demonstrated an understanding of the instructions.   The patient was advised to call back or seek an in-person evaluation if the symptoms worsen or if the condition fails to improve as anticipated.

## 2018-09-16 NOTE — Assessment & Plan Note (Signed)
COVID-19 exposure: 12 days ago, patient remains asymptomatic.  Recommend to adhere to strict precautions including social distancing and consistent use of a mask. She will call anytime if she has symptoms such as fever, chills, runny nose, cough, chest pain, difficulty breathing.  She verbalized understanding. H. pylori infection: Resolved. RTC already scheduled for 11-2018 CPX

## 2018-09-26 NOTE — Progress Notes (Deleted)
Office Visit Note  Patient: Cynthia Fox             Date of Birth: Feb 17, 1971           MRN: [id number]             PCP: Colon Branch, MD Referring: Colon Branch, MD Visit Date: 10/09/2018 Occupation: @GUAROCC @  Subjective:  No chief complaint on file.   Plaquenil 200 mg 1 tablet daily.  Last Plaquenil eye exam normal on 08/29/2018.  Most recent CBC/CMP stable on 03/06/2018.  She is overdue for labs.  CBC/CMP ordered for today and will monitor every 5 months.  Standing orders placed. Recommend annual influenza, Pneumovax 23, Prevnar 13, and Shingrix as indicated for immunosuppressant therapy.    History of Present Illness: Cynthia Fox is a 47 y.o. female ***   Activities of Daily Living:  Patient reports morning stiffness for *** {minute/hour:19697}.   Patient {ACTIONS;DENIES/REPORTS:21021675::"Denies"} nocturnal pain.  Difficulty dressing/grooming: {ACTIONS;DENIES/REPORTS:21021675::"Denies"} Difficulty climbing stairs: {ACTIONS;DENIES/REPORTS:21021675::"Denies"} Difficulty getting out of chair: {ACTIONS;DENIES/REPORTS:21021675::"Denies"} Difficulty using hands for taps, buttons, cutlery, and/or writing: {ACTIONS;DENIES/REPORTS:21021675::"Denies"}  No Rheumatology ROS completed.   PMFS History:  Patient Active Problem List   Diagnosis Date Noted  . H. pylori infection, treated 06/2018 09/08/2018  . 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 Annual physical exam 10/24/2016  . Menopause 10/13/2016  . H/O seasonal allergies 03/23/2016  . Vitamin D deficiency 03/23/2016  . High risk medication use 03/22/2016  . Sjogren's syndrome ([place]) 06/25/2015  . PCP NOTES >>>>>>>>>>>>>>>>>>>>>> 06/25/2015  . Graves disease 06/01/2015  . Hyperprolactinemia ([place]) 01/12/2015  . Elevated antinuclear antibody (ANA) level 07/23/2014    Past Medical History:  Diagnosis Date  . Compressive optic neuropathy   . Hepatitis A   . Seasonal allergies   . Sjogren's disease ([place])    positive ANA nad Ro   . Thyroid eye disease     Family History  Problem Relation Age of Onset  . Hypertension Mother   . Retinal detachment Mother   . Thyroid disease Mother   . Diabetes Father   . Thyroid disease Sister   . Heart attack Maternal Grandfather   . Breast cancer Neg Hx   . Colon cancer Neg Hx    Past Surgical History:  Procedure Laterality Date  . EYE SURGERY Bilateral 2017, 2018   release of optic pressure   . FUNCTIONAL ENDOSCOPIC SINUS SURGERY Bilateral 05/2015  . Orbital Decompression Bilateral 05/2015   Social History   Social History Narrative   Original from [employer]    Lives w/ husband   Immunization History  Administered Date(s) Administered  . Td 04/11/2012     Objective: Vital Signs: There were no vitals taken for this visit.   Physical Exam   Musculoskeletal Exam: ***  CDAI Exam: CDAI Score: - Patient Global: -; Provider Global: - Swollen: -; Tender: - Joint Exam   No joint exam has been documented for this visit   There is currently no information documented on the homunculus. Go to the Rheumatology activity and complete the homunculus joint exam.  Investigation: No additional findings.  Imaging: No results found.  Recent Labs: Lab Results  Component Value Date   WBC 5.8 03/06/2018   HGB 12.7 03/06/2018   PLT 271 03/06/2018   NA 141 03/06/2018   K 4.0 03/06/2018   CL 106 03/06/2018   CO2 30 03/06/2018   GLUCOSE 94 03/06/2018   BUN 11 03/06/2018   CREATININE 0.75 03/06/2018  BILITOT 0.7 03/06/2018   ALKPHOS 104 11/21/2016   AST 23 03/06/2018   ALT 21 03/06/2018   PROT 6.9 03/06/2018   PROT 7.1 03/06/2018   ALBUMIN 4.3 11/21/2016   CALCIUM 8.9 03/06/2018   GFRAA 108 03/06/2018    Speciality Comments: PLQ Eye Exam: 08/29/18 @ [facility] Follow up in 1 year  Procedures:  No procedures performed Allergies: Patient has no known allergies.   Assessment / Plan:     Visit Diagnoses: No diagnosis found.   Orders: No orders of the  defined types were placed in this encounter.  No orders of the defined types were placed in this encounter.   Face-to-face time spent with patient was *** minutes. Greater than 50% of time was spent in counseling and coordination of care.  Follow-Up Instructions: No follow-ups on file.   Gearldine Bienenstockaylor M Orlando Devereux, PA-C  Note - This record has been created using Dragon software.  Chart creation errors have been sought, but may not always  have been located. Such creation errors do not reflect on  the standard of medical care.

## 2018-10-09 ENCOUNTER — Ambulatory Visit: Payer: Self-pay | Admitting: Rheumatology

## 2018-11-01 ENCOUNTER — Other Ambulatory Visit: Payer: Self-pay | Admitting: Physician Assistant

## 2018-11-01 NOTE — Telephone Encounter (Signed)
Last Visit: 03/07/19 Next Visit: 12/04/18  Okay to refill per Dr. Estanislado Pandy

## 2018-11-14 ENCOUNTER — Encounter: Payer: 59 | Admitting: Internal Medicine

## 2018-11-16 ENCOUNTER — Encounter: Payer: 59 | Admitting: Internal Medicine

## 2018-11-20 NOTE — Progress Notes (Signed)
Office Visit Note  Patient: Cynthia Fox             Date of Birth: 12/18/1970           MRN: [id number]             PCP: Wanda PlumpPaz, Jose E, MD Referring: Wanda PlumpPaz, Jose E, MD Visit Date: 12/04/2018 Occupation: @GUAROCC @  Subjective:  Sicca symptoms   History of Present Illness: Cynthia Fox is a 48 y.o. female with history of Sjogren's and Raynaud's.  She is taking Plaquenil 200 mg 1 tablet by mouth twice daily.  She is taking pilocarpine 5 mg BID PRN for sicca symptoms.  She has not noticed much improvement in her sicca symptoms and starting on Plaquenil and pilocarpine.  She states that she has tried taking pilocarpine 3 times daily but cannot tolerate the increased sweating.  She states that she has been using Restasis twice daily but continues to have eye dryness and eye redness.  She follows up closely with her ophthalmologist.  She has tried using Biotene products over-the-counter but has not noticed much improvement.  She denies any joint pain or joint swelling at this time.  She denies any symptoms of Raynaud's currently.  She states that her symptoms are worse in the winter.  She states that she takes Norvasc in the wintertime but does not require it during the summer.  She denies any digital ulcerations or signs of gangrene.  She denies any rashes or skin tightness.    Activities of Daily Living:  Patient reports morning stiffness for 0 minutes.   Patient Denies nocturnal pain.  Difficulty dressing/grooming: Denies Difficulty climbing stairs: Denies Difficulty getting out of chair: Denies Difficulty using hands for taps, buttons, cutlery, and/or writing: Denies  Review of Systems  Constitutional: Negative for fatigue.  HENT: Positive for mouth dryness and nose dryness. Negative for mouth sores.   Eyes: Positive for dryness. Negative for pain, itching and visual disturbance.  Respiratory: Negative for cough, hemoptysis, shortness of breath, wheezing and difficulty breathing.    Cardiovascular: Negative for chest pain, palpitations, hypertension and swelling in legs/feet.  Gastrointestinal: Negative for blood in stool, constipation and diarrhea.  Endocrine: Negative for increased urination.  Genitourinary: Negative for difficulty urinating and painful urination.  Musculoskeletal: Negative for arthralgias, joint pain, joint swelling, myalgias, muscle weakness, morning stiffness, muscle tenderness and myalgias.  Skin: Negative for color change, pallor, rash, hair loss, nodules/bumps, redness, skin tightness, ulcers and sensitivity to sunlight.  Allergic/Immunologic: Negative for susceptible to infections.  Neurological: Negative for dizziness, light-headedness, numbness, headaches, memory loss and weakness.  Hematological: Negative for bruising/bleeding tendency and swollen glands.  Psychiatric/Behavioral: Negative for depressed mood, confusion and sleep disturbance. The patient is not nervous/anxious.     PMFS History:  Patient Active Problem List   Diagnosis Date Noted  . H. pylori infection, treated 06/2018 09/08/2018  . 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 Annual physical exam 10/24/2016  . Menopause 10/13/2016  . H/O seasonal allergies 03/23/2016  . Vitamin D deficiency 03/23/2016  . High risk medication use 03/22/2016  . Sjogren's syndrome (HCC) 06/25/2015  . PCP NOTES >>>>>>>>>>>>>>>>>>>>>> 06/25/2015  . Graves disease 06/01/2015  . Hyperprolactinemia (HCC) 01/12/2015  . Elevated antinuclear antibody (ANA) level 07/23/2014    Past Medical History:  Diagnosis Date  . Compressive optic neuropathy   . Hepatitis A   . Seasonal allergies   . Sjogren's disease (HCC)    positive ANA nad Ro  . Thyroid eye disease  Family History  Problem Relation Age of Onset  . Hypertension Mother   . Retinal detachment Mother   . Thyroid disease Mother   . Diabetes Father   . Thyroid disease Sister   . Heart attack Maternal Grandfather   . Breast cancer Neg Hx    . Colon cancer Neg Hx    Past Surgical History:  Procedure Laterality Date  . EYE SURGERY Bilateral 2017, 2018   release of optic pressure   . FUNCTIONAL ENDOSCOPIC SINUS SURGERY Bilateral 05/2015  . Orbital Decompression Bilateral 05/2015   Social History   Social History Narrative   Original from [employer]    Lives w/ husband   Immunization History  Administered Date(s) Administered  . Td 04/11/2012     Objective: Vital Signs: BP 121/72 (BP Location: Left Arm, Patient Position: Sitting, Cuff Size: Normal)   Pulse 81   Resp 12   Ht 5' (1.524 m)   Wt 150 lb 6.4 oz (68.2 kg)   BMI 29.37 kg/m    Physical Exam Vitals signs and nursing note reviewed.  Constitutional:      Appearance: She is well-developed.  HENT:     Head: Normocephalic and atraumatic.  Eyes:     Conjunctiva/sclera: Conjunctivae normal.  Neck:     Musculoskeletal: Normal range of motion.  Cardiovascular:     Rate and Rhythm: Normal rate and regular rhythm.     Heart sounds: Normal heart sounds.  Pulmonary:     Effort: Pulmonary effort is normal.     Breath sounds: Normal breath sounds.  Abdominal:     General: Bowel sounds are normal.     Palpations: Abdomen is soft.  Lymphadenopathy:     Cervical: No cervical adenopathy.  Skin:    General: Skin is warm and dry.     Capillary Refill: Capillary refill takes less than 2 seconds.  Neurological:     Mental Status: She is alert and oriented to person, place, and time.  Psychiatric:        Behavior: Behavior normal.      Musculoskeletal Exam: C-spine, thoracic spine, and lumbar spine good ROM.  No midline spinal tenderness.  No SI joint tenderness.  Shoulder joints, elbow joints, wrist joints, MCPs, PIPs, and DIPs good ROM with no synovitis.  Complete fist formation bilaterally.  Hip joints, knee joints, ankle joints, MTPs, PIPs, and DIPs good ROM with no synovitis.  No warmth or effusion of knee joints.  No tenderness or swelling of ankle joints.     CDAI Exam: CDAI Score: - Patient Global: -; Provider Global: - Swollen: -; Tender: - Joint Exam   No joint exam has been documented for this visit   There is currently no information documented on the homunculus. Go to the Rheumatology activity and complete the homunculus joint exam.  Investigation: No additional findings.  Imaging: No results found.  Recent Labs: Lab Results  Component Value Date   WBC 5.8 03/06/2018   HGB 12.7 03/06/2018   PLT 271 03/06/2018   NA 141 03/06/2018   K 4.0 03/06/2018   CL 106 03/06/2018   CO2 30 03/06/2018   GLUCOSE 94 03/06/2018   BUN 11 03/06/2018   CREATININE 0.75 03/06/2018   BILITOT 0.7 03/06/2018   ALKPHOS 104 11/21/2016   AST 23 03/06/2018   ALT 21 03/06/2018   PROT 6.9 03/06/2018   PROT 7.1 03/06/2018   ALBUMIN 4.3 11/21/2016   CALCIUM 8.9 03/06/2018   GFRAA 108 03/06/2018  Speciality Comments: PLQ Eye Exam: 08/29/18 @ [facility] Follow up in 1 year  Procedures:  No procedures performed Allergies: Patient has no known allergies.   Assessment / Plan:     Visit Diagnoses: Sjogren's syndrome with keratoconjunctivitis sicca (HCC) -  Positive ANA, positive Ro, positive rheumatoid factor, negative CCP: She has chronic sicca symptoms.  She takes Plaquenil 200 mg 1 tablet by mouth twice daily and pilocarpine 5 mg twice daily.  She tried taking pilocarpine 3 times daily but could not tolerate the increased sweating.  She uses Restasis eyedrops twice daily but continues to have eye dryness and eye redness.  She follows up with her ophthalmologist on a regular basis.  She has tried Biotene products but has not noticed much improvement in mouth dryness.  If she misses a dose of pilocarpine she experiences worsening symptoms.  CBC some CMP, UA will be checked today.  She will continue on the current treatment regimen.  She does not need any refills at this time.  She will follow-up in the office in 5 months.  High risk medication use -  Plaquenil 200 mg 1 tablet by mouth twice daily.  CBC and CMP were drawn today to monitor for drug toxicity.  PLQ Eye Exam: 08/29/18 @ [facility] Follow up in 1 year- Plan: CBC with Differential/Platelet, COMPLETE METABOLIC PANEL WITH GFR  Raynaud's disease without gangrene: She has not had any recent symptoms of Raynaud's.  Her symptoms are less frequent and severe in the summertime.  She has no digital ulcerations or signs of gangrene.  She is taking plaquenil 200 mg 1 tablet by mouth twice daily.  In the wintertime she takes norvasc, which improves her symptoms.  She was advised to notify us if she develops new or worsening symptoms.  She will also notify us if she has any digital ulcerations or signs of gangrene.  She was encouraged to keep her core body temperature warm and wear gloves and thick socks.  We also discussed drinking warm liquids.  Chronic midline low back pain without sciatica: She has no lower back pain at this time.  She has no symptoms of sciatica.   Primary insomnia: She has been sleeping well at night.   Other medical conditions are listed as follows:   History of Graves' disease  History of vitamin D deficiency  Orders: No orders of the defined types were placed in this encounter.  No orders of the defined types were placed in this encounter.     Follow-Up Instructions: Return in about 5 months (around 05/06/2019) for Sjogren's syndrome, Raynaud's syndrome.   Ofilia Neas, PA-C   I examined and evaluated the patient with Hazel Sams PA.  Patient continues to have sicca symptoms despite using Plaquenil, pilocarpine and Restasis.  Over-the-counter products were discussed at length.  She will to continue on the current regimen.  The plan of care was discussed as noted above.  Bo Merino, MD  Note - This record has been created using Editor, commissioning.  Chart creation errors have been sought, but may not always  have been located. Such creation errors do  not reflect on  the standard of medical care.

## 2018-11-28 ENCOUNTER — Encounter: Payer: 59 | Admitting: Internal Medicine

## 2018-12-04 ENCOUNTER — Encounter: Payer: Self-pay | Admitting: Rheumatology

## 2018-12-04 ENCOUNTER — Other Ambulatory Visit: Payer: Self-pay

## 2018-12-04 ENCOUNTER — Ambulatory Visit: Payer: 59 | Admitting: Rheumatology

## 2018-12-04 VITALS — BP 121/72 | HR 81 | Resp 12 | Ht 60.0 in | Wt 150.4 lb

## 2018-12-04 DIAGNOSIS — Z79899 Other long term (current) drug therapy: Secondary | ICD-10-CM

## 2018-12-04 DIAGNOSIS — M3501 Sicca syndrome with keratoconjunctivitis: Secondary | ICD-10-CM

## 2018-12-04 DIAGNOSIS — I7300 Raynaud's syndrome without gangrene: Secondary | ICD-10-CM

## 2018-12-04 DIAGNOSIS — M545 Low back pain, unspecified: Secondary | ICD-10-CM

## 2018-12-04 DIAGNOSIS — G8929 Other chronic pain: Secondary | ICD-10-CM

## 2018-12-04 DIAGNOSIS — Z8639 Personal history of other endocrine, nutritional and metabolic disease: Secondary | ICD-10-CM

## 2018-12-04 DIAGNOSIS — F5101 Primary insomnia: Secondary | ICD-10-CM

## 2018-12-05 ENCOUNTER — Other Ambulatory Visit: Payer: Self-pay

## 2018-12-05 ENCOUNTER — Other Ambulatory Visit (INDEPENDENT_AMBULATORY_CARE_PROVIDER_SITE_OTHER): Payer: 59

## 2018-12-05 DIAGNOSIS — E0500 Thyrotoxicosis with diffuse goiter without thyrotoxic crisis or storm: Secondary | ICD-10-CM

## 2018-12-05 DIAGNOSIS — E221 Hyperprolactinemia: Secondary | ICD-10-CM

## 2018-12-05 LAB — COMPLETE METABOLIC PANEL WITH GFR
AG Ratio: 1.4 (calc) (ref 1.0–2.5)
ALT: 16 U/L (ref 6–29)
AST: 21 U/L (ref 10–35)
Albumin: 4.5 g/dL (ref 3.6–5.1)
Alkaline phosphatase (APISO): 97 U/L (ref 37–153)
BUN: 13 mg/dL (ref 7–25)
CO2: 27 mmol/L (ref 20–32)
Calcium: 9.3 mg/dL (ref 8.6–10.4)
Chloride: 101 mmol/L (ref 98–110)
Creat: 0.77 mg/dL (ref 0.50–1.05)
GFR, Est African American: 104 mL/min/{1.73_m2} (ref >=60)
GFR, Est Non African American: 90 mL/min/{1.73_m2} (ref >=60)
Globulin: 3.2 g/dL (calc) (ref 1.9–3.7)
Glucose, Bld: 93 mg/dL (ref 65–99)
Potassium: 4.1 mmol/L (ref 3.5–5.3)
Sodium: 138 mmol/L (ref 135–146)
Total Bilirubin: 0.8 mg/dL (ref 0.2–1.2)
Total Protein: 7.7 g/dL (ref 6.1–8.1)

## 2018-12-05 LAB — CBC WITH DIFFERENTIAL/PLATELET
Absolute Monocytes: 519 cells/uL (ref 200–950)
Basophils Absolute: 47 cells/uL (ref 0–200)
Basophils Relative: 0.8 %
Eosinophils Absolute: 301 cells/uL (ref 15–500)
Eosinophils Relative: 5.1 %
HCT: 40.4 % (ref 35.0–45.0)
Hemoglobin: 13.6 g/dL (ref 11.7–15.5)
Lymphs Abs: 2154 cells/uL (ref 850–3900)
MCH: 34.3 pg — ABNORMAL HIGH (ref 27.0–33.0)
MCHC: 33.7 g/dL (ref 32.0–36.0)
MCV: 101.8 fL — ABNORMAL HIGH (ref 80.0–100.0)
MPV: 11.4 fL (ref 7.5–12.5)
Monocytes Relative: 8.8 %
Neutro Abs: 2879 cells/uL (ref 1500–7800)
Neutrophils Relative %: 48.8 %
Platelets: 266 10*3/uL (ref 140–400)
RBC: 3.97 10*6/uL (ref 3.80–5.10)
RDW: 12.9 % (ref 11.0–15.0)
Total Lymphocyte: 36.5 %
WBC: 5.9 10*3/uL (ref 3.8–10.8)

## 2018-12-05 LAB — URINALYSIS, ROUTINE W REFLEX MICROSCOPIC
Bacteria, UA: NONE SEEN /HPF
Bilirubin Urine: NEGATIVE
Glucose, UA: NEGATIVE
Hgb urine dipstick: NEGATIVE
Hyaline Cast: NONE SEEN /LPF
Ketones, ur: NEGATIVE
Nitrite: NEGATIVE
Protein, ur: NEGATIVE
RBC / HPF: NONE SEEN /HPF (ref 0–2)
Specific Gravity, Urine: 1.011 (ref 1.001–1.03)
Squamous Epithelial / HPF: NONE SEEN /HPF (ref <=5)
WBC, UA: NONE SEEN /HPF (ref 0–5)
pH: 8 (ref 5.0–8.0)

## 2018-12-05 NOTE — Progress Notes (Signed)
MCV and MCH are elevated.  Rest of CBC WNL.  CMP WNL.   UA revealed trace leukocytes.  Please notify patient and advise patient to follow up with PCP if she has any signs or symptoms of an infection.

## 2018-12-06 LAB — TSH: TSH: 1.57 u[IU]/mL (ref 0.35–4.50)

## 2018-12-06 LAB — T3, FREE: T3, Free: 2.9 pg/mL (ref 2.3–4.2)

## 2018-12-06 LAB — T4, FREE: Free T4: 1.18 ng/dL (ref 0.60–1.60)

## 2018-12-10 LAB — PROLACTIN: Prolactin: 12.9 ng/mL

## 2018-12-10 LAB — THYROID STIMULATING IMMUNOGLOBULIN: TSI: <89 % baseline (ref <140)

## 2018-12-24 ENCOUNTER — Other Ambulatory Visit: Payer: Self-pay

## 2018-12-24 ENCOUNTER — Ambulatory Visit (INDEPENDENT_AMBULATORY_CARE_PROVIDER_SITE_OTHER): Payer: PRIVATE HEALTH INSURANCE | Admitting: Internal Medicine

## 2018-12-24 ENCOUNTER — Encounter: Payer: Self-pay | Admitting: Internal Medicine

## 2018-12-24 VITALS — BP 119/69 | HR 93 | Temp 98.2°F | Resp 16 | Ht 60.0 in | Wt 150.2 lb

## 2018-12-24 DIAGNOSIS — Z23 Encounter for immunization: Secondary | ICD-10-CM | POA: Diagnosis not present

## 2018-12-24 DIAGNOSIS — Z0000 Encounter for general adult medical examination without abnormal findings: Secondary | ICD-10-CM | POA: Diagnosis not present

## 2018-12-24 DIAGNOSIS — M3501 Sicca syndrome with keratoconjunctivitis: Secondary | ICD-10-CM | POA: Diagnosis not present

## 2018-12-24 NOTE — Progress Notes (Signed)
Subjective:    Patient ID: Cynthia Fox, female    DOB: May 28, 1970, 48 y.o.   MRN: [id number]  DOS:  12/24/2018 Type of visit - description: CPX In general feeling well. There was mild evidence of UTI on a recent urine test ordered by rheumatology, the patient was asymptomatic. Specifically denies dysuria, gross hematuria, vaginal discharge or vaginal bleeding. She has a desk job, reports occasionally upper back pain without neck pain or paresthesias.    Review of Systems   Other than above, a 14 point review of systems is negative    Past Medical History:  Diagnosis Date  . Compressive optic neuropathy   . Hepatitis A   . Seasonal allergies   . Sjogren's disease ([place])    positive ANA nad Ro  . Thyroid eye disease     Past Surgical History:  Procedure Laterality Date  . EYE SURGERY Bilateral 2017, 2018   release of optic pressure   . FUNCTIONAL ENDOSCOPIC SINUS SURGERY Bilateral 05/2015  . Orbital Decompression Bilateral 05/2015    Social History   Socioeconomic History  . Marital status: Married    Spouse name: Not on file  . Number of children: 0  . Years of education: Not on file  . Highest education level: Not on file  Occupational History  . Occupation: Tourist information centre manager   Social Needs  . Financial resource strain: Not on file  . Food insecurity    Worry: Not on file    Inability: Not on file  . Transportation needs    Medical: Not on file    Non-medical: Not on file  Tobacco Use  . Smoking status: Never Smoker  . Smokeless tobacco: Never Used  Substance and Sexual Activity  . Alcohol use: Yes    Alcohol/week: 0.0 standard drinks    Comment: weekends  . Drug use: No  . Sexual activity: Yes    Partners: Male    Comment: 1st intercourse- 8, partners- 41, married- 40 yrs   Lifestyle  . Physical activity    Days per week: Not on file    Minutes per session: Not on file  . Stress: Not on file  Relationships  . Social Herbalist on phone:  Not on file    Gets together: Not on file    Attends religious service: Not on file    Active member of club or organization: Not on file    Attends meetings of clubs or organizations: Not on file    Relationship status: Not on file  . Intimate partner violence    Fear of current or ex partner: Not on file    Emotionally abused: Not on file    Physically abused: Not on file    Forced sexual activity: Not on file  Other Topics Concern  . Not on file  Social History Narrative   Original from [place]    Lives w/ husband   Family History  Problem Relation Age of Onset  . Hypertension Mother   . Retinal detachment Mother   . Thyroid disease Mother   . Diabetes Father   . Thyroid disease Sister   . Heart attack Maternal Grandfather   . Breast cancer Neg Hx   . Colon cancer Neg Hx        Allergies as of 12/24/2018   No Known Allergies     Medication List       Accurate as of December 24, 2018 11:59 PM. If  you have any questions, ask your nurse or doctor.        CALCIUM 1200 PO Take by mouth.   Estradiol 10 MCG Tabs vaginal tablet Place 1 tablet (10 mcg total) vaginally 2 (two) times a week.   hydroxychloroquine 200 MG tablet Commonly known as: PLAQUENIL TAKE 1 TABLET EVERY DAY   pilocarpine 5 MG tablet Commonly known as: SALAGEN TAKE 1 TABLET(5 MG) BY MOUTH THREE TIMES DAILY AS NEEDED   Selenium 200 MCG Caps Take 200 mcg by mouth daily.   vitamin C 100 MG tablet Take 100 mg by mouth daily.   Vitamin D3 125 MCG (5000 UT) Caps Take 1 capsule by mouth 2 (two) times a week.           Objective:   Physical Exam BP 119/69 (BP Location: Left Arm, Patient Position: Sitting, Cuff Size: Small)   Pulse 93   Temp 98.2 F (36.8 C) (Temporal)   Resp 16   Ht 5' (1.524 m)   Wt 150 lb 4 oz (68.2 kg)   SpO2 100%   BMI 29.34 kg/m   General: Well developed, NAD, BMI noted Neck: No  thyromegaly  HEENT:  Normocephalic . Face symmetric, atraumatic Neck: No TTP,  range of motion normal Lungs:  CTA B Normal respiratory effort, no intercostal retractions, no accessory muscle use. Heart: RRR,  no murmur.  No pretibial edema bilaterally  Abdomen:  Not distended, soft, non-tender. No rebound or rigidity.   Skin: Exposed areas without rash. Not pale. Not jaundice Neurologic:  alert & oriented X3.  Speech normal, gait appropriate for age and unassisted Strength symmetric and appropriate for age.  Psych: Cognition and judgment appear intact.  Cooperative with normal attention span and concentration.  Behavior appropriate. No anxious or depressed appearing.     Assessment    Assessment ENDO:    Dr Elvera LennoxGherghe --Luiz BlareGraves' disease dx 2016, ophthalmopathy dx ~ 04-2015 , had diplopia, s/p orbital decompression surgery ~ 05-2015   --h/o hyperprolactinemia  RHEUM:  Dr Victory Dakinavenshwar  Sjogren syndrome  Dx 2016  --  + ANA Menopausal   PLAN: Here for CPX Sees rheumatology, endocrinology. She seems to be doing well. Upper back pain: Likely postural, we discussed PT but that would be hard for her to do due to time constraints and cost.  We agree on self physical therapy and stretching, [facility]. RTC 1 year

## 2018-12-24 NOTE — Assessment & Plan Note (Addendum)
-  Td 2014 per patient - flu shot : Today - female care per gyn: MMG 04/2017, PAP 03/2018 --CCS: Never had a colonoscopy;   3 options d/w pt, elected IFOB   -labs: reviewed, check a FLP, UA, urine culture (last urine dip showed trace infection) -Diet and exercise: Discussed

## 2018-12-24 NOTE — Patient Instructions (Signed)
GO TO THE LAB : Get the blood work     GO TO THE FRONT DESK Schedule your next appointment  For a physical in 1 year     Visit the [facility] website for information regards good posture and exercises for  back pain prevention

## 2018-12-24 NOTE — Progress Notes (Signed)
Pre visit review using our clinic review tool, if applicable. No additional management support is needed unless otherwise documented below in the visit note. 

## 2018-12-25 LAB — URINALYSIS, ROUTINE W REFLEX MICROSCOPIC
Bilirubin Urine: NEGATIVE
Ketones, ur: NEGATIVE
Leukocytes,Ua: NEGATIVE
Nitrite: NEGATIVE
RBC / HPF: NONE SEEN (ref 0–?)
Specific Gravity, Urine: 1.02 (ref 1.000–1.030)
Total Protein, Urine: NEGATIVE
Urine Glucose: NEGATIVE
Urobilinogen, UA: 0.2 (ref 0.0–1.0)
WBC, UA: NONE SEEN (ref 0–?)
pH: 5.5 (ref 5.0–8.0)

## 2018-12-25 LAB — LIPID PANEL
Cholesterol: 182 mg/dL (ref 0–200)
HDL: 68 mg/dL (ref >39.00)
LDL Cholesterol: 91 mg/dL (ref 0–99)
NonHDL: 113.69
Total CHOL/HDL Ratio: 3
Triglycerides: 114 mg/dL (ref 0.0–149.0)
VLDL: 22.8 mg/dL (ref 0.0–40.0)

## 2018-12-25 NOTE — Assessment & Plan Note (Signed)
Here for CPX Sees rheumatology, endocrinology. She seems to be doing well. Upper back pain: Likely postural, we discussed PT but that would be hard for her to do due to time constraints and cost.  We agree on self physical therapy and stretching, recommend to visit the [facility] website. RTC 1 year

## 2018-12-26 LAB — URINE CULTURE
MICRO NUMBER:: 877679
SPECIMEN QUALITY:: ADEQUATE

## 2019-01-18 LAB — HM MAMMOGRAPHY

## 2019-02-04 ENCOUNTER — Encounter: Payer: Self-pay | Admitting: Internal Medicine

## 2019-02-04 ENCOUNTER — Other Ambulatory Visit: Payer: Self-pay | Admitting: *Deleted

## 2019-02-04 DIAGNOSIS — M3501 Sicca syndrome with keratoconjunctivitis: Secondary | ICD-10-CM

## 2019-02-04 MED ORDER — HYDROXYCHLOROQUINE SULFATE 200 MG PO TABS: ORAL_TABLET | ORAL | 0 refills | Status: DC

## 2019-02-04 NOTE — Telephone Encounter (Signed)
Last Visit: 12/04/18 Next Visit: 06/04/19 Labs: 12/04/18 MCV and MCH are elevated. Rest of CBC WNL. CMP WNL.  Eye exam: 08/29/18 WNL  Okay to refill per Dr. Estanislado Pandy

## 2019-04-01 ENCOUNTER — Encounter: Payer: 59 | Admitting: Obstetrics & Gynecology

## 2019-04-11 ENCOUNTER — Other Ambulatory Visit: Payer: Self-pay

## 2019-04-15 ENCOUNTER — Encounter: Payer: 59 | Admitting: Obstetrics & Gynecology

## 2019-04-23 ENCOUNTER — Other Ambulatory Visit: Payer: Self-pay

## 2019-04-23 ENCOUNTER — Ambulatory Visit (INDEPENDENT_AMBULATORY_CARE_PROVIDER_SITE_OTHER): Payer: PRIVATE HEALTH INSURANCE | Admitting: Medical

## 2019-04-23 VITALS — Temp 97.0°F | Ht 60.0 in | Wt 152.0 lb

## 2019-04-23 DIAGNOSIS — Z20822 Contact with and (suspected) exposure to covid-19: Secondary | ICD-10-CM | POA: Diagnosis not present

## 2019-04-23 DIAGNOSIS — J029 Acute pharyngitis, unspecified: Secondary | ICD-10-CM | POA: Diagnosis not present

## 2019-04-23 DIAGNOSIS — R067 Sneezing: Secondary | ICD-10-CM | POA: Diagnosis not present

## 2019-04-23 DIAGNOSIS — R5383 Other fatigue: Secondary | ICD-10-CM

## 2019-04-23 MED ORDER — FLUTICASONE PROPIONATE 50 MCG/ACT NA SUSP: 2.0000 | Freq: Every day | NASAL | 1 refills | Status: DC

## 2019-04-23 NOTE — Patient Instructions (Signed)
You do describe some exposure to Covid with mild symptoms of fatigue, sore throat and sneezing.  Due to exposure we will go ahead and have you tested for Covid.  I gave you the number for [facility] test site.  Please call them tomorrow and get scheduled for testing.  During the interim for your symptoms I prescribed Flonase nasal spray.  If your signs symptoms change or worsen please let us know.  Stay at home/quarantine pending test results and until we discussed how you are doing clinically.  I went ahead and send note work note excuse through my chart.  Follow-up date to be determined pending Covid test results.

## 2019-04-23 NOTE — Progress Notes (Signed)
   Subjective:    Patient ID: Cynthia Fox, female    DOB: Apr 29, 1970, 49 y.o.   MRN: [id number]  HPI  Virtual Visit via Telephone Note  I connected with Cynthia Fox on 04/23/19 at  3:40 PM EST by telephone and verified that I am speaking with the correct person using two identifiers.  Location: Patient: home Provider: home.   I discussed the limitations, risks, security and privacy concerns of performing an evaluation and management service by telephone and the availability of in person appointments. I also discussed with the patient that there may be a patient responsible charge related to this service. The patient expressed understanding and agreed to proceed.   History of Present Illness: Pt states one contact of her tested + for covid. Other persons at her work have been sick. 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 Pt states coworker has been hospitalized with low 02 sat so suspicious for possible Covid  Pt states she only has mild fatigue, faint st and mild sneezing. Pt signs/symptoms only.  for one day.   No report of any fever, chills, sweats, headache, diarrhea or any loss of smell/taste.   Observations/Objective: General-no acute distress, pleasant and oriented.  Normal speech.    Assessment and Plan: You do describe some exposure to Covid with mild symptoms of fatigue, sore throat and sneezing.  Due to exposure we will go ahead and have you tested for Covid.  I gave you the number for [facility] test site.  Please call them tomorrow and get scheduled for testing.  During the interim for your symptoms I prescribed Flonase nasal spray.  If your signs symptoms change or worsen please let us know.  Stay at home/quarantine pending test results and until we discussed how you are doing clinically.  I went ahead and send note work note excuse through my chart.  Follow-up date to be determined pending Covid test results.  Follow Up Instructions:    I discussed  the assessment and treatment plan with the patient. The patient was provided an opportunity to ask questions and all were answered. The patient agreed with the plan and demonstrated an understanding of the instructions.   The patient was advised to call back or seek an in-person evaluation if the symptoms worsen or if the condition fails to improve as anticipated.  I provided 20 minutes of non-face-to-face time during this encounter.   Esperanza Richters, PA-C   Review of Systems     Objective:   Physical Exam        Assessment & Plan:

## 2019-04-25 ENCOUNTER — Ambulatory Visit: Payer: PRIVATE HEALTH INSURANCE | Attending: Internal Medicine

## 2019-04-25 DIAGNOSIS — Z20822 Contact with and (suspected) exposure to covid-19: Secondary | ICD-10-CM

## 2019-04-26 LAB — NOVEL CORONAVIRUS, NAA: SARS-CoV-2, NAA: DETECTED — AB

## 2019-04-29 ENCOUNTER — Telehealth: Payer: PRIVATE HEALTH INSURANCE | Admitting: Physician Assistant

## 2019-04-29 DIAGNOSIS — R059 Cough, unspecified: Secondary | ICD-10-CM

## 2019-04-29 DIAGNOSIS — U071 COVID-19: Secondary | ICD-10-CM

## 2019-04-29 DIAGNOSIS — R05 Cough: Secondary | ICD-10-CM

## 2019-04-29 MED ORDER — BENZONATATE 100 MG PO CAPS: 100.0000 mg | ORAL_CAPSULE | Freq: Three times a day (TID) | ORAL | 0 refills | Status: DC | PRN

## 2019-04-29 NOTE — Progress Notes (Signed)
Your test for COVID-19 was positive, meaning that you were infected with the novel coronavirus and could give the germ to others.    You have been enrolled in [facility] for COVID-19. Daily you will receive a questionnaire within the [place] website. Our COVID-19 response team will be monitoring your responses daily.  Please continue isolation at home, for at least 10 days since the start of your symptoms and until you have had 24 hours with no fever (without taking a fever reducer) and with improving of symptoms.  Please continue good preventive care measures, including:  frequent hand-washing, avoid touching your face, cover coughs/sneezes, stay out of crowds and keep a 6 foot distance from others.  Recheck or go to the nearest hospital ED tent for re-assessment if fever/cough/breathlessness return. he following symptoms may appear 2-14 days after exposure: . Fever . Cough . Shortness of breath or difficulty breathing . Chills . Repeated shaking with chills . Muscle pain . Headache . Sore throat . New loss of taste or smell . Fatigue . Congestion or runny nose . Nausea or vomiting . Diarrhea  Go to the nearest hospital ED for assessment if fever/cough/breathlessness are severe or illness seems like a threat to life.  It is vitally important that if you feel that you have an infection such as this virus or any other virus that you stay home and away from places where you may spread it to others.  You should avoid contact with people age 51 and older.   You can use medication such as A prescription cough medication called Tessalon Perles 100 mg. You may take 1-2 capsules every 8 hours as needed for cough  You may also take acetaminophen (Tylenol) as needed for fever.  Reduce your risk of any infection by using the same precautions used for avoiding the common cold or flu:  Marland Kitchen Wash your hands often with soap and warm water for at least 20 seconds.  If soap and water are not  readily available, use an alcohol-based hand sanitizer with at least 60% alcohol.  . If coughing or sneezing, cover your mouth and nose by coughing or sneezing into the elbow areas of your shirt or coat, into a tissue or into your sleeve (not your hands). . Avoid shaking hands with others and consider head nods or verbal greetings only. . Avoid touching your eyes, nose, or mouth with unwashed hands.  . Avoid close contact with people who are sick. . Avoid places or events with large numbers of people in one location, like concerts or sporting events. . Carefully consider travel plans you have or are making. . If you are planning any travel outside or inside the [place], visit the CDC's Travelers' Health webpage for the latest health notices. . If you have some symptoms but not all symptoms, continue to monitor at home and seek medical attention if your symptoms worsen. . If you are having a medical emergency, call 911.  HOME CARE . Only take medications as instructed by your medical team. . Drink plenty of fluids and get plenty of rest. . A steam or ultrasonic humidifier can help if you have congestion.   GET HELP RIGHT AWAY IF YOU HAVE EMERGENCY WARNING SIGNS** FOR COVID-19. If you or someone is showing any of these signs seek emergency medical care immediately. Call 911 or proceed to your closest emergency facility if: . You develop worsening high fever. . Trouble breathing . Bluish lips or face . Persistent pain  or pressure in the chest . New confusion . Inability to wake or stay awake . You cough up blood. . Your symptoms become more severe  **This list is not all possible symptoms. Contact your medical provider for any symptoms that are sever or concerning to you.  MAKE SURE YOU   Understand these instructions.  Will watch your condition.  Will get help right away if you are not doing well or get worse.  Your e-visit answers were reviewed by a board certified advanced clinical  practitioner to complete your personal care plan.  Depending on the condition, your plan could have included both over the counter or prescription medications.  If there is a problem please reply once you have received a response from your provider.  Your safety is important to [place].  If you have drug allergies check your prescription carefully.    You can use MyChart to ask questions about today's visit, request a non-urgent call back, or ask for a work or school excuse for 24 hours related to this e-Visit. If it has been greater than 24 hours you will need to follow up with your provider, or enter a new e-Visit to address those concerns. You will get an e-mail in the next two days asking about your experience.  I hope that your e-visit has been valuable and will speed your recovery. Thank you for using e-visits.      Greater than 5 minutes, yet less than 10 minutes of time have been spent researching, coordinating and implementing care for this patient today.

## 2019-05-06 ENCOUNTER — Ambulatory Visit: Payer: PRIVATE HEALTH INSURANCE | Attending: Internal Medicine

## 2019-05-06 DIAGNOSIS — Z20822 Contact with and (suspected) exposure to covid-19: Secondary | ICD-10-CM

## 2019-05-07 LAB — NOVEL CORONAVIRUS, NAA: SARS-CoV-2, NAA: DETECTED — AB

## 2019-05-31 NOTE — Progress Notes (Deleted)
Office Visit Note  Patient: Cynthia Fox             Date of Birth: 1970/12/27           MRN: [id number]             PCP: Colon Branch, MD Referring: Colon Branch, MD Visit Date: 06/04/2019 Occupation: @GUAROCC @  Subjective:  No chief complaint on file.   History of Present Illness: Cynthia Fox is a 49 y.o. female ***   Activities of Daily Living:  Patient reports morning stiffness for *** {minute/hour:19697}.   Patient {ACTIONS;DENIES/REPORTS:21021675::"Denies"} nocturnal pain.  Difficulty dressing/grooming: {ACTIONS;DENIES/REPORTS:21021675::"Denies"} Difficulty climbing stairs: {ACTIONS;DENIES/REPORTS:21021675::"Denies"} Difficulty getting out of chair: {ACTIONS;DENIES/REPORTS:21021675::"Denies"} Difficulty using hands for taps, buttons, cutlery, and/or writing: {ACTIONS;DENIES/REPORTS:21021675::"Denies"}  No Rheumatology ROS completed.   PMFS History:  Patient Active Problem List   Diagnosis Date Noted  . H. pylori infection, treated 06/2018 09/08/2018  . 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 Annual physical exam 10/24/2016  . Menopause 10/13/2016  . H/O seasonal allergies 03/23/2016  . Vitamin D deficiency 03/23/2016  . High risk medication use 03/22/2016  . Sjogren's syndrome ([place]) 06/25/2015  . PCP NOTES >>>>>>>>>>>>>>>>>>>>>> 06/25/2015  . Graves disease 06/01/2015  . Hyperprolactinemia ([place]) 01/12/2015  . Elevated antinuclear antibody (ANA) level 07/23/2014    Past Medical History:  Diagnosis Date  . Compressive optic neuropathy   . Hepatitis A   . Seasonal allergies   . Sjogren's disease ([place])    positive ANA nad Ro  . Thyroid eye disease     Family History  Problem Relation Age of Onset  . Hypertension Mother   . Retinal detachment Mother   . Thyroid disease Mother   . Diabetes Father   . Thyroid disease Sister   . Heart attack Maternal Grandfather   . Breast cancer Neg Hx   . Colon cancer Neg Hx    Past Surgical History:  Procedure  Laterality Date  . EYE SURGERY Bilateral 2017, 2018   release of optic pressure   . FUNCTIONAL ENDOSCOPIC SINUS SURGERY Bilateral 05/2015  . Orbital Decompression Bilateral 05/2015   Social History   Social History Narrative   Original from [employer]    Lives w/ husband   Immunization History  Administered Date(s) Administered  . Influenza,inj,Quad PF,6+ Mos 12/24/2018  . Tdap 07/30/2012     Objective: Vital Signs: There were no vitals taken for this visit.   Physical Exam   Musculoskeletal Exam: ***  CDAI Exam: CDAI Score: -- Patient Global: --; Provider Global: -- Swollen: --; Tender: -- Joint Exam 06/04/2019   No joint exam has been documented for this visit   There is currently no information documented on the homunculus. Go to the Rheumatology activity and complete the homunculus joint exam.  Investigation: No additional findings.  Imaging: No results found.  Recent Labs: Lab Results  Component Value Date   WBC 5.9 12/04/2018   HGB 13.6 12/04/2018   PLT 266 12/04/2018   NA 138 12/04/2018   K 4.1 12/04/2018   CL 101 12/04/2018   CO2 27 12/04/2018   GLUCOSE 93 12/04/2018   BUN 13 12/04/2018   CREATININE 0.77 12/04/2018   BILITOT 0.8 12/04/2018   ALKPHOS 104 11/21/2016   AST 21 12/04/2018   ALT 16 12/04/2018   PROT 7.7 12/04/2018   ALBUMIN 4.3 11/21/2016   CALCIUM 9.3 12/04/2018   GFRAA 104 12/04/2018    Speciality Comments: PLQ Eye Exam: 08/29/18 @ [facility] Follow  up in 1 year  Procedures:  No procedures performed Allergies: Patient has no known allergies.   Assessment / Plan:     Visit Diagnoses: No diagnosis found.  Orders: No orders of the defined types were placed in this encounter.  No orders of the defined types were placed in this encounter.   Face-to-face time spent with patient was *** minutes. Greater than 50% of time was spent in counseling and coordination of care.  Follow-Up Instructions: No follow-ups on file.   Gearldine Bienenstock, PA-C  Note - This record has been created using Dragon software.  Chart creation errors have been sought, but may not always  have been located. Such creation errors do not reflect on  the standard of medical care.

## 2019-06-04 ENCOUNTER — Ambulatory Visit: Payer: 59 | Admitting: Rheumatology

## 2019-06-04 ENCOUNTER — Other Ambulatory Visit: Payer: Self-pay | Admitting: Rheumatology

## 2019-06-04 DIAGNOSIS — M3501 Sicca syndrome with keratoconjunctivitis: Secondary | ICD-10-CM

## 2019-06-05 NOTE — Telephone Encounter (Signed)
Last Visit: 12/04/18 Next Visit: 07/11/19 Labs: 12/04/18 MCV and MCH are elevated. Rest of CBC WNL. CMP WNL.  Eye exam: 08/29/18 WNL  Left message to advise patient she is due to update labs.   Okay to refill 30 day supply PLQ?

## 2019-06-05 NOTE — Telephone Encounter (Signed)
Ok to refill 30-day supply

## 2019-06-18 ENCOUNTER — Encounter: Payer: PRIVATE HEALTH INSURANCE | Admitting: Obstetrics & Gynecology

## 2019-07-08 NOTE — Progress Notes (Signed)
Office Visit Note  Patient: Cynthia Fox             Date of Birth: 09-21-1970           MRN: [id number]             PCP: Wanda Plump, MD Referring: Wanda Plump, MD Visit Date: 07/11/2019 Occupation: @GUAROCC @  Subjective:  Sicca symptoms   History of Present Illness: Cynthia Fox is a 50 y.o. female with history of Sjogren's syndrome.  Patient is taking Plaquenil 200 mg 1 tablet by mouth daily and pilocarpine 5 mg twice daily.  She continues to have chronic sicca symptoms.  She uses over-the-counter products to help with mouth dryness.  She continues to see her dentist every 6 months.  She states that she has had some recent dental caries.  She denies any recent salivary stones.  She continues to experience intermittent symptoms of Raynaud's in her fingers which is worse in the winter months.  She denies any digital ulcerations.  She denies any joint pain or joint swelling at this time.  She states that her lower back pain has resolved.  She has no new concerns at this time. Her eye exam is scheduled in May 2021.  Activities of Daily Living:  Patient reports morning stiffness for 0   minutes.   Patient Denies nocturnal pain.  Difficulty dressing/grooming: Denies Difficulty climbing stairs: Denies Difficulty getting out of chair: Denies Difficulty using hands for taps, buttons, cutlery, and/or writing: Denies  Review of Systems  Constitutional: Negative for fatigue.  HENT: Positive for mouth dryness. Negative for mouth sores and nose dryness.   Eyes: Positive for dryness. Negative for pain and visual disturbance.  Respiratory: Negative for cough, hemoptysis, shortness of breath and difficulty breathing.   Cardiovascular: Negative for chest pain, palpitations, hypertension and swelling in legs/feet.  Gastrointestinal: Negative for blood in stool, constipation and diarrhea.  Endocrine: Negative for increased urination.  Genitourinary: Negative for painful urination.    Musculoskeletal: Negative for arthralgias, joint pain, joint swelling, myalgias, muscle weakness, morning stiffness, muscle tenderness and myalgias.  Skin: Negative for color change, pallor, rash, hair loss, nodules/bumps, skin tightness, ulcers and sensitivity to sunlight.  Allergic/Immunologic: Negative for susceptible to infections.  Neurological: Negative for dizziness, numbness, headaches and weakness.  Hematological: Negative for swollen glands.  Psychiatric/Behavioral: Negative for depressed mood and sleep disturbance. The patient is not nervous/anxious.     PMFS History:  Patient Active Problem List   Diagnosis Date Noted  . H. pylori infection, treated 06/2018 09/08/2018  . 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 Annual physical exam 10/24/2016  . Menopause 10/13/2016  . H/O seasonal allergies 03/23/2016  . Vitamin D deficiency 03/23/2016  . High risk medication use 03/22/2016  . Sjogren's syndrome (HCC) 06/25/2015  . PCP NOTES >>>>>>>>>>>>>>>>>>>>>> 06/25/2015  . Graves disease 06/01/2015  . Hyperprolactinemia (HCC) 01/12/2015  . Elevated antinuclear antibody (ANA) level 07/23/2014    Past Medical History:  Diagnosis Date  . Compressive optic neuropathy   . Hepatitis A   . Seasonal allergies   . Sjogren's disease (HCC)    positive ANA nad Ro  . Thyroid eye disease     Family History  Problem Relation Age of Onset  . Hypertension Mother   . Retinal detachment Mother   . Thyroid disease Mother   . Diabetes Father   . Thyroid disease Sister   . Heart attack Maternal Grandfather   . Breast cancer Neg Hx   .  Colon cancer Neg Hx    Past Surgical History:  Procedure Laterality Date  . EYE SURGERY Bilateral 2017, 2018   release of optic pressure   . FUNCTIONAL ENDOSCOPIC SINUS SURGERY Bilateral 05/2015  . Orbital Decompression Bilateral 05/2015   Social History   Social History Narrative   Original from [employer]    Lives w/ husband   Immunization History   Administered Date(s) Administered  . Influenza,inj,Quad PF,6+ Mos 12/24/2018  . Tdap 07/30/2012     Objective: Vital Signs: BP 106/65 (BP Location: Left Arm, Patient Position: Sitting, Cuff Size: Normal)   Pulse 81   Resp 16   Ht 5' (1.524 m)   Wt 153 lb 12.8 oz (69.8 kg)   BMI 30.04 kg/m    Physical Exam Vitals and nursing note reviewed.  Constitutional:      Appearance: She is well-developed.  HENT:     Head: Normocephalic and atraumatic.  Eyes:     Conjunctiva/sclera: Conjunctivae normal.  Pulmonary:     Effort: Pulmonary effort is normal.  Abdominal:     General: Bowel sounds are normal.     Palpations: Abdomen is soft.  Musculoskeletal:     Cervical back: Normal range of motion.  Lymphadenopathy:     Cervical: No cervical adenopathy.  Skin:    General: Skin is warm and dry.     Capillary Refill: Capillary refill takes less than 2 seconds.  Neurological:     Mental Status: She is alert and oriented to person, place, and time.  Psychiatric:        Behavior: Behavior normal.      Musculoskeletal Exam: C-spine, thoracic spine, lumbar spine good range of motion.  No midline spinal tenderness.  Shoulder joints, elbow joints, wrist joints, MCPs, PIPs and DIPs good range of motion with no synovitis.  She has complete fist formation bilaterally.  Hip joints have good range of motion with no discomfort.  No tenderness over trochanteric bursa bilaterally.  Knee joints have good range of motion with no warmth or effusion.  Ankle joints have good range of motion no tenderness or synovitis.  CDAI Exam: CDAI Score: -- Patient Global: --; Provider Global: -- Swollen: --; Tender: -- Joint Exam 07/11/2019   No joint exam has been documented for this visit   There is currently no information documented on the homunculus. Go to the Rheumatology activity and complete the homunculus joint exam.  Investigation: No additional findings.  Imaging: No results found.  Recent  Labs: Lab Results  Component Value Date   WBC 5.9 12/04/2018   HGB 13.6 12/04/2018   PLT 266 12/04/2018   NA 138 12/04/2018   K 4.1 12/04/2018   CL 101 12/04/2018   CO2 27 12/04/2018   GLUCOSE 93 12/04/2018   BUN 13 12/04/2018   CREATININE 0.77 12/04/2018   BILITOT 0.8 12/04/2018   ALKPHOS 104 11/21/2016   AST 21 12/04/2018   ALT 16 12/04/2018   PROT 7.7 12/04/2018   ALBUMIN 4.3 11/21/2016   CALCIUM 9.3 12/04/2018   GFRAA 104 12/04/2018    Speciality Comments: PLQ Eye Exam: 08/29/18 @ [facility] Follow up in 1 year  Procedures:  No procedures performed Allergies: Patient has no known allergies.   Assessment / Plan:     Visit Diagnoses: Sjogren's syndrome with keratoconjunctivitis sicca (HCC) - Positive ANA, positive Ro, positive rheumatoid factor, negative CCP: She continues to have chronic sicca symptoms.  She uses over-the-counter products for symptomatic relief.  She takes  Plaquenil and pilocarpine 5 mg twice daily.  We discussed the use of a humidifier in her room at night.  She was encouraged to continue to see the dentist every 6 months.  According to the patient she has had recent dental caries.  We discussed that due to her history of Sjogren's she is at higher risk for dental caries.  She does not have any parotid swelling or tenderness on exam today.  She will continue taking Plaquenil and pilocarpine as prescribed.  Refills of both medications were sent to the pharmacy.  We will check CBC, CMP, UA, ANA, RF, and SPEP today.  She was advised to notify us if she develops any new or worsening symptoms.  She will follow-up in the office in 5 months.- Plan: hydroxychloroquine (PLAQUENIL) 200 MG tablet  High risk medication use - Plaquenil 200 mg 1 tablet by mouth twice daily.  CBC and CMP were drawn on 12/04/2018.  We will update CBC and CMP today.  She has an upcoming Plaquenil eye exam in May 2021.PLQ Eye Exam: 08/29/18 @ [place] Follow up in 1 year.   Raynaud's  disease without gangrene - In the wintertime she takes norvasc, which improves her symptoms.  We discussed the importance of avoiding triggers and keeping her core body temperature warm.  She has no digital ulcerations or signs of gangrene on exam.  Chronic midline low back pain without sciatica: Resolved  Primary insomnia: She has been sleeping well at night.  History of vitamin D deficiency: She is taking calcium and vitamin D supplement.  History of Graves' disease    Orders: Orders Placed This Encounter  Procedures  . COMPLETE METABOLIC PANEL WITH GFR  . CBC with Differential/Platelet  . ANA  . Rheumatoid factor  . Serum protein electrophoresis with reflex  . Urinalysis, Routine w reflex microscopic   Meds ordered this encounter  Medications  . hydroxychloroquine (PLAQUENIL) 200 MG tablet    Sig: TAKE 1 TABLET BY MOUTH EVERY DAY    Dispense:  90 tablet    Refill:  0  . pilocarpine (SALAGEN) 5 MG tablet    Sig: TAKE 1 TABLET(5 MG) BY MOUTH THREE TIMES DAILY AS NEEDED    Dispense:  270 tablet    Refill:  1     Follow-Up Instructions: Return in about 5 months (around 12/11/2019) for Sjogren's syndrome.   Ofilia Neas, PA-C   I examined and evaluated the patient with Hazel Sams PA.  Patient continues to have sicca symptoms.  She has noticed improvement with pilocarpine.  Prescription refill was given today.  We will monitor labs.  She had no synovitis on examination.  The plan of care was discussed as noted above.  Bo Merino, MD  Note - This record has been created using Editor, commissioning.  Chart creation errors have been sought, but may not always  have been located. Such creation errors do not reflect on  the standard of medical care.

## 2019-07-11 ENCOUNTER — Encounter: Payer: Self-pay | Admitting: Rheumatology

## 2019-07-11 ENCOUNTER — Other Ambulatory Visit: Payer: Self-pay

## 2019-07-11 ENCOUNTER — Ambulatory Visit (INDEPENDENT_AMBULATORY_CARE_PROVIDER_SITE_OTHER): Payer: PRIVATE HEALTH INSURANCE | Admitting: Rheumatology

## 2019-07-11 VITALS — BP 106/65 | HR 81 | Resp 16 | Ht 60.0 in | Wt 153.8 lb

## 2019-07-11 DIAGNOSIS — M3501 Sicca syndrome with keratoconjunctivitis: Secondary | ICD-10-CM

## 2019-07-11 DIAGNOSIS — G8929 Other chronic pain: Secondary | ICD-10-CM

## 2019-07-11 DIAGNOSIS — Z8639 Personal history of other endocrine, nutritional and metabolic disease: Secondary | ICD-10-CM

## 2019-07-11 DIAGNOSIS — I7300 Raynaud's syndrome without gangrene: Secondary | ICD-10-CM | POA: Diagnosis not present

## 2019-07-11 DIAGNOSIS — M545 Low back pain, unspecified: Secondary | ICD-10-CM

## 2019-07-11 DIAGNOSIS — Z79899 Other long term (current) drug therapy: Secondary | ICD-10-CM

## 2019-07-11 DIAGNOSIS — F5101 Primary insomnia: Secondary | ICD-10-CM

## 2019-07-11 MED ORDER — HYDROXYCHLOROQUINE SULFATE 200 MG PO TABS: ORAL_TABLET | ORAL | 0 refills | Status: DC

## 2019-07-11 MED ORDER — PILOCARPINE HCL 5 MG PO TABS: ORAL_TABLET | ORAL | 1 refills | Status: DC

## 2019-07-15 LAB — COMPLETE METABOLIC PANEL WITH GFR
AG Ratio: 1.4 (calc) (ref 1.0–2.5)
ALT: 19 U/L (ref 6–29)
AST: 21 U/L (ref 10–35)
Albumin: 4.2 g/dL (ref 3.6–5.1)
Alkaline phosphatase (APISO): 127 U/L (ref 37–153)
BUN: 18 mg/dL (ref 7–25)
CO2: 28 mmol/L (ref 20–32)
Calcium: 9.9 mg/dL (ref 8.6–10.4)
Chloride: 104 mmol/L (ref 98–110)
Creat: 0.68 mg/dL (ref 0.50–1.05)
GFR, Est African American: 117 mL/min/{1.73_m2} (ref >=60)
GFR, Est Non African American: 101 mL/min/{1.73_m2} (ref >=60)
Globulin: 3.1 g/dL (calc) (ref 1.9–3.7)
Glucose, Bld: 107 mg/dL — ABNORMAL HIGH (ref 65–99)
Potassium: 4 mmol/L (ref 3.5–5.3)
Sodium: 141 mmol/L (ref 135–146)
Total Bilirubin: 0.7 mg/dL (ref 0.2–1.2)
Total Protein: 7.3 g/dL (ref 6.1–8.1)

## 2019-07-15 LAB — CBC WITH DIFFERENTIAL/PLATELET
Absolute Monocytes: 466 cells/uL (ref 200–950)
Basophils Absolute: 52 cells/uL (ref 0–200)
Basophils Relative: 0.7 %
Eosinophils Absolute: 607 cells/uL — ABNORMAL HIGH (ref 15–500)
Eosinophils Relative: 8.2 %
HCT: 42.7 % (ref 35.0–45.0)
Hemoglobin: 14 g/dL (ref 11.7–15.5)
Lymphs Abs: 2235 cells/uL (ref 850–3900)
MCH: 32 pg (ref 27.0–33.0)
MCHC: 32.8 g/dL (ref 32.0–36.0)
MCV: 97.5 fL (ref 80.0–100.0)
MPV: 11.9 fL (ref 7.5–12.5)
Monocytes Relative: 6.3 %
Neutro Abs: 4040 cells/uL (ref 1500–7800)
Neutrophils Relative %: 54.6 %
Platelets: 332 10*3/uL (ref 140–400)
RBC: 4.38 10*6/uL (ref 3.80–5.10)
RDW: 12.5 % (ref 11.0–15.0)
Total Lymphocyte: 30.2 %
WBC: 7.4 10*3/uL (ref 3.8–10.8)

## 2019-07-15 LAB — URINALYSIS, ROUTINE W REFLEX MICROSCOPIC
Bilirubin Urine: NEGATIVE
Glucose, UA: NEGATIVE
Hgb urine dipstick: NEGATIVE
Ketones, ur: NEGATIVE
Leukocytes,Ua: NEGATIVE
Nitrite: NEGATIVE
Protein, ur: NEGATIVE
Specific Gravity, Urine: 1.023 (ref 1.001–1.03)
pH: <=5 (ref 5.0–8.0)

## 2019-07-15 LAB — ANTI-NUCLEAR AB-TITER (ANA TITER)
ANA TITER: 1:1280 {titer} — ABNORMAL HIGH
ANA Titer 1: 1:80 {titer} — ABNORMAL HIGH

## 2019-07-15 LAB — PROTEIN ELECTROPHORESIS, SERUM, WITH REFLEX
Albumin ELP: 4.3 g/dL (ref 3.8–4.8)
Alpha 1: 0.3 g/dL (ref 0.2–0.3)
Alpha 2: 0.6 g/dL (ref 0.5–0.9)
Beta 2: 0.5 g/dL (ref 0.2–0.5)
Beta Globulin: 0.6 g/dL (ref 0.4–0.6)
Gamma Globulin: 1.2 g/dL (ref 0.8–1.7)
Total Protein: 7.5 g/dL (ref 6.1–8.1)

## 2019-07-15 LAB — ANA: Anti Nuclear Antibody (ANA): POSITIVE — AB

## 2019-07-15 LAB — RHEUMATOID FACTOR: Rheumatoid fact SerPl-aCnc: <14 IU/mL (ref <14)

## 2019-07-15 NOTE — Progress Notes (Signed)
ANA remains positive.  CBC and CMP WNL.  UA normal. RF negative.  SPEP WNL.

## 2019-08-07 ENCOUNTER — Ambulatory Visit (INDEPENDENT_AMBULATORY_CARE_PROVIDER_SITE_OTHER): Payer: PRIVATE HEALTH INSURANCE | Admitting: Obstetrics & Gynecology

## 2019-08-07 ENCOUNTER — Encounter: Payer: Self-pay | Admitting: Obstetrics & Gynecology

## 2019-08-07 ENCOUNTER — Other Ambulatory Visit: Payer: Self-pay

## 2019-08-07 VITALS — BP 122/78 | Ht <= 58 in | Wt 148.0 lb

## 2019-08-07 DIAGNOSIS — E6609 Other obesity due to excess calories: Secondary | ICD-10-CM

## 2019-08-07 DIAGNOSIS — N952 Postmenopausal atrophic vaginitis: Secondary | ICD-10-CM

## 2019-08-07 DIAGNOSIS — Z01419 Encounter for gynecological examination (general) (routine) without abnormal findings: Secondary | ICD-10-CM

## 2019-08-07 DIAGNOSIS — Z6830 Body mass index (BMI) 30.0-30.9, adult: Secondary | ICD-10-CM

## 2019-08-07 MED ORDER — ESTRADIOL 10 MCG VA TABS: 1.0000 | ORAL_TABLET | VAGINAL | 4 refills | Status: DC

## 2019-08-07 NOTE — Progress Notes (Signed)
Cynthia Fox [birth year] [id number]   History:    49 y.o. G0 Married  RP:  Established patient presenting for annual gyn exam   HPI: Menopause, well on no hormone replacement therapy.  No postmenopausal bleeding.  No pelvic pain.  No pain with IC, using Vagifem.  Urine and bowel movements normal.  Breasts normal.  Body mass index 30.31.  Not walking regularly.  Health labs with Dr. Drue Novel.  No colono yet, will organize through Dr Drue Novel.   Past medical history,surgical history, family history and social history were all reviewed and documented in the EPIC chart.  Gynecologic History No LMP recorded. Patient is perimenopausal.  Obstetric History OB History  Gravida Para Term Preterm AB Living  0 0 0 0 0 0  SAB TAB Ectopic Multiple Live Births  0 0 0 0       ROS: A ROS was performed and pertinent positives and negatives are included in the history.  GENERAL: No fevers or chills. HEENT: No change in vision, no earache, sore throat or sinus congestion. NECK: No pain or stiffness. CARDIOVASCULAR: No chest pain or pressure. No palpitations. PULMONARY: No shortness of breath, cough or wheeze. GASTROINTESTINAL: No abdominal pain, nausea, vomiting or diarrhea, melena or bright red blood per rectum. GENITOURINARY: No urinary frequency, urgency, hesitancy or dysuria. MUSCULOSKELETAL: No joint or muscle pain, no back pain, no recent trauma. DERMATOLOGIC: No rash, no itching, no lesions. ENDOCRINE: No polyuria, polydipsia, no heat or cold intolerance. No recent change in weight. HEMATOLOGICAL: No anemia or easy bruising or bleeding. NEUROLOGIC: No headache, seizures, numbness, tingling or weakness. PSYCHIATRIC: No depression, no loss of interest in normal activity or change in sleep pattern.     Exam:   BP 122/78   Ht 4\' 10"  (1.473 m)   Wt 148 lb (67.1 kg)   BMI 30.93 kg/m   Body mass index is 30.93 kg/m.  General appearance : Well developed well nourished female. No acute distress HEENT:  Eyes: no retinal hemorrhage or exudates,  Neck supple, trachea midline, no carotid bruits, no thyroidmegaly Lungs: Clear to auscultation, no rhonchi or wheezes, or rib retractions  Heart: Regular rate and rhythm, no murmurs or gallops Breast:Examined in sitting and supine position were symmetrical in appearance, no palpable masses or tenderness,  no skin retraction, no nipple inversion, no nipple discharge, no skin discoloration, no axillary or supraclavicular lymphadenopathy Abdomen: no palpable masses or tenderness, no rebound or guarding Extremities: no edema or skin discoloration or tenderness  Pelvic: Vulva: Normal             Vagina: No gross lesions or discharge  Cervix: No gross lesions or discharge  Uterus  AV, normal size, shape and consistency, non-tender and mobile  Adnexa  Without masses or tenderness  Anus: Normal   Assessment/Plan:  49 y.o. female for annual exam   1. Well female exam with routine gynecological exam Normal gynecologic exam in menopause.  No indication to repeat a Pap test this year.  Breast exam normal.  Mammogram negative October 2020.  Will schedule a colonoscopy through Dr. November 2020.  Health labs with Dr. Drue Novel.  2. Post-menopausal atrophic vaginitis Well on Vagifem.  No contraindication to continue.  Prescription sent to pharmacy.  3. Class 1 obesity due to excess calories without serious comorbidity with body mass index (BMI) of 30.0 to 30.9 in adult Recommend a lower calorie/carb diet such as Drue Novel.  Aerobic activities 5 times a week and light weightlifting  every 2 days.   Princess Bruins MD, 3:28 PM 08/07/2019

## 2019-08-09 ENCOUNTER — Encounter: Payer: Self-pay | Admitting: Obstetrics & Gynecology

## 2019-08-09 NOTE — Patient Instructions (Signed)
1. Well female exam with routine gynecological exam Normal gynecologic exam in menopause.  No indication to repeat a Pap test this year.  Breast exam normal.  Mammogram negative October 2020.  Will schedule a colonoscopy through Dr. Drue Novel.  Health labs with Dr. Drue Novel.  2. Post-menopausal atrophic vaginitis Well on Vagifem.  No contraindication to continue.  Prescription sent to pharmacy.  3. Class 1 obesity due to excess calories without serious comorbidity with body mass index (BMI) of 30.0 to 30.9 in adult Recommend a lower calorie/carb diet such as [employer].  Aerobic activities 5 times a week and light weightlifting every 2 days.  Cynthia Fox, it was a pleasure seeing you today!

## 2019-08-29 ENCOUNTER — Ambulatory Visit: Payer: PRIVATE HEALTH INSURANCE | Admitting: Internal Medicine

## 2019-10-15 ENCOUNTER — Other Ambulatory Visit: Payer: Self-pay | Admitting: *Deleted

## 2019-10-15 DIAGNOSIS — M3501 Sicca syndrome with keratoconjunctivitis: Secondary | ICD-10-CM

## 2019-10-15 MED ORDER — HYDROXYCHLOROQUINE SULFATE 200 MG PO TABS: ORAL_TABLET | ORAL | 0 refills | Status: DC

## 2019-10-15 NOTE — Telephone Encounter (Signed)
Refill request received via fax  Last Visit: 07/11/2019 Next Visit: 12/12/2019 Labs: 07/11/2019 CBC and CMP WNL Eye exam: 08/29/18  Current Dose per office note 07/11/2019: Plaquenil 200 mg 1 tablet by mouth twice daily.  EL:[id number] syndrome    Patient advised she is due to update PLQ eye exam. Patient states she has an appointment to update in September 2021.   Okay to refill Plaquenil?

## 2019-11-29 NOTE — Progress Notes (Signed)
Office Visit Note  Patient: Cynthia Fox             Date of Birth: Mar 17, 1971           MRN: [id number]             PCP: Wanda Plump, MD Referring: Wanda Plump, MD Visit Date: 12/12/2019 Occupation: @GUAROCC @  Subjective:  Other (right shoulder pain/tenderness )   History of Present Illness: Cynthia Fox is a 49 y.o. female history of Sjogren's syndrome.  She continues to have dry mouth and dry eyes.  Her Raynaud's is not very active currently.  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  She has to work on the computer all day.  She denies any joint swelling.  Is off-and-on discomfort in her lower back.  Activities of Daily Living:  Patient reports morning stiffness for a few  minutes.   Patient Denies nocturnal pain.  Difficulty dressing/grooming: Denies Difficulty climbing stairs: Denies Difficulty getting out of chair: Denies Difficulty using hands for taps, buttons, cutlery, and/or writing: Denies  Review of Systems  Constitutional: Negative for fatigue.  HENT: Positive for mouth dryness. Negative for mouth sores and nose dryness.   Eyes: Positive for dryness.  Respiratory: Negative for shortness of breath and difficulty breathing.   Cardiovascular: Negative for chest pain and palpitations.  Gastrointestinal: Negative for blood in stool, constipation and diarrhea.  Endocrine: Negative for increased urination.  Genitourinary: Negative for difficulty urinating.  Musculoskeletal: Positive for arthralgias, joint pain, myalgias, morning stiffness and myalgias. Negative for joint swelling and muscle tenderness.  Skin: Negative for color change, rash and redness.  Allergic/Immunologic: Negative for susceptible to infections.  Neurological: Negative for dizziness, numbness, headaches, memory loss and weakness.  Hematological: Negative for bruising/bleeding tendency.  Psychiatric/Behavioral:  Negative for confusion.    PMFS History:  Patient Active Problem List   Diagnosis Date Noted  . H. pylori infection, treated 06/2018 09/08/2018  . 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 Annual physical exam 10/24/2016  . Menopause 10/13/2016  . H/O seasonal allergies 03/23/2016  . Vitamin D deficiency 03/23/2016  . High risk medication use 03/22/2016  . Sjogren's syndrome (HCC) 06/25/2015  . PCP NOTES >>>>>>>>>>>>>>>>>>>>>> 06/25/2015  . Graves disease 06/01/2015  . Hyperprolactinemia (HCC) 01/12/2015  . Elevated antinuclear antibody (ANA) level 07/23/2014    Past Medical History:  Diagnosis Date  . Compressive optic neuropathy   . Hepatitis A   . Seasonal allergies   . Sjogren's disease (HCC)    positive ANA nad Ro  . Thyroid eye disease     Family History  Problem Relation Age of Onset  . Hypertension Mother   . Retinal detachment Mother   . Thyroid disease Mother   . Diabetes Father   . Thyroid disease Sister   . Heart attack Maternal Grandfather   . Breast cancer Neg Hx   . Colon cancer Neg Hx    Past Surgical History:  Procedure Laterality Date  . EYE SURGERY Bilateral 2017, 2018   release of optic pressure   . FUNCTIONAL ENDOSCOPIC SINUS SURGERY Bilateral 05/2015  . Orbital Decompression Bilateral 05/2015   Social History   Social History Narrative   Original from 06/2015    Lives w/ husband   Immunization History  Administered Date(s) Administered  . Influenza,inj,Quad PF,6+ Mos 12/24/2018  . PFIZER SARS-COV-2 Vaccination 07/04/2019, 07/25/2019  . Tdap  07/30/2012     Objective: Vital Signs: BP 117/80 (BP Location: Left Arm, Patient Position: Sitting, Cuff Size: Normal)   Pulse 80   Resp 12   Ht 5' (1.524 m)   Wt 153 lb 9.6 oz (69.7 kg)   BMI 30.00 kg/m    Physical Exam Vitals and nursing note reviewed.  Constitutional:      Appearance: She is well-developed.  HENT:     Head: Normocephalic and atraumatic.  Eyes:     Conjunctiva/sclera:  Conjunctivae normal.  Cardiovascular:     Rate and Rhythm: Normal rate and regular rhythm.     Heart sounds: Normal heart sounds.  Pulmonary:     Effort: Pulmonary effort is normal.     Breath sounds: Normal breath sounds.  Abdominal:     General: Bowel sounds are normal.     Palpations: Abdomen is soft.  Musculoskeletal:     Cervical back: Normal range of motion.  Lymphadenopathy:     Cervical: No cervical adenopathy.  Skin:    General: Skin is warm and dry.     Capillary Refill: Capillary refill takes less than 2 seconds.  Neurological:     Mental Status: She is alert and oriented to person, place, and time.  Psychiatric:        Behavior: Behavior normal.      Musculoskeletal Exam: C-spine thoracic and lumbar spine good range of motion. She had discomfort range of motion of her right shoulder joint with right subacromial tenderness. Left shoulder joint was in good range of motion. Elbow joints, wrist joints with good range of motion. She has mild DIP and PIP thickening with no synovitis. Hip joints and knee joints with good range of motion. She had no MTP tenderness.  CDAI Exam: CDAI Score: -- Patient Global: --; Provider Global: -- Swollen: --; Tender: -- Joint Exam 12/12/2019   No joint exam has been documented for this visit   There is currently no information documented on the homunculus. Go to the Rheumatology activity and complete the homunculus joint exam.  Investigation: No additional findings.  Imaging: No results found.  Recent Labs: Lab Results  Component Value Date   WBC 7.4 07/11/2019   HGB 14.0 07/11/2019   PLT 332 07/11/2019   NA 141 07/11/2019   K 4.0 07/11/2019   CL 104 07/11/2019   CO2 28 07/11/2019   GLUCOSE 107 (H) 07/11/2019   BUN 18 07/11/2019   CREATININE 0.68 07/11/2019   BILITOT 0.7 07/11/2019   ALKPHOS 104 11/21/2016   AST 21 07/11/2019   ALT 19 07/11/2019   PROT 7.3 07/11/2019   PROT 7.5 07/11/2019   ALBUMIN 4.3 11/21/2016    CALCIUM 9.9 07/11/2019   GFRAA 117 07/11/2019    Speciality Comments: PLQ Eye Exam: 08/29/18 @ [facility] Follow up in 1 year  Procedures:  Large Joint Inj: R subacromial bursa on 12/12/2019 3:36 PM Indications: pain Details: 27 G 1.5 in needle, posterior approach  Arthrogram: No  Medications: 1 mL lidocaine 1 %; 40 mg triamcinolone acetonide 40 MG/ML Aspirate: 0 mL Outcome: tolerated well, no immediate complications Procedure, treatment alternatives, risks and benefits explained, specific risks discussed. Consent was given by the patient. Immediately prior to procedure a time out was called to verify the correct patient, procedure, equipment, support staff and site/side marked as required. Patient was prepped and draped in the usual sterile fashion.     Allergies: Patient has no known allergies.   Assessment / Plan:  Visit Diagnoses: Sjogren's syndrome with keratoconjunctivitis sicca (HCC) - Positive ANA, positive Ro, positive rheumatoid factor, negative CCP: pilocarpine 5 mg  -she continues to have sicca symptoms. Pilocarpine has been useful. She also takes hydroxychloroquine. Plan: Urinalysis, Routine w reflex microscopic, Rheumatoid factor, Serum protein electrophoresis with reflex, Sedimentation rate  High risk medication use - Plaquenil 200 mg 1 tablet by mouth daily. PLQ Eye Exam: December 27, 2019- Plan: CBC with Differential/Platelet, COMPLETE METABOLIC PANEL WITH GFR  Raynaud's disease without gangrene - In the wintertime she takes norvasc, which improves her symptoms.   Acute right shoulder pain -she has been having discomfort in her right shoulder joint. 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 Plan: XR Shoulder Right.  X-ray of the shoulder joint was unremarkable.  She requested cortisone injection to the right shoulder. After informed consent was obtained right shoulder joint was injected with cortisone. A handout on exercises was given.  Primary  insomnia-good sleep hygiene was discussed.  History of vitamin D deficiency  History of Graves' disease  Chronic midline low back pain without sciatica -she has intermittent discomfort.  Orders: Orders Placed This Encounter  Procedures  . Large Joint Inj  . XR Shoulder Right  . CBC with Differential/Platelet  . COMPLETE METABOLIC PANEL WITH GFR  . Urinalysis, Routine w reflex microscopic  . Rheumatoid factor  . Serum protein electrophoresis with reflex  . Sedimentation rate   No orders of the defined types were placed in this encounter.     Follow-Up Instructions: Return in about 5 months (around 05/13/2020) for Sjogren's.   Pollyann Savoy, MD  Note - This record has been created using Animal nutritionist.  Chart creation errors have been sought, but may not always  have been located. Such creation errors do not reflect on  the standard of medical care.

## 2019-12-12 ENCOUNTER — Other Ambulatory Visit: Payer: Self-pay

## 2019-12-12 ENCOUNTER — Ambulatory Visit (INDEPENDENT_AMBULATORY_CARE_PROVIDER_SITE_OTHER): Payer: 59 | Admitting: Rheumatology

## 2019-12-12 ENCOUNTER — Ambulatory Visit: Payer: Self-pay

## 2019-12-12 ENCOUNTER — Encounter: Payer: Self-pay | Admitting: Rheumatology

## 2019-12-12 VITALS — BP 117/80 | HR 80 | Resp 12 | Ht 60.0 in | Wt 153.6 lb

## 2019-12-12 DIAGNOSIS — F5101 Primary insomnia: Secondary | ICD-10-CM

## 2019-12-12 DIAGNOSIS — G8929 Other chronic pain: Secondary | ICD-10-CM

## 2019-12-12 DIAGNOSIS — I7300 Raynaud's syndrome without gangrene: Secondary | ICD-10-CM | POA: Diagnosis not present

## 2019-12-12 DIAGNOSIS — M25511 Pain in right shoulder: Secondary | ICD-10-CM | POA: Diagnosis not present

## 2019-12-12 DIAGNOSIS — M545 Low back pain, unspecified: Secondary | ICD-10-CM

## 2019-12-12 DIAGNOSIS — Z79899 Other long term (current) drug therapy: Secondary | ICD-10-CM | POA: Diagnosis not present

## 2019-12-12 DIAGNOSIS — Z8639 Personal history of other endocrine, nutritional and metabolic disease: Secondary | ICD-10-CM

## 2019-12-12 DIAGNOSIS — M3501 Sicca syndrome with keratoconjunctivitis: Secondary | ICD-10-CM

## 2019-12-12 MED ORDER — TRIAMCINOLONE ACETONIDE 40 MG/ML IJ SUSP
40.0000 mg | INTRAMUSCULAR | Status: AC | PRN
  Administered 2019-12-12: 40 mg via INTRA_ARTICULAR

## 2019-12-12 MED ORDER — LIDOCAINE HCL 1 % IJ SOLN
1.0000 mL | INTRAMUSCULAR | Status: AC | PRN
  Administered 2019-12-12: 1 mL

## 2019-12-12 NOTE — Patient Instructions (Addendum)
COVID-19 vaccine recommendations:   COVID-19 vaccine is recommended for everyone (unless you are allergic to a vaccine component), even if you are on a medication that suppresses your immune system.   If you are on Methotrexate, Cellcept (mycophenolate), Rinvoq, Xeljanz, and Olumiant- hold the medication for 1 week after each vaccine. Hold Methotrexate for 2 weeks after the single dose COVID-19 vaccine.   If you are on Orencia subcutaneous injection - hold medication one week prior to and one week after the first COVID-19 vaccine dose (only).   If you are on Orencia IV infusions- time vaccination administration so that the first COVID-19 vaccination will occur four weeks after the infusion and postpone the subsequent infusion by one week.   If you are on Cyclophosphamide or Rituxan infusions please contact your doctor prior to receiving the COVID-19 vaccine.   Do not take Tylenol or any anti-inflammatory medications (NSAIDs) 24 hours prior to the COVID-19 vaccination.   There is no direct evidence about the efficacy of the COVID-19 vaccine in individuals who are on medications that suppress the immune system.   Even if you are fully vaccinated, and you are on any medications that suppress your immune system, please continue to wear a mask, maintain at least six feet social distance and practice hand hygiene.   If you develop a COVID-19 infection, please contact your PCP or our office to determine if you need antibody infusion.  The booster vaccine is now available for immunocompromised patients. It is advised that if you had Pfizer vaccine you should get Pfizer booster.  If you had a Moderna vaccine then you should get a Moderna booster. Johnson and Johnson does not have a booster vaccine at this time.  Please see the following web sites for updated information.    https://www.[facility].org/Portals/0/Files/COVID-19-Vaccination-Patient-Resources.pdf  https://www.[facility].org/About-Us/Newsroom/Press-Releases/ID/1159   Shoulder Exercises Ask your health care provider which exercises are safe for you. Do exercises exactly as told by your health care provider and adjust them as directed. It is normal to feel mild stretching, pulling, tightness, or discomfort as you do these exercises. Stop right away if you feel sudden pain or your pain gets worse. Do not begin these exercises until told by your health care provider. Stretching exercises External rotation and abduction This exercise is sometimes called corner stretch. This exercise rotates your arm outward (external rotation) and moves your arm out from your body (abduction). 1. Stand in a doorway with one of your feet slightly in front of the other. This is called a staggered stance. If you cannot reach your forearms to the door frame, stand facing a corner of a room. 2. Choose one of the following positions as told by your health care provider: ? Place your hands and forearms on the door frame above your head. ? Place your hands and forearms on the door frame at the height of your head. ? Place your hands on the door frame at the height of your elbows. 3. Slowly move your weight onto your front foot until you feel a stretch across your chest and in the front of your shoulders. Keep your head and chest upright and keep your abdominal muscles tight. 4. Hold for __________ seconds. 5. To release the stretch, shift your weight to your back foot. Repeat __________ times. Complete this exercise __________ times a day. Extension, standing 1. Stand and hold a broomstick, a cane, or a similar object behind your back. ? Your hands should be a little wider than shoulder width apart. ?   Your palms should face away from your back. 2. Keeping your elbows straight and your shoulder muscles relaxed, move the  stick away from your body until you feel a stretch in your shoulders (extension). ? Avoid shrugging your shoulders while you move the stick. Keep your shoulder blades tucked down toward the middle of your back. 3. Hold for __________ seconds. 4. Slowly return to the starting position. Repeat __________ times. Complete this exercise __________ times a day. Range-of-motion exercises Pendulum  1. Stand near a wall or a surface that you can hold onto for balance. 2. Bend at the waist and let your left / right arm hang straight down. Use your other arm to support you. Keep your back straight and do not lock your knees. 3. Relax your left / right arm and shoulder muscles, and move your hips and your trunk so your left / right arm swings freely. Your arm should swing because of the motion of your body, not because you are using your arm or shoulder muscles. 4. Keep moving your hips and trunk so your arm swings in the following directions, as told by your health care provider: ? Side to side. ? Forward and backward. ? In clockwise and counterclockwise circles. 5. Continue each motion for __________ seconds, or for as long as told by your health care provider. 6. Slowly return to the starting position. Repeat __________ times. Complete this exercise __________ times a day. Shoulder flexion, standing  1. Stand and hold a broomstick, a cane, or a similar object. Place your hands a little more than shoulder width apart on the object. Your left / right hand should be palm up, and your other hand should be palm down. 2. Keep your elbow straight and your shoulder muscles relaxed. Push the stick up with your healthy arm to raise your left / right arm in front of your body, and then over your head until you feel a stretch in your shoulder (flexion). ? Avoid shrugging your shoulder while you raise your arm. Keep your shoulder blade tucked down toward the middle of your back. 3. Hold for __________  seconds. 4. Slowly return to the starting position. Repeat __________ times. Complete this exercise __________ times a day. Shoulder abduction, standing 1. Stand and hold a broomstick, a cane, or a similar object. Place your hands a little more than shoulder width apart on the object. Your left / right hand should be palm up, and your other hand should be palm down. 2. Keep your elbow straight and your shoulder muscles relaxed. Push the object across your body toward your left / right side. Raise your left / right arm to the side of your body (abduction) until you feel a stretch in your shoulder. ? Do not raise your arm above shoulder height unless your health care provider tells you to do that. ? If directed, raise your arm over your head. ? Avoid shrugging your shoulder while you raise your arm. Keep your shoulder blade tucked down toward the middle of your back. 3. Hold for __________ seconds. 4. Slowly return to the starting position. Repeat __________ times. Complete this exercise __________ times a day. Internal rotation  1. Place your left / right hand behind your back, palm up. 2. Use your other hand to dangle an exercise band, a towel, or a similar object over your shoulder. Grasp the band with your left / right hand so you are holding on to both ends. 3. Gently pull up on the band   until you feel a stretch in the front of your left / right shoulder. The movement of your arm toward the center of your body is called internal rotation. ? Avoid shrugging your shoulder while you raise your arm. Keep your shoulder blade tucked down toward the middle of your back. 4. Hold for __________ seconds. 5. Release the stretch by letting go of the band and lowering your hands. Repeat __________ times. Complete this exercise __________ times a day. Strengthening exercises External rotation  1. Sit in a stable chair without armrests. 2. Secure an exercise band to a stable object at elbow height on  your left / right side. 3. Place a soft object, such as a folded towel or a small pillow, between your left / right upper arm and your body to move your elbow about 4 inches (10 cm) away from your side. 4. Hold the end of the exercise band so it is tight and there is no slack. 5. Keeping your elbow pressed against the soft object, slowly move your forearm out, away from your abdomen (external rotation). Keep your body steady so only your forearm moves. 6. Hold for __________ seconds. 7. Slowly return to the starting position. Repeat __________ times. Complete this exercise __________ times a day. Shoulder abduction  1. Sit in a stable chair without armrests, or stand up. 2. Hold a __________ weight in your left / right hand, or hold an exercise band with both hands. 3. Start with your arms straight down and your left / right palm facing in, toward your body. 4. Slowly lift your left / right hand out to your side (abduction). Do not lift your hand above shoulder height unless your health care provider tells you that this is safe. ? Keep your arms straight. ? Avoid shrugging your shoulder while you do this movement. Keep your shoulder blade tucked down toward the middle of your back. 5. Hold for __________ seconds. 6. Slowly lower your arm, and return to the starting position. Repeat __________ times. Complete this exercise __________ times a day. Shoulder extension 1. Sit in a stable chair without armrests, or stand up. 2. Secure an exercise band to a stable object in front of you so it is at shoulder height. 3. Hold one end of the exercise band in each hand. Your palms should face each other. 4. Straighten your elbows and lift your hands up to shoulder height. 5. Step back, away from the secured end of the exercise band, until the band is tight and there is no slack. 6. Squeeze your shoulder blades together as you pull your hands down to the sides of your thighs (extension). Stop when your  hands are straight down by your sides. Do not let your hands go behind your body. 7. Hold for __________ seconds. 8. Slowly return to the starting position. Repeat __________ times. Complete this exercise __________ times a day. Shoulder row 1. Sit in a stable chair without armrests, or stand up. 2. Secure an exercise band to a stable object in front of you so it is at waist height. 3. Hold one end of the exercise band in each hand. Position your palms so that your thumbs are facing the ceiling (neutral position). 4. Bend each of your elbows to a 90-degree angle (right angle) and keep your upper arms at your sides. 5. Step back until the band is tight and there is no slack. 6. Slowly pull your elbows back behind you. 7. Hold for __________ seconds. 8.   Slowly return to the starting position. Repeat __________ times. Complete this exercise __________ times a day. Shoulder press-ups  1. Sit in a stable chair that has armrests. Sit upright, with your feet flat on the floor. 2. Put your hands on the armrests so your elbows are bent and your fingers are pointing forward. Your hands should be about even with the sides of your body. 3. Push down on the armrests and use your arms to lift yourself off the chair. Straighten your elbows and lift yourself up as much as you comfortably can. ? Move your shoulder blades down, and avoid letting your shoulders move up toward your ears. ? Keep your feet on the ground. As you get stronger, your feet should support less of your body weight as you lift yourself up. 4. Hold for __________ seconds. 5. Slowly lower yourself back into the chair. Repeat __________ times. Complete this exercise __________ times a day. Wall push-ups  1. Stand so you are facing a stable wall. Your feet should be about one arm-length away from the wall. 2. Lean forward and place your palms on the wall at shoulder height. 3. Keep your feet flat on the floor as you bend your elbows and  lean forward toward the wall. 4. Hold for __________ seconds. 5. Straighten your elbows to push yourself back to the starting position. Repeat __________ times. Complete this exercise __________ times a day. This information is not intended to replace advice given to you by your health care provider. Make sure you discuss any questions you have with your health care provider. Document Revised: 07/20/2018 Document Reviewed: 04/27/2018 Elsevier Patient Education  2020 [employer]  

## 2019-12-17 LAB — CBC WITH DIFFERENTIAL/PLATELET
Absolute Monocytes: 612 cells/uL (ref 200–950)
Basophils Absolute: 61 cells/uL (ref 0–200)
Basophils Relative: 0.9 %
Eosinophils Absolute: 707 cells/uL — ABNORMAL HIGH (ref 15–500)
Eosinophils Relative: 10.4 %
HCT: 37.4 % (ref 35.0–45.0)
Hemoglobin: 12.7 g/dL (ref 11.7–15.5)
Lymphs Abs: 2128 cells/uL (ref 850–3900)
MCH: 34 pg — ABNORMAL HIGH (ref 27.0–33.0)
MCHC: 34 g/dL (ref 32.0–36.0)
MCV: 100.3 fL — ABNORMAL HIGH (ref 80.0–100.0)
MPV: 11.6 fL (ref 7.5–12.5)
Monocytes Relative: 9 %
Neutro Abs: 3291 cells/uL (ref 1500–7800)
Neutrophils Relative %: 48.4 %
Platelets: 264 10*3/uL (ref 140–400)
RBC: 3.73 10*6/uL — ABNORMAL LOW (ref 3.80–5.10)
RDW: 12.8 % (ref 11.0–15.0)
Total Lymphocyte: 31.3 %
WBC: 6.8 10*3/uL (ref 3.8–10.8)

## 2019-12-17 LAB — COMPLETE METABOLIC PANEL WITH GFR
AG Ratio: 1.6 (calc) (ref 1.0–2.5)
ALT: 53 U/L — ABNORMAL HIGH (ref 6–29)
AST: 42 U/L — ABNORMAL HIGH (ref 10–35)
Albumin: 4.4 g/dL (ref 3.6–5.1)
Alkaline phosphatase (APISO): 144 U/L (ref 37–153)
BUN: 12 mg/dL (ref 7–25)
CO2: 26 mmol/L (ref 20–32)
Calcium: 9.1 mg/dL (ref 8.6–10.4)
Chloride: 105 mmol/L (ref 98–110)
Creat: 0.6 mg/dL (ref 0.50–1.05)
GFR, Est African American: 122 mL/min/{1.73_m2} (ref >=60)
GFR, Est Non African American: 106 mL/min/{1.73_m2} (ref >=60)
Globulin: 2.7 g/dL (calc) (ref 1.9–3.7)
Glucose, Bld: 99 mg/dL (ref 65–139)
Potassium: 3.9 mmol/L (ref 3.5–5.3)
Sodium: 140 mmol/L (ref 135–146)
Total Bilirubin: 0.7 mg/dL (ref 0.2–1.2)
Total Protein: 7.1 g/dL (ref 6.1–8.1)

## 2019-12-17 LAB — PROTEIN ELECTROPHORESIS, SERUM, WITH REFLEX
Albumin ELP: 4.3 g/dL (ref 3.8–4.8)
Alpha 1: 0.3 g/dL (ref 0.2–0.3)
Alpha 2: 0.7 g/dL (ref 0.5–0.9)
Beta 2: 0.4 g/dL (ref 0.2–0.5)
Beta Globulin: 0.6 g/dL (ref 0.4–0.6)
Gamma Globulin: 0.9 g/dL (ref 0.8–1.7)
Total Protein: 7.1 g/dL (ref 6.1–8.1)

## 2019-12-17 LAB — URINALYSIS, ROUTINE W REFLEX MICROSCOPIC
Bilirubin Urine: NEGATIVE
Glucose, UA: NEGATIVE
Hgb urine dipstick: NEGATIVE
Ketones, ur: NEGATIVE
Leukocytes,Ua: NEGATIVE
Nitrite: NEGATIVE
Protein, ur: NEGATIVE
Specific Gravity, Urine: 1.022 (ref 1.001–1.03)
pH: 6.5 (ref 5.0–8.0)

## 2019-12-17 LAB — RHEUMATOID FACTOR: Rheumatoid fact SerPl-aCnc: <14 IU/mL (ref <14)

## 2019-12-17 LAB — SEDIMENTATION RATE: Sed Rate: 11 mm/h (ref 0–30)

## 2019-12-18 ENCOUNTER — Telehealth: Payer: Self-pay | Admitting: Rheumatology

## 2019-12-18 NOTE — Telephone Encounter (Signed)
Patient called stating she was returning your call.   °

## 2019-12-18 NOTE — Telephone Encounter (Signed)
Returned patient's call and reviewed lab results. See results not for details.

## 2019-12-20 ENCOUNTER — Other Ambulatory Visit (INDEPENDENT_AMBULATORY_CARE_PROVIDER_SITE_OTHER): Payer: 59

## 2019-12-20 ENCOUNTER — Encounter: Payer: Self-pay | Admitting: Internal Medicine

## 2019-12-20 ENCOUNTER — Ambulatory Visit: Payer: 59 | Admitting: Internal Medicine

## 2019-12-20 ENCOUNTER — Other Ambulatory Visit: Payer: Self-pay

## 2019-12-20 VITALS — BP 110/70 | HR 87 | Ht 60.0 in | Wt 151.0 lb

## 2019-12-20 DIAGNOSIS — E221 Hyperprolactinemia: Secondary | ICD-10-CM

## 2019-12-20 DIAGNOSIS — E0500 Thyrotoxicosis with diffuse goiter without thyrotoxic crisis or storm: Secondary | ICD-10-CM | POA: Diagnosis not present

## 2019-12-20 LAB — T4, FREE: Free T4: 1.01 ng/dL (ref 0.60–1.60)

## 2019-12-20 LAB — TSH: TSH: 2.26 u[IU]/mL (ref 0.35–4.50)

## 2019-12-20 LAB — T3, FREE: T3, Free: 3.3 pg/mL (ref 2.3–4.2)

## 2019-12-20 NOTE — Patient Instructions (Signed)
Please stop at the lab at [facility].  Please come back for a follow-up appointment in 1 year.

## 2019-12-20 NOTE — Progress Notes (Signed)
Patient ID: Cynthia Fox, female   DOB: [birth year], 49 y.o.   MRN: [id number]  This visit occurred during the SARS-CoV-2 public health emergency.  Safety protocols were in place, including screening questions prior to the visit, additional usage of staff PPE, and extensive cleaning of exam room while observing appropriate contact time as indicated for disinfecting solutions.   HPI  Cynthia Fox is a 49 y.o.-year-old female, initially referred by Dr Lily Peer, returning for Graves ds, Graves ophthalmopathy, and elevated prolactin. Last visit 1 year and 5 months ago -virtual.  She had Covid19 in 04/2019.  She had a mild form and recovered well.  Graves ds:  Reviewed history: Pt had a thyroid uptake in 08/08/2014 (unfortunately, a scan was not done at that time) and this returned at ULN, 28.4% (10-30%).  We started MMI and the dose was increased to 10 mg in am and 5 mg in pm >> TSH high  (as she did not return for recheck as advised) >> we stopped MMi >> TSH 0.09 >> advised her to restart MMi 5 mg daily >> dose decreased to 2.5 mg in 08/2015.  She is off methimazole since 09/2015.  Her Graves' antibodies normalized: Lab Results  Component Value Date   TSI <89 12/05/2018   TSI <89 08/07/2017   TSI 351 (H) 07/29/2016   TSI 471 (H) 01/29/2016   TSI 247 (H) 08/14/2015   TSI 297 (H) 09/16/2014   Reviewed her TFTs: Lab Results  Component Value Date   TSH 1.57 12/05/2018   TSH 1.69 02/06/2018   TSH 1.78 08/07/2017   TSH 1.60 02/01/2017   TSH 2.80 07/29/2016   TSH 1.13 05/12/2016   TSH 1.53 04/08/2016   TSH 0.810 01/29/2016   TSH 1.27 11/11/2015   TSH 4.35 09/29/2015   FREET4 1.18 12/05/2018   FREET4 0.77 02/06/2018   FREET4 0.78 08/07/2017   FREET4 0.84 02/01/2017   FREET4 1.1 07/29/2016   FREET4 0.81 05/12/2016   FREET4 1.03 04/08/2016   FREET4 1.31 01/29/2016   FREET4 0.82 11/11/2015   FREET4 0.73 09/29/2015   Pt denies: - feeling nodules in neck - hoarseness -  dysphagia - choking - SOB with lying down  Graves ophthalmopathy: Reviewed history: 05/18/2015:  Called by Dr. Dione Booze: Patient seen in his office recently for regular eye exam while on Plaquenil. She noticed a visual field defect in one of her eyes and had her come back for repeat. The second visual field was worse. He obtained an MRI of her brain with focus on the pituitary that showed normal brain structures and pituitary gland, however ocular muscles were enlarged. The third visual field was even worse than the previous two. She was referred to [facility] ophthalmology, where she is seeing Dr. Toni Arthurs. She is planning to perform decompression surgery. However, due to the severity of the visual field defects, she recommended pulse steroids with Solu-Medrol. She can do this in our office, however patient would like to have this locally. Dr. Dione Booze called me to see if this can be arranged.   The recommended Solu-Medrol regimen: - 500 mg once a week for 6 weeks, followed by - 250 mg once a week for the next 6 weeks.  I d/w Dr Toni Arthurs and plan was for pt to have decompressive surgery first >> she had this 05/21/2015, and then started the above Solumedrol regimen - last dose 08/2015.   She had significant diplopia in the past that she could only drive with corrective  glasses.  However, after her strabismus surgery in 10/2016, she has very little double vision only when looking to the left.  She does not need prism glasses anymore.  Elevated prolactin (normal: 2.8-29.2)   Reviewed prolactin levels: Lab Results  Component Value Date   PROLACTIN 12.9 12/05/2018   PROLACTIN 10.0 08/07/2017   PROLACTIN 11.3 07/29/2016   PROLACTIN 28.5 09/25/2015   PROLACTIN 30.9 01/12/2015   PROLACTIN 39.9 07/22/2014   PROLACTIN 40.3 07/16/2014   Previously: Component     Latest Ref Rng 09/16/2014  Prolactin, Total      28.0  Prolactin, Monomeric     3.2 - 25.2 ng/mL 23.0   No headaches or  galactorrhea.  She also has a dx. Of Sjogren Sd.  She is seeing Dr. Pamella Pert.  ROS: Constitutional: no weight gain/no weight loss, no fatigue, no subjective hyperthermia, + subjective hypothermia Eyes: no blurry vision, no xerophthalmia ENT: no sore throat, + see HPI Cardiovascular: no CP/no SOB/no palpitations/no leg swelling Respiratory: no cough/no SOB/no wheezing Gastrointestinal: no N/no V/no D/no C/no acid reflux Musculoskeletal: no muscle aches/no joint aches Skin: no rashes, no hair loss, + itching Neurological: no tremors/no numbness/no tingling/no dizziness  I reviewed pt's medications, allergies, PMH, social hx, family hx, and changes were documented in the history of present illness. Otherwise, unchanged from my initial visit note.  Past Medical History:  Diagnosis Date  . Compressive optic neuropathy   . Hepatitis A   . Seasonal allergies   . Sjogren's disease (HCC)    positive ANA nad Ro  . Thyroid eye disease    Past Surgical History:  Procedure Laterality Date  . EYE SURGERY Bilateral 2017, 2018   release of optic pressure   . FUNCTIONAL ENDOSCOPIC SINUS SURGERY Bilateral 05/2015  . Orbital Decompression Bilateral 05/2015    History   Social History  . Marital Status: Married    Spouse Name: N/A  . 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 Smoking status: Never Smoker   . Smokeless tobacco: Never Used  . Alcohol Use: Yes     Comment: weekends   Current Outpatient Medications on File Prior to Visit  Medication Sig Dispense Refill  . Ascorbic Acid (VITAMIN C) 100 MG tablet Take 100 mg by mouth daily.    . Calcium Carbonate-Vit D-Min (CALCIUM 1200 PO) Take by mouth.    . Cholecalciferol (VITAMIN D3) 5000 units CAPS Take 1 capsule by mouth 2 (two) times a week.    . Estradiol 10 MCG TABS vaginal tablet Place 1 tablet (10 mcg total) vaginally 2 (two) times a week. 24 tablet 4  . hydroxychloroquine  (PLAQUENIL) 200 MG tablet TAKE 1 TABLET BY MOUTH EVERY DAY 90 tablet 0  . pilocarpine (SALAGEN) 5 MG tablet TAKE 1 TABLET(5 MG) BY MOUTH THREE TIMES DAILY AS NEEDED 270 tablet 1  . RESTASIS 0.05 % ophthalmic emulsion 1 drop 2 (two) times daily.    . Selenium 200 MCG CAPS Take 200 mcg by mouth daily.     No current facility-administered medications on file prior to visit.   No Known Allergies   Family History  Problem Relation Age of Onset  . Hypertension Mother   . Retinal detachment Mother   . Thyroid disease Mother   . Diabetes Father   . Thyroid disease Sister   . Heart attack Maternal Grandfather   . Breast cancer Neg Hx   .  Colon cancer Neg Hx   - also see history of present illness  PE: BP 110/70   Pulse 87   Ht 5' (1.524 m)   Wt 151 lb (68.5 kg)   SpO2 98%   BMI 29.49 kg/m  Body mass index is 29.49 kg/m. Wt Readings from Last 3 Encounters:  12/20/19 151 lb (68.5 kg)  12/12/19 153 lb 9.6 oz (69.7 kg)  08/07/19 148 lb (67.1 kg)   Constitutional: Slightly overweight, in NAD Eyes: PERRLA, EOMI, no exophthalmos ENT: moist mucous membranes, no thyromegaly, no cervical lymphadenopathy Cardiovascular: RRR, No MRG Respiratory: CTA B Gastrointestinal: abdomen soft, NT, ND, BS+ Musculoskeletal: no deformities, strength intact in all 4 Skin: moist, warm, no rashes Neurological: no tremor with outstretched hands, DTR normal in all 4  ASSESSMENT: 1. Mild Graves ds.  2. Graves ophthalmopathy - had Solumedrol - 500 mg weekly x 6 doses, then 250 mg weekly x 6 doses - last in 08/2015.   3. Elevated prolactin  PLAN:  1. Patient with mild Graves' disease, based on the presence of TSI antibodies and her uptake and scan, however, with very significant Graves' ophthalmopathy, now improved.  Also, her TSI's have improved to normal at last check. -She was on methimazole but was able to come off it 09/2015 with TFTs in the normal range after stopping the medication.  She is  asymptomatic, with the exception of cold intolerance.  She also has skin itching relieved by emollient creams. She continues on selenium. -Latest TFTs were reviewed and these were normal in 01/2018 -We will recheck her TFTs today.  We will also add TSI antibodies. -Otherwise, I will see her back in a year  2. GO  -No complaints at this visit -She has a history of significant diplopia, for which we gave her serial Solu-Medrol injections and she has eye surgeries (decompression surgery 05/2015, strabismus surgery in 10/2016-successful) and corrective eyeglasses -We will check her TSI antibodies today -She has a coming up appointment with ophthalmology  3. Elevated PRL  -Patient with previously high prolactin levels, normalized at last check, on no medications. -We ruled out macroprolactin -She is now postmenopausal -No headaches or galactorrhea -We will check another prolactin level today   Component     Latest Ref Rng & Units 12/20/2019  TSH     0.35 - 4.50 uIU/mL 2.26  T4,Free(Direct)     0.60 - 1.60 ng/dL 6.81  Triiodothyronine,Free,Serum     2.3 - 4.2 pg/mL 3.3  Prolactin     ng/mL 8.7  TSI     <140 % baseline <89   All labs are normal.  Carlus Pavlov, MD PhD [facility] Endocrinology

## 2019-12-24 LAB — PROLACTIN: Prolactin: 8.7 ng/mL

## 2019-12-24 LAB — THYROID STIMULATING IMMUNOGLOBULIN: TSI: <89 % baseline (ref <140)

## 2019-12-25 ENCOUNTER — Ambulatory Visit (INDEPENDENT_AMBULATORY_CARE_PROVIDER_SITE_OTHER): Payer: 59 | Admitting: Internal Medicine

## 2019-12-25 ENCOUNTER — Telehealth: Payer: Self-pay

## 2019-12-25 ENCOUNTER — Encounter: Payer: Self-pay | Admitting: Internal Medicine

## 2019-12-25 ENCOUNTER — Other Ambulatory Visit: Payer: Self-pay

## 2019-12-25 VITALS — BP 125/82 | HR 88 | Temp 98.1°F | Resp 16 | Ht 60.0 in | Wt 150.2 lb

## 2019-12-25 DIAGNOSIS — Z1159 Encounter for screening for other viral diseases: Secondary | ICD-10-CM | POA: Diagnosis not present

## 2019-12-25 DIAGNOSIS — R7989 Other specified abnormal findings of blood chemistry: Secondary | ICD-10-CM

## 2019-12-25 DIAGNOSIS — Z0000 Encounter for general adult medical examination without abnormal findings: Secondary | ICD-10-CM | POA: Diagnosis not present

## 2019-12-25 NOTE — Progress Notes (Cosign Needed)
Subjective:    Patient ID: Cynthia Fox, female    DOB: 03-Sep-1970, 49 y.o.   MRN: [id number]  DOS:  12/25/2019 Type of visit - description: CPX In general feeling well. Had a flu shot last week, the next day she developed malaise, sore throat, cough.  Mild subjective fever with normal temperature. Eventually took some over-the-counter medicines and now she feels better except for minimal residual cough. She sees Endo and rheumatology regularly  BP Readings from Last 3 Encounters:  12/25/19 125/82  12/20/19 110/70  12/12/19 117/80     Review of Systems  Other than above, a 14 point review of systems is negative    Past Medical History:  Diagnosis Date  . Compressive optic neuropathy   . Hepatitis A   . Seasonal allergies   . Sjogren's disease (HCC)    positive ANA nad Ro  . Thyroid eye disease     Past Surgical History:  Procedure Laterality Date  . EYE SURGERY Bilateral 2017, 2018   release of optic pressure   . FUNCTIONAL ENDOSCOPIC SINUS SURGERY Bilateral 05/2015  . Orbital Decompression Bilateral 05/2015    Allergies as of 12/25/2019   No Known Allergies     Medication List       Accurate as of December 25, 2019 11:59 PM. If you have any questions, ask your nurse or doctor.        CALCIUM 1200 PO Take by mouth.   Estradiol 10 MCG Tabs vaginal tablet Place 1 tablet (10 mcg total) vaginally 2 (two) times a week.   hydroxychloroquine 200 MG tablet Commonly known as: PLAQUENIL TAKE 1 TABLET BY MOUTH EVERY DAY   pilocarpine 5 MG tablet Commonly known as: SALAGEN TAKE 1 TABLET(5 MG) BY MOUTH THREE TIMES DAILY AS NEEDED   Restasis 0.05 % ophthalmic emulsion Generic drug: cycloSPORINE 1 drop 2 (two) times daily.   Selenium 200 MCG Caps Take 200 mcg by mouth daily.   vitamin C 100 MG tablet Take 100 mg by mouth daily.   Vitamin D3 125 MCG (5000 UT) Caps Take 1 capsule by mouth 2 (two) times a week.          Objective:   Physical Exam BP  125/82 (BP Location: Left Arm, Patient Position: Sitting, Cuff Size: Small)   Pulse 88   Temp 98.1 F (36.7 C) (Oral)   Resp 16   Ht 5' (1.524 m)   Wt 150 lb 4 oz (68.2 kg)   SpO2 100%   BMI 29.34 kg/m  General:   Well developed, NAD, BMI noted.  HEENT:  Normocephalic . Face symmetric, atraumatic.  TMs normal Lungs:  CTA B Normal respiratory effort, no intercostal retractions, no accessory muscle use. Heart: RRR,  no murmur.  Abdomen:  Not distended, soft, non-tender. No rebound or rigidity.   Skin: Not pale. Not jaundice Lower extremities: no pretibial edema bilaterally  Neurologic:  alert & oriented X3.  Speech normal, gait appropriate for age and unassisted Psych--  Cognition and judgment appear intact.  Cooperative with normal attention span and concentration.  Behavior appropriate. No anxious or depressed appearing.     Assessment     Assessment ENDO:    Dr Elvera Lennox --Luiz Blare' disease dx 2016, ophthalmopathy dx ~ 04-2015 , had diplopia, s/p orbital decompression surgery ~ 05-2015   --h/o hyperprolactinemia  RHEUM:  Dr Victory Dakin  Sjogren syndrome  Dx 2016  --  + ANA Menopausal   PLAN: Here for CPX Chronic medical problems  managed by Endo and rheumatology. RTC 1 year    This visit occurred during the SARS-CoV-2 public health emergency.  Safety protocols were in place, including screening questions prior to the visit, additional usage of staff PPE, and extensive cleaning of exam room while observing appropriate contact time as indicated for disinfecting solutions.

## 2019-12-25 NOTE — Patient Instructions (Addendum)
Only return  the Cologuard if your insurance covers it.  If not, please let me know, we can always do the simple stool test here at the office  GO TO THE LAB : Get the blood work     GO TO THE FRONT DESK, PLEASE SCHEDULE YOUR APPOINTMENTS Come back for a physical exam in 1 year

## 2019-12-25 NOTE — Progress Notes (Signed)
Pre visit review using our clinic review tool, if applicable. No additional management support is needed unless otherwise documented below in the visit note. 

## 2019-12-25 NOTE — Telephone Encounter (Signed)
Cologuard ordered

## 2019-12-26 ENCOUNTER — Encounter: Payer: Self-pay | Admitting: Internal Medicine

## 2019-12-26 LAB — HEPATITIS C ANTIBODY
Hepatitis C Ab: NONREACTIVE
SIGNAL TO CUT-OFF: 0.02 (ref <1.00)

## 2019-12-26 LAB — HEPATIC FUNCTION PANEL
AG Ratio: 1.7 (calc) (ref 1.0–2.5)
ALT: 43 U/L — ABNORMAL HIGH (ref 6–29)
AST: 31 U/L (ref 10–35)
Albumin: 4.8 g/dL (ref 3.6–5.1)
Alkaline phosphatase (APISO): 158 U/L — ABNORMAL HIGH (ref 37–153)
Bilirubin, Direct: 0.1 mg/dL (ref 0.0–0.2)
Globulin: 2.8 g/dL (calc) (ref 1.9–3.7)
Indirect Bilirubin: 0.6 mg/dL (calc) (ref 0.2–1.2)
Total Bilirubin: 0.7 mg/dL (ref 0.2–1.2)
Total Protein: 7.6 g/dL (ref 6.1–8.1)

## 2019-12-26 LAB — LIPID PANEL
Cholesterol: 199 mg/dL (ref <200)
HDL: 73 mg/dL (ref >=50)
LDL Cholesterol (Calc): 109 mg/dL (calc) — ABNORMAL HIGH
Non-HDL Cholesterol (Calc): 126 mg/dL (calc) (ref <130)
Total CHOL/HDL Ratio: 2.7 (calc) (ref <5.0)
Triglycerides: 78 mg/dL (ref <150)

## 2019-12-26 NOTE — Assessment & Plan Note (Signed)
-  Td 2014 per patient - s/p C-19 shot - had a  flu shot last week, the day after she developed some symptoms, she is already feeling better, see HPI. - female care per gyn: [facility] 01/2019, PAP 03/2018 --CCS: Never had a colonoscopy; 3 options d/w pt, elected cologuard -labs: FLP, LFTs (LFTs were slightly elevated recently) -Diet and exercise: Discussed

## 2019-12-26 NOTE — Assessment & Plan Note (Signed)
Here for CPX Chronic medical problems managed by Endo and rheumatology. RTC 1 year

## 2019-12-27 NOTE — Addendum Note (Signed)
Addended byConrad Paia D on: 12/27/2019 04:21 PM   Modules accepted: Orders

## 2019-12-30 ENCOUNTER — Telehealth: Payer: Self-pay | Admitting: Internal Medicine

## 2019-12-30 NOTE — Telephone Encounter (Signed)
Left voice mail to schedule appt

## 2019-12-30 NOTE — Telephone Encounter (Signed)
-----   Message from [place], [place] sent at 12/30/2019  7:52 AM EDT ----- Regarding: Lab appt Needs lab appt in 2 months please.

## 2019-12-31 ENCOUNTER — Encounter: Payer: Self-pay | Admitting: Rheumatology

## 2020-01-05 LAB — COLOGUARD: Cologuard: NEGATIVE

## 2020-01-09 LAB — COLOGUARD: COLOGUARD: NEGATIVE

## 2020-01-09 LAB — EXTERNAL GENERIC LAB PROCEDURE: COLOGUARD: NEGATIVE

## 2020-01-09 NOTE — Telephone Encounter (Signed)
Results released to My Chart.  

## 2020-01-09 NOTE — Telephone Encounter (Signed)
If Cologuard is negative, then let her know.

## 2020-01-09 NOTE — Telephone Encounter (Signed)
Received negative Cologuard, okay to release to Pt in your absence?

## 2020-01-17 ENCOUNTER — Other Ambulatory Visit: Payer: Self-pay | Admitting: Internal Medicine

## 2020-01-17 DIAGNOSIS — Z1231 Encounter for screening mammogram for malignant neoplasm of breast: Secondary | ICD-10-CM

## 2020-02-11 ENCOUNTER — Other Ambulatory Visit: Payer: Self-pay

## 2020-02-11 ENCOUNTER — Ambulatory Visit
Admission: RE | Admit: 2020-02-11 | Discharge: 2020-02-11 | Disposition: A | Discharge status: Home / Self Care | Payer: 59 | Source: Ambulatory Visit | Attending: Internal Medicine | Admitting: Internal Medicine

## 2020-02-11 DIAGNOSIS — Z1231 Encounter for screening mammogram for malignant neoplasm of breast: Secondary | ICD-10-CM

## 2020-02-24 ENCOUNTER — Other Ambulatory Visit (INDEPENDENT_AMBULATORY_CARE_PROVIDER_SITE_OTHER): Payer: 59

## 2020-02-24 ENCOUNTER — Other Ambulatory Visit: Payer: Self-pay

## 2020-02-24 DIAGNOSIS — R7989 Other specified abnormal findings of blood chemistry: Secondary | ICD-10-CM

## 2020-02-24 LAB — HEPATIC FUNCTION PANEL
ALT: 39 U/L — ABNORMAL HIGH (ref 0–35)
AST: 34 U/L (ref 0–37)
Albumin: 4.4 g/dL (ref 3.5–5.2)
Alkaline Phosphatase: 140 U/L — ABNORMAL HIGH (ref 39–117)
Bilirubin, Direct: 0.1 mg/dL (ref 0.0–0.3)
Total Bilirubin: 0.6 mg/dL (ref 0.2–1.2)
Total Protein: 7.1 g/dL (ref 6.0–8.3)

## 2020-02-24 NOTE — Addendum Note (Signed)
Addended by: Mervin Kung A on: 02/24/2020 07:17 AM   Modules accepted: Orders

## 2020-02-25 ENCOUNTER — Encounter: Payer: Self-pay | Admitting: Internal Medicine

## 2020-02-25 ENCOUNTER — Encounter: Payer: Self-pay | Admitting: Rheumatology

## 2020-02-26 NOTE — Addendum Note (Signed)
Addended by: Conrad [place] D on: 02/26/2020 11:11 AM   Modules accepted: Orders

## 2020-02-28 ENCOUNTER — Other Ambulatory Visit: Payer: Self-pay

## 2020-02-28 ENCOUNTER — Ambulatory Visit (HOSPITAL_BASED_OUTPATIENT_CLINIC_OR_DEPARTMENT_OTHER)
Admission: RE | Admit: 2020-02-28 | Discharge: 2020-02-28 | Disposition: A | Discharge status: Home / Self Care | Payer: 59 | Source: Ambulatory Visit | Attending: Internal Medicine | Admitting: Internal Medicine

## 2020-02-28 DIAGNOSIS — R7989 Other specified abnormal findings of blood chemistry: Secondary | ICD-10-CM | POA: Diagnosis present

## 2020-03-03 NOTE — Addendum Note (Signed)
Addended by: Conrad [place] D on: 03/03/2020 10:40 AM   Modules accepted: Orders

## 2020-03-04 ENCOUNTER — Other Ambulatory Visit: Payer: Self-pay | Admitting: *Deleted

## 2020-03-04 DIAGNOSIS — M3501 Sicca syndrome with keratoconjunctivitis: Secondary | ICD-10-CM

## 2020-03-04 MED ORDER — HYDROXYCHLOROQUINE SULFATE 200 MG PO TABS: ORAL_TABLET | ORAL | 0 refills | Status: DC

## 2020-03-04 NOTE — Telephone Encounter (Signed)
Last Visit: 12/12/2019 Next Visit: 05/20/2020 Labs: 12/12/2019 RBC count is borderline low. MCV and Mch are borderline elevated but stable. AST and ALT are elevated. Eye exam:  12/31/2019 WNL  Current Dose per office note 12/12/2019: Plaquenil 200 mg 1 tablet by mouth daily DX: Sjogren's syndrome with keratoconjunctivitis sicca   Okay to refill Plaquenil?

## 2020-03-09 ENCOUNTER — Encounter: Payer: Self-pay | Admitting: Nurse Practitioner

## 2020-03-25 ENCOUNTER — Ambulatory Visit: Payer: 59 | Admitting: Nurse Practitioner

## 2020-03-25 ENCOUNTER — Encounter: Payer: Self-pay | Admitting: Nurse Practitioner

## 2020-03-25 ENCOUNTER — Other Ambulatory Visit (INDEPENDENT_AMBULATORY_CARE_PROVIDER_SITE_OTHER): Payer: 59

## 2020-03-25 VITALS — BP 116/70 | HR 92 | Ht 60.0 in | Wt 151.0 lb

## 2020-03-25 DIAGNOSIS — R7989 Other specified abnormal findings of blood chemistry: Secondary | ICD-10-CM

## 2020-03-25 LAB — HEPATIC FUNCTION PANEL
ALT: 50 U/L — ABNORMAL HIGH (ref 0–35)
AST: 32 U/L (ref 0–37)
Albumin: 4.6 g/dL (ref 3.5–5.2)
Alkaline Phosphatase: 147 U/L — ABNORMAL HIGH (ref 39–117)
Bilirubin, Direct: 0.2 mg/dL (ref 0.0–0.3)
Total Bilirubin: 1.1 mg/dL (ref 0.2–1.2)
Total Protein: 7.8 g/dL (ref 6.0–8.3)

## 2020-03-25 NOTE — Patient Instructions (Signed)
If you are age 51 or older, your body mass index should be between 23-30. Your Body mass index is 29.49 kg/m. If this is out of the aforementioned range listed, please consider follow up with your Primary Care Provider.  If you are age 95 or younger, your body mass index should be between 19-25. Your Body mass index is 29.49 kg/m. If this is out of the aformentioned range listed, please consider follow up with your Primary Care Provider.    Your provider has requested that you go to the basement level for lab work before leaving today. Press "B" on the elevator. The lab is located at the first door on the left as you exit the elevator.  Due to recent changes in healthcare laws, you may see the results of your imaging and laboratory studies on MyChart before your provider has had a chance to review them.  We understand that in some cases there may be results that are confusing or concerning to you. Not all laboratory results come back in the same time frame and the provider may be waiting for multiple results in order to interpret others.  Please give [place] 48 hours in order for your provider to thoroughly review all the results before contacting the office for clarification of your results.    It was great seeing you today!  Thank you for choosing [facility].

## 2020-03-25 NOTE — Progress Notes (Signed)
ASSESSMENT AND PLAN    # 49 yo female with history of new mild elevation in liver chemistries in setting of tylenol, aleve and ibuprofen.  Stopped NSAIDs, enzymes improved, nearly normalized but then had new mild elevation in alk phos. Abdominal US negative.  --Last labs were a month ago. Will repeat liver tests today --Thyroid studies normal.  --HCV ab negative. Gives history of hepatitis as a child ( probably HAV).  Wil check Hep B surface antigen. Also check immunity to HAV and HBV. --Will call her with labs results. If liver chemistries still elevated then will need complete serologic workup to evaluate for other etiologies of liver disease such as autoimmune and  genetic causes. She has Sjogren's disease, known positive ANA.     # Colon cancer screening. She had a negative Cologuard on 01/09/20. No [place] of colon cancer.   # Mild Graves disease / ophthalmopathy.  She has had orbital decompression . Was able to come off methimazole  # Sjogren's syndrome on Plaquenil. Raynaud's disease. Followed by Dr. Estanislado Pandy   HISTORY OF PRESENT ILLNESS     Primary Gastroenterologist : new - Gerrit Heck, MD  Chief Complaint : Elevated liver tests.   DESTANE SPEAS is a 49 y.o. female with PMH / [place] significant for,  but not necessarily limited to: COVID Jan 2021, mild Graves disease, H.pylori infection, Sjogren's syndrome    Patient is referred by PCP for abnormal liver tests.  In August she was taking Ibuprofen, tylenol and Aleve for shoulder pain. Liver chemistries historically normal but in early Sept AST was 42 / ALT 53. Alk phos was normal.  She stopped all the OTC pain relievers and repeat labs two weeks later showed normal normal AST 31, ALT down to 43. She had a new mild elevation in alk phos at 158  At last check on 11/15//21 her AST was normal, ALT almost normal and alk phos down to 140.  Patient has no GI complaints.  Specifically, no abdominal pain, no nausea nor  vomiting.  Patient generally drinks only 1-2 glasses of wine a week She hasn't started any new medications in last two months.  No known [place] of liver disease. She has mild Graves disease and Sjogren's disease, on Plaquenil.    Past Medical History:  Diagnosis Date  . Compressive optic neuropathy   . Graves disease   . Hepatitis A   . Seasonal allergies   . Sjogren's disease ([place])    positive ANA nad Ro  . Thyroid disease   . Thyroid eye disease      Past Surgical History:  Procedure Laterality Date  . EYE SURGERY Bilateral 2017, 2018   release of optic pressure   . FUNCTIONAL ENDOSCOPIC SINUS SURGERY Bilateral 05/2015  . Orbital Decompression Bilateral 05/2015   Family History  Problem Relation Age of Onset  . Hypertension Mother   . Retinal detachment Mother   . Thyroid disease Mother   . Diabetes Father   . Thyroid disease Sister   . Other Sister        tonsil   . Heart attack Maternal Grandfather   . Breast cancer Neg Hx   . Colon cancer Neg Hx   . Stomach cancer Neg Hx   . Pancreatic cancer Neg Hx   . Esophageal cancer Neg Hx    Social History   Tobacco Use  . Smoking status: Never Smoker  . Smokeless tobacco: Never Used  Vaping Use  .  Vaping Use: Never used  Substance Use Topics  . Alcohol use: Yes    Alcohol/week: 0.0 standard drinks    Comment: weekends  . Drug use: No   Current Outpatient Medications  Medication Sig Dispense Refill  . Ascorbic Acid (VITAMIN C) 100 MG tablet Take 100 mg by mouth daily.    . Calcium Carbonate-Vit D-Min (CALCIUM 1200 PO) Take by mouth.    . Cholecalciferol (VITAMIN D3) 5000 units CAPS Take 1 capsule by mouth 2 (two) times a week.    . Estradiol 10 MCG TABS vaginal tablet Place 1 tablet (10 mcg total) vaginally 2 (two) times a week. 24 tablet 4  . hydroxychloroquine (PLAQUENIL) 200 MG tablet TAKE 1 TABLET BY MOUTH EVERY DAY 90 tablet 0  . pilocarpine (SALAGEN) 5 MG tablet TAKE 1 TABLET(5 MG) BY MOUTH THREE TIMES DAILY AS  NEEDED 270 tablet 1  . RESTASIS 0.05 % ophthalmic emulsion 1 drop 2 (two) times daily.    . Selenium 200 MCG CAPS Take 200 mcg by mouth daily.     No current facility-administered medications for this visit.   No Known Allergies   Review of Systems: Positive for night sweats insulin pulse.  All other systems reviewed and negative except where noted in HPI.   PHYSICAL EXAM :    Wt Readings from Last 3 Encounters:  03/25/20 151 lb (68.5 kg)  12/25/19 150 lb 4 oz (68.2 kg)  12/20/19 151 lb (68.5 kg)    Ht 5' (1.524 m)   Wt 151 lb (68.5 kg)   BMI 29.49 kg/m  Constitutional:  Pleasant female in no acute distress. Psychiatric: Normal mood and affect. Behavior is normal. EENT: Pupils normal.  Conjunctivae are normal. No scleral icterus. Neck supple.  Cardiovascular: Normal rate, regular rhythm. No edema Pulmonary/chest: Effort normal and breath sounds normal. No wheezing, rales or rhonchi. Abdominal: Soft, nondistended, nontender. Bowel sounds active throughout. There are no masses palpable. No hepatomegaly. Neurological: Alert and oriented to person place and time. Skin: Skin is warm and dry. No rashes noted.  Tye Savoy, NP  03/25/2020, 2:41 PM  Cc:  Referring Provider Colon Branch, MD

## 2020-03-26 LAB — HEPATITIS A ANTIBODY, TOTAL: Hepatitis A AB,Total: REACTIVE — AB

## 2020-03-26 LAB — HEPATITIS B SURFACE ANTIBODY,QUALITATIVE: Hep B S Ab: NONREACTIVE

## 2020-03-26 LAB — HEPATITIS B SURFACE ANTIGEN: Hepatitis B Surface Ag: NONREACTIVE

## 2020-03-26 LAB — HEPATITIS B CORE ANTIBODY, TOTAL: Hep B Core Total Ab: NONREACTIVE

## 2020-03-27 NOTE — Progress Notes (Signed)
Agree with the assessment and plan as outlined by Willette Cluster, NP. Agree with plan for repeating enzymes as they were downtrending/normalizing on last check after stopping likely offending agents. If normalized, no further w/u needed at this time and can perhaps repeat in 1 year. If uptrending, will plan for further serologic eval, particularly given history of AI disease. Agree with viral hep studies as outlined.   Nidya Bouyer, DO, [facility]

## 2020-04-06 ENCOUNTER — Telehealth: Payer: Self-pay

## 2020-04-06 ENCOUNTER — Other Ambulatory Visit: Payer: Self-pay

## 2020-04-06 DIAGNOSIS — R7989 Other specified abnormal findings of blood chemistry: Secondary | ICD-10-CM

## 2020-04-06 NOTE — Progress Notes (Signed)
Hi, no the AMA is anti-mitochondrial antibody. The ASMA is anti-smooth muscle antibody. Thanks

## 2020-04-06 NOTE — Telephone Encounter (Signed)
-----   Message from [name], NP sent at 04/05/2020 10:22 PM EST ----- [name], please let Myosha know that liver tests are still mildly elevated ( a little higher than before). She is immune to Hepatitis A but at some point should consider getting vaccinated for hepatitis B. HCV is negative. We need to workup the levated LFTs. Please ask her to come by for labs to include AMA, ASMA, IgG, ceruloplasmin, alpha 1 anti-trypsinogen, ferritin, TIBC, tTg and IgA. Doesn't need repeat ANA. Please no tylenol or NSAIDs for now while we are working this up. Thanks

## 2020-04-06 NOTE — Telephone Encounter (Signed)
Labs ordered and left message via interpreter: Anabelly [phone] to please call back.

## 2020-04-15 NOTE — Telephone Encounter (Signed)
Left msg on pt's vm to call back regarding her results and labs ordered.

## 2020-04-24 ENCOUNTER — Other Ambulatory Visit: Payer: 59

## 2020-04-24 DIAGNOSIS — R7989 Other specified abnormal findings of blood chemistry: Secondary | ICD-10-CM

## 2020-04-24 NOTE — Addendum Note (Signed)
Addended by: Waldemar Dickens B on: 04/24/2020 03:12 PM   Modules accepted: Orders

## 2020-04-28 LAB — CERULOPLASMIN: Ceruloplasmin: 34 mg/dL (ref 18–53)

## 2020-04-28 LAB — IRON,TIBC AND FERRITIN PANEL
%SAT: 17 % (calc) (ref 16–45)
Ferritin: 45 ng/mL (ref 16–232)
Iron: 72 ug/dL (ref 45–160)
TIBC: 430 mcg/dL (calc) (ref 250–450)

## 2020-04-28 LAB — ALPHA-1-ANTITRYPSIN: A-1 Antitrypsin, Ser: 129 mg/dL (ref 83–199)

## 2020-04-28 LAB — TTG/DGP SCREEN: tTG/DGP Screen: NEGATIVE

## 2020-04-28 LAB — IGA: Immunoglobulin A: 456 mg/dL — ABNORMAL HIGH (ref 47–310)

## 2020-04-28 LAB — MITOCHONDRIAL ANTIBODIES: Mitochondrial M2 Ab, IgG: <=20 U

## 2020-04-28 LAB — ANTI-SMOOTH MUSCLE ANTIBODY, IGG: Actin (Smooth Muscle) Antibody (IGG): <20 U (ref <20)

## 2020-05-04 ENCOUNTER — Telehealth: Payer: Self-pay | Admitting: Nurse Practitioner

## 2020-05-04 NOTE — Telephone Encounter (Signed)
Spanish speaking.

## 2020-05-04 NOTE — Telephone Encounter (Signed)
Inbound call from patient requesting a call back with lab results done on 04/24/2020 please.

## 2020-05-06 ENCOUNTER — Telehealth: Payer: Self-pay

## 2020-05-06 ENCOUNTER — Other Ambulatory Visit: Payer: Self-pay

## 2020-05-06 DIAGNOSIS — R7989 Other specified abnormal findings of blood chemistry: Secondary | ICD-10-CM

## 2020-05-06 NOTE — Telephone Encounter (Signed)
Patient called back and had several questions about her MyChart message. I answered them.

## 2020-05-06 NOTE — Telephone Encounter (Signed)
-----   Message from [name], NP sent at 05/05/2020  5:21 PM EST ----- Cynthia Fox I sent this message to patient regarding her labs in case she calls you with questions;    Hi Cynthia Fox, your labs tests are okay. So far there is not an explanation for the elevated liver tests. At this point they are just mildly elevated but if they remain high ,or get worse then you might need a liver biopsy. Lets repeat you liver tests in 6 weeks. In the meantime please do not take asa, advil, motrin, aleve or any other anti-inflammatory type medications. No vitamins or herbs. No alcohol.   Please put in order for LFTs to be done in 6 weeks from today. Thanks

## 2020-05-06 NOTE — Progress Notes (Signed)
Office Visit Note  Patient: Cynthia Fox             Date of Birth: [birth year]           MRN: [id number]             PCP: Cynthia Plump, MD Referring: Cynthia Plump, MD Visit Date: 05/20/2020 Occupation: @GUAROCC @  Subjective:  Dry mouth and dry eyes.   History of Present Illness: Cynthia Fox is a 50 y.o. female with history of Sjogren's.  She states she continues to have dry mouth and dry eyes.  She has been using Restasis and over-the-counter products.  She does not think that pilocarpine is very effective.  She has been tolerating Plaquenil well.  She was recently evaluated by gastroenterologist for elevated LFTs.  She was advised to stop several of her supplements to see if her LFTs normalized.  She will have follow-up labs after that time.  She states the Raynauds symptoms bother her with the weather change only.  She denies any history of digital ulcers.  Activities of Daily Living:  Patient reports morning stiffness for 0 minutes.   Patient Denies nocturnal pain.  Difficulty dressing/grooming: Denies Difficulty climbing stairs: Denies Difficulty getting out of chair: Denies Difficulty using hands for taps, buttons, cutlery, and/or writing: Denies  Review of Systems  Constitutional: Negative for fatigue.  HENT: Positive for mouth dryness and nose dryness. Negative for mouth sores.   Eyes: Positive for pain, itching and dryness.  Respiratory: Negative for shortness of breath and difficulty breathing.   Cardiovascular: Negative for chest pain and palpitations.  Gastrointestinal: Negative for blood in stool, constipation and diarrhea.  Endocrine: Negative for increased urination.  Genitourinary: Negative for difficulty urinating.  Musculoskeletal: Negative for arthralgias, joint pain, joint swelling, myalgias, morning stiffness, muscle tenderness and myalgias.  Skin: Positive for color change. Negative for rash and redness.  Allergic/Immunologic: Negative for susceptible to  infections.  Neurological: Positive for numbness. Negative for dizziness, headaches, memory loss and weakness.  Hematological: Negative for bruising/bleeding tendency.  Psychiatric/Behavioral: Negative for confusion.    PMFS History:  Patient Active Problem List   Diagnosis Date Noted  . H. pylori infection, treated 06/2018 09/08/2018  . 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 Annual physical exam 10/24/2016  . Menopause 10/13/2016  . H/O seasonal allergies 03/23/2016  . Vitamin D deficiency 03/23/2016  . High risk medication use 03/22/2016  . Sjogren's syndrome (HCC) 06/25/2015  . PCP NOTES >>>>>>>>>>>>>>>>>>>>>> 06/25/2015  . Graves disease 06/01/2015  . Hyperprolactinemia (HCC) 01/12/2015  . Elevated antinuclear antibody (ANA) level 07/23/2014    Past Medical History:  Diagnosis Date  . Compressive optic neuropathy   . Graves disease   . Hepatitis A   . Seasonal allergies   . Sjogren's disease (HCC)    positive ANA nad Ro  . Thyroid disease   . Thyroid eye disease     Family History  Problem Relation Age of Onset  . Hypertension Mother   . Retinal detachment Mother   . Thyroid disease Mother   . Diabetes Father   . Thyroid disease Sister   . Other Sister        tonsil   . Heart attack Maternal Grandfather   . Breast cancer Neg Hx   . Colon cancer Neg Hx   . Stomach cancer Neg Hx   . Pancreatic cancer Neg Hx   . Esophageal cancer Neg Hx    Past Surgical History:  Procedure Laterality  Date  . EYE SURGERY Bilateral 2017, 2018   release of optic pressure   . FUNCTIONAL ENDOSCOPIC SINUS SURGERY Bilateral 05/2015  . Orbital Decompression Bilateral 05/2015   Social History   Social History Narrative   Original from [employer]    Lives w/ husband   Immunization History  Administered Date(s) Administered  . Influenza,inj,Quad PF,6+ Mos 12/24/2018  . Influenza-Unspecified 12/19/2019  . PFIZER(Purple Top)SARS-COV-2 Vaccination 07/04/2019, 07/25/2019, 03/10/2020  . Tdap  07/30/2012     Objective: Vital Signs: BP 120/83 (BP Location: Left Arm, Patient Position: Sitting, Cuff Size: Normal)   Pulse 82   Resp 14   Ht 5' (1.524 m)   Wt 154 lb 6.4 oz (70 kg)   BMI 30.15 kg/m    Physical Exam Vitals and nursing note reviewed.  Constitutional:      Appearance: She is well-developed and well-nourished.  HENT:     Head: Normocephalic and atraumatic.  Eyes:     Extraocular Movements: EOM normal.     Conjunctiva/sclera: Conjunctivae normal.  Cardiovascular:     Rate and Rhythm: Normal rate and regular rhythm.     Pulses: Intact distal pulses.     Heart sounds: Normal heart sounds.  Pulmonary:     Effort: Pulmonary effort is normal.     Breath sounds: Normal breath sounds.  Abdominal:     General: Bowel sounds are normal.     Palpations: Abdomen is soft.  Musculoskeletal:     Cervical back: Normal range of motion.  Lymphadenopathy:     Cervical: No cervical adenopathy.  Skin:    General: Skin is warm and dry.     Capillary Refill: Capillary refill takes less than 2 seconds.  Neurological:     Mental Status: She is alert and oriented to person, place, and time.  Psychiatric:        Mood and Affect: Mood and affect normal.        Behavior: Behavior normal.      Musculoskeletal Exam: C-spine thoracic lumbar spine with good range of motion.  Shoulder joints, elbow joints, wrist joints, MCPs PIPs and DIPs with good range of motion with no synovitis.  Hip joints, knee joints, ankles, MTPs and PIPs with good range of motion with no synovitis.  CDAI Exam: CDAI Score: - Patient Global: -; Provider Global: - Swollen: -; Tender: - Joint Exam 05/20/2020   No joint exam has been documented for this visit   There is currently no information documented on the homunculus. Go to the Rheumatology activity and complete the homunculus joint exam.  Investigation: No additional findings.  Imaging: No results found.  Recent Labs: Lab Results  Component  Value Date   WBC 6.8 12/12/2019   HGB 12.7 12/12/2019   PLT 264 12/12/2019   NA 140 12/12/2019   K 3.9 12/12/2019   CL 105 12/12/2019   CO2 26 12/12/2019   GLUCOSE 99 12/12/2019   BUN 12 12/12/2019   CREATININE 0.60 12/12/2019   BILITOT 1.1 03/25/2020   ALKPHOS 147 (H) 03/25/2020   AST 32 03/25/2020   ALT 50 (H) 03/25/2020   PROT 7.8 03/25/2020   ALBUMIN 4.6 03/25/2020   CALCIUM 9.1 12/12/2019   GFRAA 122 12/12/2019    Speciality Comments: PLQ Eye Exam: 12/31/2019 WNL @ [facility] Follow up in 1 year  Procedures:  No procedures performed Allergies: Patient has no known allergies.   Assessment / Plan:     Visit Diagnoses: Sjogren's syndrome with keratoconjunctivitis sicca (  HCC) - Positive ANA, positive Ro, positive rheumatoid factor, negative CCP: She continues to have sicca symptoms.  She has not noticed much improvement with pilocarpine.  She states she forgets to take the medication frequently.  She has been taking Plaquenil 1 tablet a day.  Pilocarpine 5 mg p.o. 3 times daily as needed.  I will obtain labs today.  High risk medication use - Plaquenil 200 mg 1 tablet by mouth daily. PLQ Eye Exam: December 27, 2019.  Her labs from March 25, 2020 showed elevated LFTs  Raynaud's disease without gangrene -she has taken Norvasc in the past for Raynauds during the winter months.  This year her symptoms are not as prominent.  Elevated LFTs-she is followed by gastroenterology currently.  History of vitamin D deficiency  Primary insomnia-good sleep hygiene was discussed.  History of Graves' disease  She is fully vaccinated against COVID-19 and also had a booster.  Use of mask, social distancing and hand hygiene was discussed.  Orders: Orders Placed This Encounter  Procedures  . CBC with Differential/Platelet  . COMPLETE METABOLIC PANEL WITH GFR  . Urinalysis, Routine w reflex microscopic  . Sedimentation rate  . Anti-DNA antibody, double-stranded  . Sjogrens  syndrome-A extractable nuclear antibody   No orders of the defined types were placed in this encounter.     Follow-Up Instructions: Return in about 4 months (around 09/17/2020) for Sjogren's.   Pollyann Savoy, MD  Note - This record has been created using Animal nutritionist.  Chart creation errors have been sought, but may not always  have been located. Such creation errors do not reflect on  the standard of medical care.

## 2020-05-06 NOTE — Telephone Encounter (Signed)
Called patient and left message via interpreter: Cynthia Fox [phone] to please call back, attempting to give lab results and recommendations from Willette Cluster NP. Order in for repeat LFTs and staff reminder to call pt.

## 2020-05-20 ENCOUNTER — Encounter: Payer: Self-pay | Admitting: Rheumatology

## 2020-05-20 ENCOUNTER — Ambulatory Visit: Payer: 59 | Admitting: Rheumatology

## 2020-05-20 ENCOUNTER — Other Ambulatory Visit: Payer: Self-pay

## 2020-05-20 VITALS — BP 120/83 | HR 82 | Resp 14 | Ht 60.0 in | Wt 154.4 lb

## 2020-05-20 DIAGNOSIS — I7300 Raynaud's syndrome without gangrene: Secondary | ICD-10-CM | POA: Diagnosis not present

## 2020-05-20 DIAGNOSIS — M25511 Pain in right shoulder: Secondary | ICD-10-CM

## 2020-05-20 DIAGNOSIS — F5101 Primary insomnia: Secondary | ICD-10-CM

## 2020-05-20 DIAGNOSIS — R7989 Other specified abnormal findings of blood chemistry: Secondary | ICD-10-CM

## 2020-05-20 DIAGNOSIS — M545 Low back pain, unspecified: Secondary | ICD-10-CM

## 2020-05-20 DIAGNOSIS — M3501 Sicca syndrome with keratoconjunctivitis: Secondary | ICD-10-CM | POA: Diagnosis not present

## 2020-05-20 DIAGNOSIS — Z79899 Other long term (current) drug therapy: Secondary | ICD-10-CM

## 2020-05-20 DIAGNOSIS — Z8639 Personal history of other endocrine, nutritional and metabolic disease: Secondary | ICD-10-CM

## 2020-05-21 LAB — URINALYSIS, ROUTINE W REFLEX MICROSCOPIC
Bilirubin Urine: NEGATIVE
Glucose, UA: NEGATIVE
Hgb urine dipstick: NEGATIVE
Ketones, ur: NEGATIVE
Leukocytes,Ua: NEGATIVE
Nitrite: NEGATIVE
Protein, ur: NEGATIVE
Specific Gravity, Urine: 1.007 (ref 1.001–1.03)
pH: >=8.5 — AB (ref 5.0–8.0)

## 2020-05-21 LAB — COMPLETE METABOLIC PANEL WITH GFR
AG Ratio: 1.6 (calc) (ref 1.0–2.5)
ALT: 46 U/L — ABNORMAL HIGH (ref 6–29)
AST: 40 U/L — ABNORMAL HIGH (ref 10–35)
Albumin: 4.8 g/dL (ref 3.6–5.1)
Alkaline phosphatase (APISO): 143 U/L (ref 37–153)
BUN: 11 mg/dL (ref 7–25)
CO2: 31 mmol/L (ref 20–32)
Calcium: 9.7 mg/dL (ref 8.6–10.4)
Chloride: 102 mmol/L (ref 98–110)
Creat: 0.57 mg/dL (ref 0.50–1.05)
GFR, Est African American: 124 mL/min/{1.73_m2} (ref >=60)
GFR, Est Non African American: 107 mL/min/{1.73_m2} (ref >=60)
Globulin: 3 g/dL (calc) (ref 1.9–3.7)
Glucose, Bld: 90 mg/dL (ref 65–99)
Potassium: 4 mmol/L (ref 3.5–5.3)
Sodium: 140 mmol/L (ref 135–146)
Total Bilirubin: 0.9 mg/dL (ref 0.2–1.2)
Total Protein: 7.8 g/dL (ref 6.1–8.1)

## 2020-05-21 LAB — CBC WITH DIFFERENTIAL/PLATELET
Absolute Monocytes: 508 cells/uL (ref 200–950)
Basophils Absolute: 81 cells/uL (ref 0–200)
Basophils Relative: 1.3 %
Eosinophils Absolute: 763 cells/uL — ABNORMAL HIGH (ref 15–500)
Eosinophils Relative: 12.3 %
HCT: 40 % (ref 35.0–45.0)
Hemoglobin: 13.8 g/dL (ref 11.7–15.5)
Lymphs Abs: 2356 cells/uL (ref 850–3900)
MCH: 34.1 pg — ABNORMAL HIGH (ref 27.0–33.0)
MCHC: 34.5 g/dL (ref 32.0–36.0)
MCV: 98.8 fL (ref 80.0–100.0)
MPV: 12.5 fL (ref 7.5–12.5)
Monocytes Relative: 8.2 %
Neutro Abs: 2492 cells/uL (ref 1500–7800)
Neutrophils Relative %: 40.2 %
Platelets: 242 10*3/uL (ref 140–400)
RBC: 4.05 10*6/uL (ref 3.80–5.10)
RDW: 12.3 % (ref 11.0–15.0)
Total Lymphocyte: 38 %
WBC: 6.2 10*3/uL (ref 3.8–10.8)

## 2020-05-21 LAB — ANTI-DNA ANTIBODY, DOUBLE-STRANDED: ds DNA Ab: <1 IU/mL

## 2020-05-21 LAB — SJOGRENS SYNDROME-A EXTRACTABLE NUCLEAR ANTIBODY: SSA (Ro) (ENA) Antibody, IgG: >8 AI — AB

## 2020-05-21 LAB — SEDIMENTATION RATE: Sed Rate: 17 mm/h (ref 0–30)

## 2020-05-22 NOTE — Progress Notes (Signed)
Eosinophils are elevated most likely due to allergies.  LFTs are elevated.  Patient is followed by gastroenterologist.  Ro antibody is a still positive, dsDNA is negative.  Sed rate is normal.

## 2020-06-08 ENCOUNTER — Other Ambulatory Visit: Payer: Self-pay | Admitting: *Deleted

## 2020-06-08 DIAGNOSIS — M3501 Sicca syndrome with keratoconjunctivitis: Secondary | ICD-10-CM

## 2020-06-08 MED ORDER — HYDROXYCHLOROQUINE SULFATE 200 MG PO TABS: ORAL_TABLET | ORAL | 0 refills | Status: DC

## 2020-06-08 NOTE — Telephone Encounter (Addendum)
RX faxed from [facility] [address]  Last Visit: 05/20/2020 Next Visit: 09/16/2020 Labs: 05/20/2020, Eosinophils are elevated most likely due to allergies. LFTs are elevated. Patient is followed by gastroenterologist. Ro antibody is a still positive, dsDNA is negative. Sed rate is normal. Eye exam: 12/31/2019 WNL  Current Dose per office note 05/20/2020, Plaquenil 200 mg 1 tablet by mouth daily DX: Sjogren's syndrome with keratoconjunctivitis sicca   Last Fill: 03/04/2020  Okay to refill Plaquenil?

## 2020-06-11 ENCOUNTER — Other Ambulatory Visit (INDEPENDENT_AMBULATORY_CARE_PROVIDER_SITE_OTHER): Payer: 59

## 2020-06-11 DIAGNOSIS — R7989 Other specified abnormal findings of blood chemistry: Secondary | ICD-10-CM | POA: Diagnosis not present

## 2020-06-11 LAB — HEPATIC FUNCTION PANEL
ALT: 37 U/L — ABNORMAL HIGH (ref 0–35)
AST: 33 U/L (ref 0–37)
Albumin: 4.3 g/dL (ref 3.5–5.2)
Alkaline Phosphatase: 129 U/L — ABNORMAL HIGH (ref 39–117)
Bilirubin, Direct: 0.1 mg/dL (ref 0.0–0.3)
Total Bilirubin: 0.8 mg/dL (ref 0.2–1.2)
Total Protein: 7.4 g/dL (ref 6.0–8.3)

## 2020-06-17 ENCOUNTER — Telehealth: Payer: Self-pay | Admitting: Nurse Practitioner

## 2020-06-17 NOTE — Telephone Encounter (Signed)
Inbound call from patient requesting a call back from a nurse please to discuss her lab results.

## 2020-06-17 NOTE — Telephone Encounter (Signed)
Cynthia Fox, please review and advise on the lab results

## 2020-07-29 ENCOUNTER — Other Ambulatory Visit: Payer: Self-pay | Admitting: *Deleted

## 2020-07-29 DIAGNOSIS — M3501 Sicca syndrome with keratoconjunctivitis: Secondary | ICD-10-CM

## 2020-07-29 MED ORDER — PILOCARPINE HCL 5 MG PO TABS: ORAL_TABLET | ORAL | 0 refills | Status: DC

## 2020-07-29 NOTE — Telephone Encounter (Signed)
RX faxed from [facility]  Next Visit: 09/16/2020  Last Visit: 05/20/2020  Last Fill: 07/11/2019  Dx: Sjogren's syndrome with keratoconjunctivitis sicca   Current Dose per office note on 05/20/2020, Pilocarpine 5 mg p.o. 3 times daily as needed  Okay to refill Pilocarpine?

## 2020-08-07 ENCOUNTER — Encounter: Payer: PRIVATE HEALTH INSURANCE | Admitting: Obstetrics & Gynecology

## 2020-09-02 NOTE — Progress Notes (Signed)
Office Visit Note  Patient: Cynthia Fox             Date of Birth: 1971-01-23           MRN: [id number]             PCP: Wanda Plump, MD Referring: Wanda Plump, MD Visit Date: 09/16/2020 Occupation: @GUAROCC @  Subjective:  Other (Patient reports recent pain and swelling in right ankle/leg )   History of Present Illness: Cynthia Fox is a 50 y.o. female with history of Sjogren's.  She continues to have dry eyes and dry mouth symptoms.  She continues to take Plaquenil and pilocarpine.  She has been using eyedrops.  She has been having allergies and using Zyrtec for that.  She states she has been walking on a regular basis recently.  She notices some discomfort in the top of her foot.  She states about a week ago she tripped and twisted her right ankle which was painful.  Epson salt soaks helped and the symptoms have resolved now.  None of the other joints are painful.  She was evaluated by gastroenterology in December 2021 for elevated LFTs.  The work-up was negative.  She was advised to come back for a follow-up visit in 6 months.  Activities of Daily Living:  Patient reports morning stiffness for 0 minutes.   Patient Denies nocturnal pain.  Difficulty dressing/grooming: Denies Difficulty climbing stairs: Denies Difficulty getting out of chair: Denies Difficulty using hands for taps, buttons, cutlery, and/or writing: Denies  Review of Systems  Constitutional: Negative for fatigue.  HENT: Positive for mouth dryness. Negative for mouth sores and nose dryness.   Eyes: Positive for redness and dryness. Negative for pain and itching.  Respiratory: Negative for shortness of breath and difficulty breathing.   Cardiovascular: Negative for chest pain and palpitations.  Gastrointestinal: Negative for blood in stool, constipation and diarrhea.  Endocrine: Negative for increased urination.  Genitourinary: Negative for difficulty urinating.  Musculoskeletal: Positive for arthralgias and  joint pain. Negative for joint swelling, myalgias, morning stiffness, muscle tenderness and myalgias.  Skin: Positive for color change. Negative for rash and redness.  Allergic/Immunologic: Negative for susceptible to infections.  Neurological: Negative for dizziness, numbness, headaches, memory loss and weakness.  Hematological: Negative for bruising/bleeding tendency.  Psychiatric/Behavioral: Negative for confusion.    PMFS History:  Patient Active Problem List   Diagnosis Date Noted  . H. pylori infection, treated 06/2018 09/08/2018  . 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 Annual physical exam 10/24/2016  . Menopause 10/13/2016  . H/O seasonal allergies 03/23/2016  . Vitamin D deficiency 03/23/2016  . High risk medication use 03/22/2016  . Sjogren's syndrome (HCC) 06/25/2015  . PCP NOTES >>>>>>>>>>>>>>>>>>>>>> 06/25/2015  . Graves disease 06/01/2015  . Hyperprolactinemia (HCC) 01/12/2015  . Elevated antinuclear antibody (ANA) level 07/23/2014    Past Medical History:  Diagnosis Date  . Compressive optic neuropathy   . Graves disease   . Hepatitis A   . Seasonal allergies   . Sjogren's disease (HCC)    positive ANA nad Ro  . Thyroid disease   . Thyroid eye disease     Family History  Problem Relation Age of Onset  . Hypertension Mother   . Retinal detachment Mother   . Thyroid disease Mother   . Diabetes Father   . Thyroid disease Sister   . Other Sister        tonsil   . Heart attack Maternal Grandfather   .  Breast cancer Neg Hx   . Colon cancer Neg Hx   . Stomach cancer Neg Hx   . Pancreatic cancer Neg Hx   . Esophageal cancer Neg Hx    Past Surgical History:  Procedure Laterality Date  . EYE SURGERY Bilateral 2017, 2018   release of optic pressure   . FUNCTIONAL ENDOSCOPIC SINUS SURGERY Bilateral 05/2015  . Orbital Decompression Bilateral 05/2015   Social History   Social History Narrative   Original from [employer]    Lives w/ husband   Immunization History   Administered Date(s) Administered  . Influenza,inj,Quad PF,6+ Mos 12/24/2018  . Influenza-Unspecified 12/19/2019  . PFIZER(Purple Top)SARS-COV-2 Vaccination 07/04/2019, 07/25/2019, 03/10/2020  . Tdap 07/30/2012     Objective: Vital Signs: BP 114/77 (BP Location: Left Arm, Patient Position: Sitting, Cuff Size: Normal)   Pulse 82   Resp 14   Ht 5' (1.524 m)   Wt 160 lb 3.2 oz (72.7 kg)   BMI 31.29 kg/m    Physical Exam Vitals and nursing note reviewed.  Constitutional:      Appearance: She is well-developed.  HENT:     Head: Normocephalic and atraumatic.  Eyes:     Conjunctiva/sclera: Conjunctivae normal.     Comments: Right eye pterygium noted.  Cardiovascular:     Rate and Rhythm: Normal rate and regular rhythm.     Heart sounds: Normal heart sounds.  Pulmonary:     Effort: Pulmonary effort is normal.     Breath sounds: Normal breath sounds.  Abdominal:     General: Bowel sounds are normal.     Palpations: Abdomen is soft.  Musculoskeletal:     Cervical back: Normal range of motion.  Lymphadenopathy:     Cervical: No cervical adenopathy.  Skin:    General: Skin is warm and dry.     Capillary Refill: Capillary refill takes less than 2 seconds.     Comments: No sclerodactyly, nailbed capillary changes were noted.  No telangiectasias were noted.  Neurological:     Mental Status: She is alert and oriented to person, place, and time.  Psychiatric:        Behavior: Behavior normal.      Musculoskeletal Exam: C-spine thoracic and lumbar spine with good range of motion.  Shoulder joints, elbow joints, wrist joints, MCPs PIPs and DIPs with good range of motion with no synovitis.  Hip joints, knee joints, ankles, MTPs and PIPs with good range of motion with no synovitis.  CDAI Exam: CDAI Score: -- Patient Global: --; Provider Global: -- Swollen: --; Tender: -- Joint Exam 09/16/2020   No joint exam has been documented for this visit   There is currently no  information documented on the homunculus. Go to the Rheumatology activity and complete the homunculus joint exam.  Investigation: No additional findings.  Imaging: No results found.  Recent Labs: Lab Results  Component Value Date   WBC 6.2 05/20/2020   HGB 13.8 05/20/2020   PLT 242 05/20/2020   NA 140 05/20/2020   K 4.0 05/20/2020   CL 102 05/20/2020   CO2 31 05/20/2020   GLUCOSE 90 05/20/2020   BUN 11 05/20/2020   CREATININE 0.57 05/20/2020   BILITOT 0.8 06/11/2020   ALKPHOS 129 (H) 06/11/2020   AST 33 06/11/2020   ALT 37 (H) 06/11/2020   PROT 7.4 06/11/2020   ALBUMIN 4.3 06/11/2020   CALCIUM 9.7 05/20/2020   GFRAA 124 05/20/2020    Speciality Comments: PLQ Eye Exam: 12/31/2019 WNL @  [facility] Follow up in 1 year  Procedures:  No procedures performed Allergies: Patient has no known allergies.   Assessment / Plan:     Visit Diagnoses: Sjogren's syndrome with keratoconjunctivitis sicca (HCC) - Positive ANA, positive Ro, positive rheumatoid factor, negative CCP: Pilocarpine 5 mg p.o. 3 times daily as needed.  Patient continues to have some sicca symptoms.  She states symptoms are better controlled with pilocarpine and Plaquenil.  She has been using over-the-counter products.  Some over-the-counter products were discussed.  She has been having eye irritation from allergies as well.  Increased risk of lymphoma with Sjogren's was also discussed.- Plan: Urinalysis, Routine w reflex microscopic, Sedimentation rate, C3 and C4, Anti-DNA antibody, double-stranded, Rheumatoid factor, Serum protein electrophoresis with reflex, Sjogrens syndrome-A extractable nuclear antibody  High risk medication use - Plaquenil 200 mg 1 tablet by mouth daily. PLQ Eye Exam: December 27, 2019. - Plan: CBC with Differential/Platelet, COMPLETE METABOLIC PANEL WITH GFR, today and then prior to the next visit.  CBC with Differential/Platelet, COMPLETE METABOLIC PANEL WITH GFR.  Updated information  regarding immunization was provided.  Handout was placed in the AVS.  She has been advised to stop immunosuppressive therapy in case she develops a severe infection.  Raynaud's disease without gangrene - she has taken Norvasc in the past for Raynauds during the winter months.  Her symptoms are not active currently.  Injury of right ankle, initial encounter-patient had an injury to her right ankle about a week ago with some pain and discomfort.  Symptoms improved after using Epsom salt soaks.  She had no tenderness or swelling on examination today.  Elevated LFTs - she had GI evaluation.  She was advised to return for follow-up in 6 months.  Her LFTs improved over time.  GI records were reviewed.  History of vitamin D deficiency -she stopped vitamin D.  She wants to know her vitamin D level today.  Plan: VITAMIN D 25 Hydroxy (Vit-D Deficiency, Fractures)  Primary insomnia-good sleep hygiene was discussed.  History of Graves' disease  Orders: Orders Placed This Encounter  Procedures  . CBC with Differential/Platelet  . COMPLETE METABOLIC PANEL WITH GFR  . CBC with Differential/Platelet  . COMPLETE METABOLIC PANEL WITH GFR  . Urinalysis, Routine w reflex microscopic  . Sedimentation rate  . C3 and C4  . Anti-DNA antibody, double-stranded  . Rheumatoid factor  . Serum protein electrophoresis with reflex  . Sjogrens syndrome-A extractable nuclear antibody  . VITAMIN D 25 Hydroxy (Vit-D Deficiency, Fractures)   No orders of the defined types were placed in this encounter.     Follow-Up Instructions: Return in about 5 months (around 02/16/2021) for Sjogren's.   Pollyann Savoy, MD  Note - This record has been created using Animal nutritionist.  Chart creation errors have been sought, but may not always  have been located. Such creation errors do not reflect on  the standard of medical care.

## 2020-09-16 ENCOUNTER — Encounter: Payer: Self-pay | Admitting: Rheumatology

## 2020-09-16 ENCOUNTER — Other Ambulatory Visit: Payer: Self-pay

## 2020-09-16 ENCOUNTER — Ambulatory Visit: Payer: 59 | Admitting: Rheumatology

## 2020-09-16 VITALS — BP 114/77 | HR 82 | Resp 14 | Ht 60.0 in | Wt 160.2 lb

## 2020-09-16 DIAGNOSIS — Z79899 Other long term (current) drug therapy: Secondary | ICD-10-CM

## 2020-09-16 DIAGNOSIS — M3501 Sicca syndrome with keratoconjunctivitis: Secondary | ICD-10-CM

## 2020-09-16 DIAGNOSIS — S99911A Unspecified injury of right ankle, initial encounter: Secondary | ICD-10-CM

## 2020-09-16 DIAGNOSIS — Z8639 Personal history of other endocrine, nutritional and metabolic disease: Secondary | ICD-10-CM

## 2020-09-16 DIAGNOSIS — I7300 Raynaud's syndrome without gangrene: Secondary | ICD-10-CM

## 2020-09-16 DIAGNOSIS — R7989 Other specified abnormal findings of blood chemistry: Secondary | ICD-10-CM

## 2020-09-16 DIAGNOSIS — F5101 Primary insomnia: Secondary | ICD-10-CM

## 2020-09-16 NOTE — Patient Instructions (Signed)
Standing Labs We placed an order today for your standing lab work.   Please have your standing labs drawn in November  If possible, please have your labs drawn 2 weeks prior to your appointment so that the provider can discuss your results at your appointment.  Please note that you may see your imaging and lab results in MyChart before we have reviewed them. We may be awaiting multiple results to interpret others before contacting you. Please allow our office up to 72 hours to thoroughly review all of the results before contacting the office for clarification of your results.  We have open lab daily: Monday through Thursday from 1:30-4:30 PM and Friday from 1:30-4:00 PM at the office of Dr. Pollyann Savoy, [facility].   Please be advised, all patients with office appointments requiring lab work will take precedent over walk-in lab work.  If possible, please come for your lab work on Monday and Friday afternoons, as you may experience shorter wait times. The office is located at [address], [place], [place] [address] No appointment is necessary.   Labs are drawn by [facility]. Please bring your co-pay at the time of your lab draw.  You may receive a bill from [facility] for your lab work.  If you wish to have your labs drawn at another location, please call the office 24 hours in advance to send orders.  If you have any questions regarding directions or hours of operation,  please call [phone].   As a reminder, please drink plenty of water prior to coming for your lab work. Thanks!  Vaccines You are taking a medication(s) that can suppress your immune system.  The following immunizations are recommended: . Flu annually . Covid-19  . Td/Tdap (tetanus, diphtheria, pertussis) every 10 years . Pneumonia (Prevnar 15 then Pneumovax 23 at least 1 year apart.  Alternatively, can take Prevnar 20 without needing additional dose) . Shingrix (after age 52): 2 doses from 4  weeks to 6 months apart  Please check with your PCP to make sure you are up to date.  If you have signs or symptoms of an infection or start antibiotics: . First, call your PCP for workup of your infection. . Hold your medication through the infection, until you complete your antibiotics, and until symptoms resolve if you take the following: o Injectable medication (Actemra, Benlysta, Cimzia, Cosentyx, Enbrel, Humira, Kevzara, Orencia, Remicade, Simponi, Stelara, Taltz, Tremfya) o Methotrexate o Leflunomide (Arava) o Mycophenolate (Cellcept) o Harriette Ohara, Olumiant, or Rinvoq  Heart Disease Prevention   Your inflammatory disease increases your risk of heart disease which includes heart attack, stroke, atrial fibrillation (irregular heartbeats), high blood pressure, heart failure and atherosclerosis (plaque in the arteries).  It is important to reduce your risk by:   . Keep blood pressure, cholesterol, and blood sugar at healthy levels   . Smoking Cessation   . Maintain a healthy weight  o BMI 20-25   . Eat a healthy diet  o Plenty of fresh fruit, vegetables, and whole grains  o Limit saturated fats, foods high in sodium, and added sugars  o DASH and Mediterranean diet   . Increase physical activity  o Recommend moderate physically activity for 150 minutes per week/ 30 minutes a day for five days a week These can be broken up into three separate ten-minute sessions during the day.   . Reduce Stress  . Meditation, slow breathing exercises, yoga, coloring books  . Dental visits twice a year

## 2020-09-17 LAB — COMPLETE METABOLIC PANEL WITH GFR
AG Ratio: 1.5 (calc) (ref 1.0–2.5)
ALT: 28 U/L (ref 6–29)
AST: 25 U/L (ref 10–35)
Albumin: 4.3 g/dL (ref 3.6–5.1)
Alkaline phosphatase (APISO): 137 U/L (ref 37–153)
BUN: 15 mg/dL (ref 7–25)
CO2: 30 mmol/L (ref 20–32)
Calcium: 9.2 mg/dL (ref 8.6–10.4)
Chloride: 104 mmol/L (ref 98–110)
Creat: 0.67 mg/dL (ref 0.50–1.05)
GFR, Est African American: 117 mL/min/{1.73_m2} (ref >=60)
GFR, Est Non African American: 101 mL/min/{1.73_m2} (ref >=60)
Globulin: 2.9 g/dL (calc) (ref 1.9–3.7)
Glucose, Bld: 99 mg/dL (ref 65–99)
Potassium: 4.5 mmol/L (ref 3.5–5.3)
Sodium: 139 mmol/L (ref 135–146)
Total Bilirubin: 0.7 mg/dL (ref 0.2–1.2)
Total Protein: 7.2 g/dL (ref 6.1–8.1)

## 2020-09-17 LAB — CBC WITH DIFFERENTIAL/PLATELET
Absolute Monocytes: 519 cells/uL (ref 200–950)
Basophils Absolute: 57 cells/uL (ref 0–200)
Basophils Relative: 1 %
Eosinophils Absolute: 695 cells/uL — ABNORMAL HIGH (ref 15–500)
Eosinophils Relative: 12.2 %
HCT: 38.8 % (ref 35.0–45.0)
Hemoglobin: 12.9 g/dL (ref 11.7–15.5)
Lymphs Abs: 1841 cells/uL (ref 850–3900)
MCH: 33.3 pg — ABNORMAL HIGH (ref 27.0–33.0)
MCHC: 33.2 g/dL (ref 32.0–36.0)
MCV: 100.3 fL — ABNORMAL HIGH (ref 80.0–100.0)
MPV: 11.7 fL (ref 7.5–12.5)
Monocytes Relative: 9.1 %
Neutro Abs: 2588 cells/uL (ref 1500–7800)
Neutrophils Relative %: 45.4 %
Platelets: 264 10*3/uL (ref 140–400)
RBC: 3.87 10*6/uL (ref 3.80–5.10)
RDW: 12.6 % (ref 11.0–15.0)
Total Lymphocyte: 32.3 %
WBC: 5.7 10*3/uL (ref 3.8–10.8)

## 2020-09-17 LAB — VITAMIN D 25 HYDROXY (VIT D DEFICIENCY, FRACTURES): Vit D, 25-Hydroxy: 31 ng/mL (ref 30–100)

## 2020-11-11 ENCOUNTER — Ambulatory Visit: Payer: Self-pay | Admitting: Obstetrics & Gynecology

## 2020-11-24 ENCOUNTER — Ambulatory Visit: Payer: Self-pay | Admitting: Obstetrics & Gynecology

## 2020-11-24 DIAGNOSIS — Z0289 Encounter for other administrative examinations: Secondary | ICD-10-CM

## 2020-12-08 ENCOUNTER — Other Ambulatory Visit: Payer: Self-pay | Admitting: *Deleted

## 2020-12-08 DIAGNOSIS — M3501 Sicca syndrome with keratoconjunctivitis: Secondary | ICD-10-CM

## 2020-12-08 MED ORDER — HYDROXYCHLOROQUINE SULFATE 200 MG PO TABS: ORAL_TABLET | ORAL | 0 refills | Status: DC

## 2020-12-08 NOTE — Telephone Encounter (Signed)
Refill request received via fax  Next Visit: 02/17/2021  Last Visit: 09/16/2020  Labs: 09/16/2020 CBC stable. CMP WNL. Vitamin D is WNL.   Eye exam: 12/31/2019 WNL    Current Dose per office note 09/16/2020: Plaquenil 200 mg 1 tablet by mouth daily  AL:[id number] syndrome with keratoconjunctivitis sicca   Last Fill: 06/08/2020  Okay to refill Plaquenil?

## 2020-12-22 ENCOUNTER — Other Ambulatory Visit: Payer: Self-pay

## 2020-12-22 ENCOUNTER — Ambulatory Visit (INDEPENDENT_AMBULATORY_CARE_PROVIDER_SITE_OTHER): Payer: 59 | Admitting: Internal Medicine

## 2020-12-22 ENCOUNTER — Encounter: Payer: Self-pay | Admitting: Internal Medicine

## 2020-12-22 VITALS — BP 120/72 | HR 81 | Ht 61.0 in | Wt 151.6 lb

## 2020-12-22 DIAGNOSIS — E0500 Thyrotoxicosis with diffuse goiter without thyrotoxic crisis or storm: Secondary | ICD-10-CM

## 2020-12-22 DIAGNOSIS — E221 Hyperprolactinemia: Secondary | ICD-10-CM

## 2020-12-22 LAB — T4, FREE: Free T4: 0.74 ng/dL (ref 0.60–1.60)

## 2020-12-22 LAB — TSH: TSH: 2.85 u[IU]/mL (ref 0.35–5.50)

## 2020-12-22 LAB — T3, FREE: T3, Free: 3.6 pg/mL (ref 2.3–4.2)

## 2020-12-22 LAB — PROLACTIN: Prolactin: 9.8 ng/mL

## 2020-12-22 NOTE — Progress Notes (Signed)
Patient ID: Cynthia Fox, female   DOB: [birth year], 50 y.o.   MRN: [id number]  This visit occurred during the SARS-CoV-2 public health emergency.  Safety protocols were in place, including screening questions prior to the visit, additional usage of staff PPE, and extensive cleaning of exam room while observing appropriate contact time as indicated for disinfecting solutions.   HPI  Cynthia Fox is a 50 y.o.-year-old female, initially referred by Dr Lily Peer, returning for Graves ds, Graves ophthalmopathy, and elevated prolactin. Last visit 1 year ago.  Interim history: She lost 10 pounds after adjusting her diet since last OV after she was found to have elevated LFTs.  She now gained few back. She has no tremors, palpitations, heat intolerance. She continues to have dry mouth and dry eyes.  She is on hydroxychloroquine and pilocarpine for her Sjogren's syndrome.  Graves ds:  Reviewed history: Pt had a thyroid uptake in 08/08/2014 (unfortunately, a scan was not done at that time) and this returned at ULN, 28.4% (10-30%).  We started MMI and the dose was increased to 10 mg in am and 5 mg in pm >> TSH high  (as she did not return for recheck as advised) >> we stopped MMi >> TSH 0.09 >> advised her to restart MMi 5 mg daily >> dose decreased to 2.5 mg in 08/2015.  She is off methimazole since 09/2015.  Her Graves' antibodies normalized: Lab Results  Component Value Date   TSI <89 12/20/2019   TSI <89 12/05/2018   TSI <89 08/07/2017   TSI 351 (H) 07/29/2016   TSI 471 (H) 01/29/2016   TSI 247 (H) 08/14/2015   TSI 297 (H) 09/16/2014   Reviewed her TFTs: Lab Results  Component Value Date   TSH 2.26 12/20/2019   TSH 1.57 12/05/2018   TSH 1.69 02/06/2018   TSH 1.78 08/07/2017   TSH 1.60 02/01/2017   TSH 2.80 07/29/2016   TSH 1.13 05/12/2016   TSH 1.53 04/08/2016   TSH 0.810 01/29/2016   TSH 1.27 11/11/2015   FREET4 1.01 12/20/2019   FREET4 1.18 12/05/2018   FREET4 0.77 02/06/2018    FREET4 0.78 08/07/2017   FREET4 0.84 02/01/2017   FREET4 1.1 07/29/2016   FREET4 0.81 05/12/2016   FREET4 1.03 04/08/2016   FREET4 1.31 01/29/2016   FREET4 0.82 11/11/2015   Pt denies: - feeling nodules in neck - hoarseness - dysphagia - choking - SOB with lying down  Graves ophthalmopathy: Reviewed history: 05/18/2015:  Called by Dr. Dione Booze: Patient seen in his office recently for regular eye exam while on Plaquenil. She noticed a visual field defect in one of her eyes and had her come back for repeat. The second visual field was worse. He obtained an MRI of her brain with focus on the pituitary that showed normal brain structures and pituitary gland, however ocular muscles were enlarged. The third visual field was even worse than the previous two. She was referred to [facility] ophthalmology, where she is seeing Dr. Toni Arthurs. She is planning to perform decompression surgery. However, due to the severity of the visual field defects, she recommended pulse steroids with Solu-Medrol. She can do this in our office, however patient would like to have this locally. Dr. Dione Booze called me to see if this can be arranged.   The recommended Solu-Medrol regimen: - 500 mg once a week for 6 weeks, followed by - 250 mg once a week for the next 6 weeks.  I d/w Dr Toni Arthurs and  plan was for pt to have decompressive surgery first >> she had this 05/21/2015, and then started the above Solumedrol regimen - last dose 08/2015.   She had significant diplopia in the past that she could only drive with corrective glasses.  However, after her strabismus surgery in 10/2016, she has very little double vision only when looking to the left.  She does not need prism glasses anymore.    She started to feel that she cannot see very well at the distance.  She has an appointment with her ophthalmologist coming up.  Elevated prolactin (normal: 2.8-29.2)   Reviewed prolactin levels: Lab Results  Component Value Date    PROLACTIN 8.7 12/20/2019   PROLACTIN 12.9 12/05/2018   PROLACTIN 10.0 08/07/2017   PROLACTIN 11.3 07/29/2016   PROLACTIN 28.5 09/25/2015   PROLACTIN 30.9 01/12/2015   PROLACTIN 39.9 07/22/2014   PROLACTIN 40.3 07/16/2014   Previously: Component     Latest Ref Rng 09/16/2014  Prolactin, Total      28.0  Prolactin, Monomeric     3.2 - 25.2 ng/mL 23.0   No headaches or galactorrhea.  She also has a dx. Of Sjogren Sd.  She is seeing Dr. Pamella Pert.  ROS: Constitutional: no weight gain, + weight loss, no fatigue, no subjective hyperthermia, + subjective hypothermia Eyes: no blurry vision, no xerophthalmia ENT: no sore throat, + see HPI Cardiovascular: no CP/no SOB/no palpitations/no leg swelling Respiratory: no cough/no SOB/no wheezing Gastrointestinal: no N/no V/no D/no C/no acid reflux Musculoskeletal: no muscle aches/no joint aches Skin: no rashes, no hair loss Neurological: no tremors/no numbness/no tingling/no dizziness  I reviewed pt's medications, allergies, PMH, social hx, family hx, and changes were documented in the history of present illness. Otherwise, unchanged from my initial visit note.  Past Medical History:  Diagnosis Date   Compressive optic neuropathy    Graves disease    Hepatitis A    Seasonal allergies    Sjogren's disease (HCC)    positive ANA nad Ro   Thyroid disease    Thyroid eye disease    Past Surgical History:  Procedure Laterality Date   EYE SURGERY Bilateral 2017, 2018   release of optic pressure    FUNCTIONAL ENDOSCOPIC SINUS SURGERY Bilateral 05/2015   Orbital Decompression Bilateral 05/2015    History   Social History   Marital Status: Married    Spouse Name: N/A   Number of Children: no   Occupational History    Teaching laboratory technician    Social History Main Topics   Smoking status: Never Smoker    Smokeless tobacco: Never Used   Alcohol Use: Yes     Comment: weekends   Current Outpatient Medications on File Prior to Visit   Medication Sig Dispense Refill   cetirizine (ZYRTEC) 10 MG tablet Take 10 mg by mouth daily.     hydroxychloroquine (PLAQUENIL) 200 MG tablet TAKE 1 TABLET BY MOUTH EVERY DAY 90 tablet 0   pilocarpine (SALAGEN) 5 MG tablet TAKE 1 TABLET(5 MG) BY MOUTH THREE TIMES DAILY AS NEEDED 270 tablet 0   RESTASIS 0.05 % ophthalmic emulsion 1 drop 2 (two) times daily.     No current facility-administered medications on file prior to visit.   No Known Allergies   Family History  Problem Relation Age of Onset   Hypertension Mother    Retinal detachment Mother    Thyroid disease Mother    Diabetes Father    Thyroid disease Sister    Other Sister  tonsil    Heart attack Maternal Grandfather    Breast cancer Neg Hx    Colon cancer Neg Hx    Stomach cancer Neg Hx    Pancreatic cancer Neg Hx    Esophageal cancer Neg Hx   - also see history of present illness  PE: BP 120/72 (BP Location: Right Arm, Patient Position: Sitting, Cuff Size: Normal)   Pulse 81   Ht 5\' 1"  (1.549 m)   Wt 151 lb 9.6 oz (68.8 kg)   SpO2 99%   BMI 28.64 kg/m  Body mass index is 28.64 kg/m. Wt Readings from Last 3 Encounters:  12/22/20 151 lb 9.6 oz (68.8 kg)  09/16/20 160 lb 3.2 oz (72.7 kg)  05/20/20 154 lb 6.4 oz (70 kg)   Constitutional: Slightly overweight, in NAD Eyes: PERRLA, EOMI, no exophthalmos ENT: moist mucous membranes, no thyromegaly, no cervical lymphadenopathy Cardiovascular: RRR, No MRG Respiratory: CTA B Gastrointestinal: abdomen soft, NT, ND, BS+ Musculoskeletal: no deformities, strength intact in all 4 Skin: moist, warm, no rashes Neurological: no tremor with outstretched hands, DTR normal in all 4  ASSESSMENT: 1. Mild Graves ds.  2. Graves ophthalmopathy - had Solumedrol - 500 mg weekly x 6 doses, then 250 mg weekly x 6 doses - last in 08/2015.   3. Elevated prolactin  PLAN:  1. Patient with mild Graves' disease, based on the presence of TSI antibodies and her uptake and scan,  however, with very significant Graves' ophthalmopathy, now improved. -Previously on methimazole but was able to come off it in 09/2015 -She remains asymptomatic.  At last visit she complained of cold intolerance and skin itching but this was relieved by emollient creams.   -She was on selenium -taken off all supplements after LFTs increased last year. LFTs are now normal, but will keep her off Selenium. -Reviewed latest TFTs from a year ago and these were all normal -We will recheck her TFTs today -Otherwise, we will see her back in a year  2. GO  -She has no new complaints at this visit -She has a history of significant diplopia, for which we gave her serial Solu-Medrol injections and she has eye surgeries (decompression surgery 05/2015, strabismus surgery in 10/2016-successful) and corrective eyeglasses. Now she only has dry eyes and feels that she cannot see very well at the distance. -At last visit, TSI antibodies were within the normal range -We will not recheck her TSI antibodies today -She continues to see ophthalmology, has an appointment coming up  3. Elevated PRL  -Patient with previously high prolactin levels, normalized in the last 5 years, not on any medications -We ruled out macro prolactin -She is now postmenopausal -No headaches or galactorrhea -We will check another prolactin level today  Component     Latest Ref Rng & Units 12/22/2020  TSH     0.35 - 5.50 uIU/mL 2.85  T4,Free(Direct)     0.60 - 1.60 ng/dL 12/24/2020  Triiodothyronine,Free,Serum     2.3 - 4.2 pg/mL 3.6  Prolactin     ng/mL 9.8  All the tests are excellent.  I do not feel we absolutely need to recheck her prolactin level going forward.  5.36, MD PhD [facility]

## 2020-12-22 NOTE — Patient Instructions (Addendum)
Please stop at the lab.  Please come back for a follow-up appointment in 1 year.  

## 2020-12-25 ENCOUNTER — Ambulatory Visit (INDEPENDENT_AMBULATORY_CARE_PROVIDER_SITE_OTHER): Payer: 59 | Admitting: Internal Medicine

## 2020-12-25 ENCOUNTER — Encounter: Payer: Self-pay | Admitting: Internal Medicine

## 2020-12-25 ENCOUNTER — Other Ambulatory Visit: Payer: Self-pay

## 2020-12-25 VITALS — BP 122/80 | HR 81 | Temp 98.1°F | Resp 16 | Ht 61.0 in | Wt 152.2 lb

## 2020-12-25 DIAGNOSIS — Z0000 Encounter for general adult medical examination without abnormal findings: Secondary | ICD-10-CM

## 2020-12-25 LAB — LIPID PANEL
Cholesterol: 167 mg/dL (ref <200)
HDL: 59 mg/dL (ref >=50)
LDL Cholesterol (Calc): 85 mg/dL (calc)
Non-HDL Cholesterol (Calc): 108 mg/dL (calc) (ref <130)
Total CHOL/HDL Ratio: 2.8 (calc) (ref <5.0)
Triglycerides: 131 mg/dL (ref <150)

## 2020-12-25 LAB — B12 AND FOLATE PANEL
Folate: 12.9 ng/mL
Vitamin B-12: 606 pg/mL (ref 200–1100)

## 2020-12-25 NOTE — Patient Instructions (Addendum)
Recommend to proceed with the following vaccines at your pharmacy:  Shingrix (to prevent shingles) Covid #4 Flu shot this fall      GO TO THE LAB : Get the blood work     GO TO THE FRONT DESK, PLEASE SCHEDULE YOUR APPOINTMENTS Come back for a physical exam in 1 year

## 2020-12-25 NOTE — Progress Notes (Signed)
Subjective:    Patient ID: Cynthia Fox, female    DOB: Dec 25, 1970, 50 y.o.   MRN: [id number]  DOS:  12/25/2020 Type of visit - description: CPX  Since the last office visit is doing well. Has no major concerns.   Wt Readings from Last 3 Encounters:  12/25/20 152 lb 4 oz (69.1 kg)  12/22/20 151 lb 9.6 oz (68.8 kg)  09/16/20 160 lb 3.2 oz (72.7 kg)    Review of Systems  Other than above, a 14 point review of systems is negative     Past Medical History:  Diagnosis Date   Compressive optic neuropathy    Graves disease    Hepatitis A    Seasonal allergies    Sjogren's disease (HCC)    positive ANA nad Ro   Thyroid disease    Thyroid eye disease     Past Surgical History:  Procedure Laterality Date   EYE SURGERY Bilateral 2017, 2018   release of optic pressure    FUNCTIONAL ENDOSCOPIC SINUS SURGERY Bilateral 05/2015   Orbital Decompression Bilateral 05/2015   Social History   Socioeconomic History   Marital status: Married    Spouse name: Not on file   Number of children: 0   Years of education: Not on file   Highest education level: Not on file  Occupational History   Occupation: shipping office   Tobacco Use   Smoking status: Never   Smokeless tobacco: Never  Vaping Use   Vaping Use: Never used  Substance and Sexual Activity   Alcohol use: Not Currently    Alcohol/week: 0.0 standard drinks    Comment: weekends   Drug use: No   Sexual activity: Yes    Partners: Male    Comment: 1st intercourse- 48, partners- 4, married- 18 yrs   Other Topics Concern   Not on file  Social History Narrative   Original from [place]    Lives w/ husband   Social Determinants of Health   Financial Resource Strain: Not on file  Food Insecurity: Not on file  Transportation Needs: Not on file  Physical Activity: Not on file  Stress: Not on file  Social Connections: Not on file  Intimate Partner Violence: Not on file    Allergies as of 12/25/2020   No Known  Allergies      Medication List        Accurate as of December 25, 2020 11:59 PM. If you have any questions, ask your nurse or doctor.          STOP taking these medications    cetirizine 10 MG tablet Commonly known as: ZYRTEC Stopped by: Willow Ora, MD       TAKE these medications    calcium carbonate 1250 (500 Ca) MG tablet Commonly known as: OS-CAL - dosed in mg of elemental calcium Take 1 tablet by mouth.   hydroxychloroquine 200 MG tablet Commonly known as: PLAQUENIL TAKE 1 TABLET BY MOUTH EVERY DAY   pilocarpine 5 MG tablet Commonly known as: SALAGEN TAKE 1 TABLET(5 MG) BY MOUTH THREE TIMES DAILY AS NEEDED   Restasis 0.05 % ophthalmic emulsion Generic drug: cycloSPORINE 1 drop 2 (two) times daily.           Objective:   Physical Exam BP 122/80 (BP Location: Left Arm, Patient Position: Sitting, Cuff Size: Small)   Pulse 81   Temp 98.1 F (36.7 C) (Oral)   Resp 16   Ht 5\' 1"  (1.549 m)  Wt 152 lb 4 oz (69.1 kg)   SpO2 98%   BMI 28.77 kg/m  General: Well developed, NAD, BMI noted Neck: No  thyromegaly  HEENT:  Normocephalic . Face symmetric, atraumatic Lungs:  CTA B Normal respiratory effort, no intercostal retractions, no accessory muscle use. Heart: RRR,  no murmur.  Abdomen:  Not distended, soft, non-tender. No rebound or rigidity.   Lower extremities: no pretibial edema bilaterally  Skin: Exposed areas without rash. Not pale. Not jaundice Neurologic:  alert & oriented X3.  Speech normal, gait appropriate for age and unassisted Strength symmetric and appropriate for age.  Psych: Cognition and judgment appear intact.  Cooperative with normal attention span and concentration.  Behavior appropriate. No anxious or depressed appearing.     Assessment      Assessment ENDO:    Dr Elvera Lennox --Luiz Blare' disease dx 2016, ophthalmopathy dx ~ 04-2015 , had diplopia, s/p orbital decompression surgery ~ 05-2015   --h/o hyperprolactinemia  RHEUM:   Dr Victory Dakin  Sjogren syndrome  Dx 2016  --  + ANA Menopausal   PLAN: Here for CPX Doing well, for her chronic medical problems see other providers. RTC 1 year   This visit occurred during the SARS-CoV-2 public health emergency.  Safety protocols were in place, including screening questions prior to the visit, additional usage of staff PPE, and extensive cleaning of exam room while observing appropriate contact time as indicated for disinfecting solutions.

## 2020-12-26 ENCOUNTER — Encounter: Payer: Self-pay | Admitting: Internal Medicine

## 2020-12-26 NOTE — Assessment & Plan Note (Signed)
-  Td 2014  -Shingrix discussed - s/p C-19 shot, booster recommended -Flu shot recommended - female care per gyn,  MMG 02-2020 --CCS: Negative cologuard 01/05/2020 -labs: Reviewed, will get a FLP and a vitamin B12 (MCV elevated) -Diet and exercise: Doing great, since last year has lost few pounds, working harder on better diet and exercise. Praised

## 2020-12-26 NOTE — Assessment & Plan Note (Signed)
Here for CPX Doing well, for her chronic medical problems see other providers. RTC 1 year

## 2020-12-28 ENCOUNTER — Encounter: Payer: 59 | Admitting: Internal Medicine

## 2021-01-01 ENCOUNTER — Other Ambulatory Visit: Payer: Self-pay | Admitting: Internal Medicine

## 2021-01-01 DIAGNOSIS — Z1231 Encounter for screening mammogram for malignant neoplasm of breast: Secondary | ICD-10-CM

## 2021-02-03 NOTE — Progress Notes (Signed)
Office Visit Note  Patient: Cynthia Fox             Date of Birth: June 12, 1970           MRN: [id number]             PCP: Wanda Plump, MD Referring: Wanda Plump, MD Visit Date: 02/17/2021 Occupation: @GUAROCC @  Subjective:  Raynauds phenomenon, dry mouth and dry eyes.  History of Present Illness: Cynthia Fox is a 50 y.o. female with a history of Sjogren's and Raynaud's phenomenon.  She states she is doing quite well on the combination of hydroxychloroquine and pilocarpine.  She has been taking pilocarpine 1 tablet twice a day.  She also uses Restasis eyedrops.  Despite taking the medication she continues to have some dry mouth and dry eye symptoms.  During the winter months she experiences increased Raynauds symptoms.  She notices some Raynauds currently.  There is no history of joint pain or joint swelling.  She denies any shortness of breath or palpitations.  She is taking vitamin D 10,000 units per week.  Activities of Daily Living:  Patient reports morning stiffness for a few minutes.   Patient Denies nocturnal pain.  Difficulty dressing/grooming: Denies Difficulty climbing stairs: Denies Difficulty getting out of chair: Denies Difficulty using hands for taps, buttons, cutlery, and/or writing: Denies  Review of Systems  Constitutional:  Negative for fatigue.  HENT:  Positive for mouth dryness and nose dryness. Negative for mouth sores.   Eyes:  Positive for dryness. Negative for pain and itching.  Respiratory:  Negative for shortness of breath and difficulty breathing.   Cardiovascular:  Negative for chest pain and palpitations.  Gastrointestinal:  Negative for blood in stool, constipation and diarrhea.  Endocrine: Negative for increased urination.  Genitourinary:  Negative for difficulty urinating.  Musculoskeletal:  Positive for myalgias, morning stiffness, muscle tenderness and myalgias. Negative for joint pain, joint pain and joint swelling.  Skin:  Positive for color  change. Negative for rash, redness and sensitivity to sunlight.  Allergic/Immunologic: Negative for susceptible to infections.  Neurological:  Positive for numbness. Negative for dizziness, headaches, memory loss and weakness.  Hematological:  Negative for bruising/bleeding tendency.  Psychiatric/Behavioral:  Negative for depressed mood, confusion and sleep disturbance. The patient is not nervous/anxious.    PMFS History:  Patient Active Problem List   Diagnosis Date Noted   H. 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   Annual physical exam 10/24/2016   Menopause 10/13/2016   H/O seasonal allergies 03/23/2016   Vitamin D deficiency 03/23/2016   High risk medication use 03/22/2016   Sjogren's syndrome (HCC) 06/25/2015   PCP NOTES >>>>>>>>>>>>>>>>>>>>>> 06/25/2015   Graves disease 06/01/2015   Hyperprolactinemia (HCC) 01/12/2015   Elevated antinuclear antibody (ANA) level 07/23/2014    Past Medical History:  Diagnosis Date   Compressive optic neuropathy    Graves disease    Hepatitis A    Seasonal allergies    Sjogren's disease (HCC)    positive ANA nad Ro   Thyroid disease    Thyroid eye disease     Family History  Problem Relation Age of Onset   Hypertension Mother    Retinal detachment Mother    Thyroid disease Mother    Diabetes Father    Thyroid disease Sister    Other Sister        tonsil    Heart attack Maternal Grandfather    Breast cancer Neg Hx  Colon cancer Neg Hx    Stomach cancer Neg Hx    Pancreatic cancer Neg Hx    Esophageal cancer Neg Hx    Past Surgical History:  Procedure Laterality Date   EYE SURGERY Bilateral 2017, 2018   release of optic pressure    FUNCTIONAL ENDOSCOPIC SINUS SURGERY Bilateral 05/2015   Orbital Decompression Bilateral 05/2015   Social History   Social History Narrative   Original from [place]    Lives w/ husband   Immunization History  Administered Date(s) Administered    Influenza,inj,Quad PF,6+ Mos 12/24/2018   Influenza-Unspecified 12/19/2019   PFIZER(Purple Top)SARS-COV-2 Vaccination 07/04/2019, 07/25/2019, 03/10/2020   Tdap 07/30/2012     Objective: Vital Signs: BP 115/78 (BP Location: Left Arm, Patient Position: Sitting, Cuff Size: Normal)   Pulse 85   Ht 5' (1.524 m)   Wt 153 lb 12.8 oz (69.8 kg)   BMI 30.04 kg/m    Physical Exam Vitals and nursing note reviewed.  Constitutional:      Appearance: She is well-developed.  HENT:     Head: Normocephalic and atraumatic.  Eyes:     Conjunctiva/sclera: Conjunctivae normal.  Cardiovascular:     Rate and Rhythm: Normal rate and regular rhythm.     Heart sounds: Normal heart sounds.  Pulmonary:     Effort: Pulmonary effort is normal.     Breath sounds: Normal breath sounds.  Abdominal:     General: Bowel sounds are normal.     Palpations: Abdomen is soft.  Musculoskeletal:     Cervical back: Normal range of motion.  Lymphadenopathy:     Cervical: No cervical adenopathy.  Skin:    General: Skin is warm and dry.     Capillary Refill: Capillary refill takes less than 2 seconds.     Comments: No nailbed capillary changes, sclerodactyly or telangiectasias were noted.  Neurological:     Mental Status: She is alert and oriented to person, place, and time.  Psychiatric:        Behavior: Behavior normal.     Musculoskeletal Exam: C-spine, thoracic and lumbar spine with good range of motion.  Shoulder joints, elbow joints, wrist joints, MCPs PIPs and DIPs with good range of motion with no synovitis.  Hip joints, knee joints, ankles, MTPs and PIPs with good range of motion with no synovitis.  CDAI Exam: CDAI Score: -- Patient Global: --; Provider Global: -- Swollen: --; Tender: -- Joint Exam 02/17/2021   No joint exam has been documented for this visit   There is currently no information documented on the homunculus. Go to the Rheumatology activity and complete the homunculus joint  exam.  Investigation: No additional findings.  Imaging: MM 3D SCREEN BREAST BILATERAL  Result Date: 02/12/2021 CLINICAL DATA:  Screening. EXAM: DIGITAL SCREENING BILATERAL MAMMOGRAM WITH TOMOSYNTHESIS AND CAD TECHNIQUE: Bilateral screening digital craniocaudal and mediolateral oblique mammograms were obtained. Bilateral screening digital breast tomosynthesis was performed. The images were evaluated with computer-aided detection. COMPARISON:  Previous exam(s). ACR Breast Density Category b: There are scattered areas of fibroglandular density. FINDINGS: There are no findings suspicious for malignancy. IMPRESSION: No mammographic evidence of malignancy. A result letter of this screening mammogram will be mailed directly to the patient. RECOMMENDATION: Screening mammogram in one year. (Code:SM-B-01Y) BI-RADS CATEGORY  1: Negative. Electronically Signed   By: Edwin Cap M.D.   On: 02/12/2021 07:54    Recent Labs: Lab Results  Component Value Date   WBC 5.7 09/16/2020   HGB 12.9 09/16/2020  PLT 264 09/16/2020   NA 139 09/16/2020   K 4.5 09/16/2020   CL 104 09/16/2020   CO2 30 09/16/2020   GLUCOSE 99 09/16/2020   BUN 15 09/16/2020   CREATININE 0.67 09/16/2020   BILITOT 0.7 09/16/2020   ALKPHOS 129 (H) 06/11/2020   AST 25 09/16/2020   ALT 28 09/16/2020   PROT 7.2 09/16/2020   ALBUMIN 4.3 06/11/2020   CALCIUM 9.2 09/16/2020   GFRAA 117 09/16/2020    Speciality Comments: PLQ Eye Exam: 01/04/2021 WNL@ [facility] Follow up in 1 year  Procedures:  No procedures performed Allergies: Patient has no known allergies.   Assessment / Plan:     Visit Diagnoses: Sjogren's syndrome with keratoconjunctivitis sicca (HCC) - Positive ANA, positive Ro, positive rheumatoid factor, negative CCP: Pilocarpine 5 mg p.o. 2 times daily as needed. -She continues to have sicca symptoms despite being on pilocarpine Restasis and hydroxychloroquine.  She denies any shortness of breath or palpitations.   Over-the-counter products were reviewed.  Plan: Urinalysis, Routine w reflex microscopic  High risk medication use - Plaquenil 200 mg 1 tablet by mouth daily. PLQ Eye Exam: 01/04/2021 - Plan: CBC with Differential/Platelet, COMPLETE METABOLIC PANEL WITH GFR today.  We will get autoimmune labs in 6 months.  Raynaud's disease without gangrene - she has taken Norvasc in the past for Raynauds during the winter months.  I gave her a prescription for amlodipine 2.5 mg p.o. daily today.  If she tolerates it well and had no side effects then we can increase it to 5 mg p.o. daily.  Side effects of amlodipine were discussed.  Keeping core temperature warm and warm clothing was discussed.  Elevated LFTs -LFTs are improving.  She had GI evaluation in the past.  History of vitamin D deficiency-she is on vitamin D 10,000 units weekly.  Primary insomnia-good sleep hygiene discussed.  History of Graves' disease  Orders: Orders Placed This Encounter  Procedures   CBC with Differential/Platelet   COMPLETE METABOLIC PANEL WITH GFR   Urinalysis, Routine w reflex microscopic    Meds ordered this encounter  Medications   amLODipine (NORVASC) 2.5 MG tablet    Sig: Take 1 tablet (2.5 mg total) by mouth daily.    Dispense:  30 tablet    Refill:  2     Follow-Up Instructions: Return in about 3 months (around 05/20/2021) for Sjogren's.   Pollyann Savoy, MD  Note - This record has been created using Animal nutritionist.  Chart creation errors have been sought, but may not always  have been located. Such creation errors do not reflect on  the standard of medical care.

## 2021-02-11 ENCOUNTER — Other Ambulatory Visit: Payer: Self-pay

## 2021-02-11 ENCOUNTER — Ambulatory Visit
Admission: RE | Admit: 2021-02-11 | Discharge: 2021-02-11 | Disposition: A | Discharge status: Home / Self Care | Payer: 59 | Source: Ambulatory Visit | Attending: Internal Medicine | Admitting: Internal Medicine

## 2021-02-11 DIAGNOSIS — Z1231 Encounter for screening mammogram for malignant neoplasm of breast: Secondary | ICD-10-CM

## 2021-02-17 ENCOUNTER — Ambulatory Visit: Payer: 59 | Admitting: Rheumatology

## 2021-02-17 ENCOUNTER — Other Ambulatory Visit: Payer: Self-pay

## 2021-02-17 ENCOUNTER — Encounter: Payer: Self-pay | Admitting: Rheumatology

## 2021-02-17 VITALS — BP 115/78 | HR 85 | Ht 60.0 in | Wt 153.8 lb

## 2021-02-17 DIAGNOSIS — M3501 Sicca syndrome with keratoconjunctivitis: Secondary | ICD-10-CM

## 2021-02-17 DIAGNOSIS — R7989 Other specified abnormal findings of blood chemistry: Secondary | ICD-10-CM | POA: Diagnosis not present

## 2021-02-17 DIAGNOSIS — F5101 Primary insomnia: Secondary | ICD-10-CM

## 2021-02-17 DIAGNOSIS — Z8639 Personal history of other endocrine, nutritional and metabolic disease: Secondary | ICD-10-CM

## 2021-02-17 DIAGNOSIS — Z79899 Other long term (current) drug therapy: Secondary | ICD-10-CM | POA: Diagnosis not present

## 2021-02-17 DIAGNOSIS — I7300 Raynaud's syndrome without gangrene: Secondary | ICD-10-CM

## 2021-02-17 MED ORDER — AMLODIPINE BESYLATE 2.5 MG PO TABS: 2.5000 mg | ORAL_TABLET | Freq: Every day | ORAL | 2 refills | Status: DC

## 2021-02-18 LAB — COMPLETE METABOLIC PANEL WITH GFR
AG Ratio: 1.5 (calc) (ref 1.0–2.5)
ALT: 29 U/L (ref 6–29)
AST: 27 U/L (ref 10–35)
Albumin: 4.5 g/dL (ref 3.6–5.1)
Alkaline phosphatase (APISO): 133 U/L (ref 37–153)
BUN: 15 mg/dL (ref 7–25)
CO2: 30 mmol/L (ref 20–32)
Calcium: 9.5 mg/dL (ref 8.6–10.4)
Chloride: 101 mmol/L (ref 98–110)
Creat: 0.67 mg/dL (ref 0.50–1.03)
Globulin: 3 g/dL (calc) (ref 1.9–3.7)
Glucose, Bld: 91 mg/dL (ref 65–99)
Potassium: 4.6 mmol/L (ref 3.5–5.3)
Sodium: 139 mmol/L (ref 135–146)
Total Bilirubin: 0.7 mg/dL (ref 0.2–1.2)
Total Protein: 7.5 g/dL (ref 6.1–8.1)
eGFR: 105 mL/min/{1.73_m2} (ref >=60)

## 2021-02-18 LAB — CBC WITH DIFFERENTIAL/PLATELET
Absolute Monocytes: 545 cells/uL (ref 200–950)
Basophils Absolute: 58 cells/uL (ref 0–200)
Basophils Relative: 1 %
Eosinophils Absolute: 557 cells/uL — ABNORMAL HIGH (ref 15–500)
Eosinophils Relative: 9.6 %
HCT: 39.3 % (ref 35.0–45.0)
Hemoglobin: 13.5 g/dL (ref 11.7–15.5)
Lymphs Abs: 1949 cells/uL (ref 850–3900)
MCH: 34.3 pg — ABNORMAL HIGH (ref 27.0–33.0)
MCHC: 34.4 g/dL (ref 32.0–36.0)
MCV: 99.7 fL (ref 80.0–100.0)
MPV: 11.6 fL (ref 7.5–12.5)
Monocytes Relative: 9.4 %
Neutro Abs: 2691 cells/uL (ref 1500–7800)
Neutrophils Relative %: 46.4 %
Platelets: 292 10*3/uL (ref 140–400)
RBC: 3.94 10*6/uL (ref 3.80–5.10)
RDW: 12.4 % (ref 11.0–15.0)
Total Lymphocyte: 33.6 %
WBC: 5.8 10*3/uL (ref 3.8–10.8)

## 2021-02-18 LAB — URINALYSIS, ROUTINE W REFLEX MICROSCOPIC
Bilirubin Urine: NEGATIVE
Glucose, UA: NEGATIVE
Hgb urine dipstick: NEGATIVE
Ketones, ur: NEGATIVE
Leukocytes,Ua: NEGATIVE
Nitrite: NEGATIVE
Protein, ur: NEGATIVE
Specific Gravity, Urine: 1.017 (ref 1.001–1.035)
pH: 7.5 (ref 5.0–8.0)

## 2021-02-18 NOTE — Progress Notes (Signed)
CBC, CMP and UA are within normal limits.  Eosinophils are mildly elevated and stable which may be due to the allergies.

## 2021-02-25 ENCOUNTER — Other Ambulatory Visit: Payer: Self-pay

## 2021-02-25 ENCOUNTER — Ambulatory Visit (INDEPENDENT_AMBULATORY_CARE_PROVIDER_SITE_OTHER): Payer: 59 | Admitting: Obstetrics & Gynecology

## 2021-02-25 ENCOUNTER — Encounter: Payer: Self-pay | Admitting: Obstetrics & Gynecology

## 2021-02-25 ENCOUNTER — Other Ambulatory Visit (HOSPITAL_COMMUNITY)
Admission: RE | Admit: 2021-02-25 | Discharge: 2021-02-25 | Disposition: A | Discharge status: Home / Self Care | Payer: 59 | Source: Ambulatory Visit | Attending: Obstetrics & Gynecology | Admitting: Obstetrics & Gynecology

## 2021-02-25 VITALS — BP 118/80 | HR 82 | Resp 15 | Ht <= 58 in | Wt 152.0 lb

## 2021-02-25 DIAGNOSIS — Z780 Asymptomatic menopausal state: Secondary | ICD-10-CM

## 2021-02-25 DIAGNOSIS — Z01419 Encounter for gynecological examination (general) (routine) without abnormal findings: Secondary | ICD-10-CM | POA: Diagnosis present

## 2021-02-25 DIAGNOSIS — Z6832 Body mass index (BMI) 32.0-32.9, adult: Secondary | ICD-10-CM

## 2021-02-25 DIAGNOSIS — L989 Disorder of the skin and subcutaneous tissue, unspecified: Secondary | ICD-10-CM

## 2021-02-25 DIAGNOSIS — E6609 Other obesity due to excess calories: Secondary | ICD-10-CM | POA: Diagnosis not present

## 2021-02-25 NOTE — Progress Notes (Signed)
Cynthia Fox Oct 21, 1970 [id number]   History:    50 y.o.  G0 Married   RP:  Established patient presenting for annual gyn exam    HPI: Postmenopause, well on no hormone replacement therapy.  No postmenopausal bleeding.  No pelvic pain.  No pain with IC, but low libido.  Pap 03/2018 Neg. Urine and bowel movements normal.  Breasts normal.  Screening mammo 02/2021 Neg. Body mass index 32.04.  Not walking regularly.  Health labs with Dr. Drue Fox.  No colono yet, will organize with her Cynthia Fox.  New skin lesion at the right axillary line, will refer to Derm[facility].   Past medical history,surgical history, family history and social history were all reviewed and documented in the EPIC chart.  Gynecologic History Patient's last menstrual period was 05/25/2016.  Obstetric History OB History  Gravida Para Term Preterm AB Living  0 0 0 0 0 0  SAB IAB Ectopic Multiple Live Births  0 0 0 0       ROS: A ROS was performed and pertinent positives and negatives are included in the history.  GENERAL: No fevers or chills. HEENT: No change in vision, no earache, sore throat or sinus congestion. NECK: No pain or stiffness. CARDIOVASCULAR: No chest pain or pressure. No palpitations. PULMONARY: No shortness of breath, cough or wheeze. GASTROINTESTINAL: No abdominal pain, nausea, vomiting or diarrhea, melena or bright red blood per rectum. GENITOURINARY: No urinary frequency, urgency, hesitancy or dysuria. MUSCULOSKELETAL: No joint or muscle pain, no back pain, no recent trauma. DERMATOLOGIC: No rash, no itching, no lesions. ENDOCRINE: No polyuria, polydipsia, no heat or cold intolerance. No recent change in weight. HEMATOLOGICAL: No anemia or easy bruising or bleeding. NEUROLOGIC: No headache, seizures, numbness, tingling or weakness. PSYCHIATRIC: No depression, no loss of interest in normal activity or change in sleep pattern.     Exam:   BP 118/80   Pulse 82   Resp 15   Ht 4' 9.75" (1.467 m)   Wt 152 lb  (68.9 kg)   LMP 05/25/2016   BMI 32.04 kg/m   Body mass index is 32.04 kg/m.  General appearance : Well developed well nourished female. No acute distress Skin:  New raised oval lesion with scaling at the Rt axillary line. HEENT: Eyes: no retinal hemorrhage or exudates,  Neck supple, trachea midline, no carotid bruits, no thyroidmegaly Lungs: Clear to auscultation, no rhonchi or wheezes, or rib retractions  Heart: Regular rate and rhythm, no murmurs or gallops Breast:Examined in sitting and supine position were symmetrical in appearance, no palpable masses or tenderness,  no skin retraction, no nipple inversion, no nipple discharge, no skin discoloration, no axillary or supraclavicular lymphadenopathy Abdomen: no palpable masses or tenderness, no rebound or guarding Extremities: no edema or skin discoloration or tenderness  Pelvic: Vulva: Normal             Vagina: No gross lesions or discharge  Cervix: No gross lesions or discharge.  Pap reflex done.  Uterus  AV, normal size, shape and consistency, non-tender and mobile  Adnexa  Without masses or tenderness  Anus: Normal   Assessment/Plan:  50 y.o. female for annual exam   1. Encounter for routine gynecological examination with Papanicolaou smear of cervix Postmenopause, well on no hormone replacement therapy.  No postmenopausal bleeding.  No pelvic pain.  No pain with IC, but low libido.  Pap 03/2018 Neg. Pap reflex done today. Urine and bowel movements normal.  Breasts normal.  Screening mammo 02/2021  Neg. Body mass index 32.04.  Not walking regularly.  Health labs with Dr. Drue Fox.  No colono yet, will organize with her Cynthia Fox.  New skin lesion at the right axillary line, will refer to Dermato. - Cytology - PAP( [place])  2. Postmenopause Well on no HRT.  No PMB.  Counseling done on low libido.  3. Skin lesion of chest wall/Right axillary line Refer to Dermato.  4. Class 1 obesity due to excess calories without serious  comorbidity with body mass index (BMI) of 32.0 to 32.9 in adult Low calorie/carb diet.  Increase fitness activities.  Other orders - VITAMIN D PO; Take by mouth.   Cynthia Del MD, 3:46 PM 02/25/2021

## 2021-02-26 ENCOUNTER — Telehealth: Payer: Self-pay | Admitting: *Deleted

## 2021-02-26 NOTE — Telephone Encounter (Signed)
Referral sent to Dr. Floyce Stakes, MD

## 2021-02-26 NOTE — Telephone Encounter (Signed)
-----   Message from Reatha Armour, [place] sent at 02/25/2021  4:18 PM EST ----- Regarding: FW: Refer to Dermato  ----- Message ----- From: Genia Del, MD Sent: 02/25/2021   4:10 PM EST To: [facility] Subject: Refer to [facility]erm[facility]                               Rt thorax/axillary line raise skin lesion oval about 8 mm with scaling.

## 2021-03-01 LAB — CYTOLOGY - PAP: Diagnosis: NEGATIVE

## 2021-03-02 ENCOUNTER — Other Ambulatory Visit: Payer: Self-pay | Admitting: Physician Assistant

## 2021-03-02 DIAGNOSIS — M3501 Sicca syndrome with keratoconjunctivitis: Secondary | ICD-10-CM

## 2021-03-02 NOTE — Telephone Encounter (Signed)
Next Visit: 04/21/2021  Last Visit: 02/17/2021  Last Fill: 07/29/2020  Dx: Sjogren's syndrome with keratoconjunctivitis sicca   Current Dose per office note on 02/17/2021: Pilocarpine 5 mg p.o. 2 times daily as needed  Okay to refill Pilocarpine?

## 2021-03-18 NOTE — Telephone Encounter (Signed)
We have repeated and unsuccessfully tried to fax referral. I even called this office and was assured the two fax #s I have are correct and working but our faxes do not go through.    I mailed the fax out to them today by regular mail. Copy of letter attached for referral is in chart.

## 2021-04-01 ENCOUNTER — Other Ambulatory Visit: Payer: Self-pay | Admitting: *Deleted

## 2021-04-01 DIAGNOSIS — M3501 Sicca syndrome with keratoconjunctivitis: Secondary | ICD-10-CM

## 2021-04-01 MED ORDER — HYDROXYCHLOROQUINE SULFATE 200 MG PO TABS
ORAL_TABLET | ORAL | 0 refills | Status: DC
Start: 2021-04-01 — End: 2021-07-28

## 2021-04-01 NOTE — Telephone Encounter (Signed)
Refill request received via fax  Next Visit: 04/21/2021  Last Visit: 02/17/2021  Labs: 02/17/2021 CBC, CMP and UA are within normal limits.  Eosinophils are mildly elevated and stable which may be due to the allergies.  Eye exam: 01/04/2021 WNL   Current Dose per office note 02/17/2021: Plaquenil 200 mg 1 tablet by mouth daily  Cynthia Fox:[id number] syndrome with keratoconjunctivitis sicca   Last Fill: 11/11/2020  Okay to refill Plaquenil?

## 2021-04-07 NOTE — Progress Notes (Deleted)
Office Visit Note  Patient: Cynthia Fox             Date of Birth: 11-16-1970           MRN: [id number]             PCP: Colon Branch, MD Referring: Colon Branch, MD Visit Date: 04/21/2021 Occupation: @GUAROCC @  Subjective:  No chief complaint on file.   History of Present Illness: Cynthia Fox is a 50 y.o. female ***   Activities of Daily Living:  Patient reports morning stiffness for *** {minute/hour:19697}.   Patient {ACTIONS;DENIES/REPORTS:21021675::"Denies"} nocturnal pain.  Difficulty dressing/grooming: {ACTIONS;DENIES/REPORTS:21021675::"Denies"} Difficulty climbing stairs: {ACTIONS;DENIES/REPORTS:21021675::"Denies"} Difficulty getting out of chair: {ACTIONS;DENIES/REPORTS:21021675::"Denies"} Difficulty using hands for taps, buttons, cutlery, and/or writing: {ACTIONS;DENIES/REPORTS:21021675::"Denies"}  No Rheumatology ROS completed.   PMFS History:  Patient Active Problem List   Diagnosis Date Noted   H. 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) 06/25/2015   PCP NOTES >>>>>>>>>>>>>>>>>>>>>> 06/25/2015   Maudell Stanbrough disease 06/01/2015   Hyperprolactinemia ([place]) 01/12/2015   Elevated antinuclear antibody (ANA) level 07/23/2014    Past Medical History:  Diagnosis Date   Compressive optic neuropathy    Julienne Vogler disease    Hepatitis A    Seasonal allergies    Sjogren's disease ([place])    positive ANA nad Ro   Thyroid disease    Thyroid eye disease     Family History  Problem Relation Age of Onset   Hypertension Mother    Retinal detachment Mother    Thyroid disease Mother    Diabetes Father    Thyroid disease Sister    Other Sister        tonsil    Heart attack Maternal Grandfather    Breast cancer Neg Hx    Colon cancer Neg Hx     Stomach cancer Neg Hx    Pancreatic cancer Neg Hx    Esophageal cancer Neg Hx    Past Surgical History:  Procedure Laterality Date   EYE SURGERY Bilateral 2017, 2018   release of optic pressure    FUNCTIONAL ENDOSCOPIC SINUS SURGERY Bilateral 05/2015   Orbital Decompression Bilateral 05/2015   Social History   Social History Narrative   Original from [employer]    Lives w/ husband   Immunization History  Administered Date(s) Administered   Influenza,inj,Quad PF,6+ Mos 12/24/2018   Influenza-Unspecified 12/19/2019   PFIZER(Purple Top)SARS-COV-2 Vaccination 07/04/2019, 07/25/2019, 03/10/2020   Tdap 07/30/2012     Objective: Vital Signs: LMP 05/25/2016    Physical Exam   Musculoskeletal Exam: ***  CDAI Exam: CDAI Score: -- Patient Global: --; Provider Global: -- Swollen: --; Tender: -- Joint Exam 04/21/2021   No joint exam has been documented for this visit   There is currently no information documented on the homunculus. Go to the Rheumatology activity and complete the homunculus joint exam.  Investigation: No additional findings.  Imaging: No results found.  Recent Labs: Lab Results  Component Value Date   WBC 5.8 02/17/2021   HGB 13.5 02/17/2021   PLT 292 02/17/2021   NA 139 02/17/2021   K 4.6 02/17/2021   CL 101 02/17/2021   CO2 30 02/17/2021   GLUCOSE 91 02/17/2021   BUN 15 02/17/2021   CREATININE 0.67 02/17/2021   BILITOT  0.7 02/17/2021   ALKPHOS 129 (H) 06/11/2020   AST 27 02/17/2021   ALT 29 02/17/2021   PROT 7.5 02/17/2021   ALBUMIN 4.3 06/11/2020   CALCIUM 9.5 02/17/2021   GFRAA 117 09/16/2020    Speciality Comments: PLQ Eye Exam: 01/04/2021 WNL@ [facility] Follow up in 1 year  Procedures:  No procedures performed Allergies: Patient has no known allergies.   Assessment / Plan:     Visit Diagnoses: No diagnosis found.  Orders: No orders of the defined types were placed in this encounter.  No orders of the defined types were placed in  this encounter.   Face-to-face time spent with patient was *** minutes. Greater than 50% of time was spent in counseling and coordination of care.  Follow-Up Instructions: No follow-ups on file.   Ellen Henri, CMA  Note - This record has been created using Animal nutritionist.  Chart creation errors have been sought, but may not always  have been located. Such creation errors do not reflect on  the standard of medical care.

## 2021-04-21 ENCOUNTER — Ambulatory Visit: Payer: 59 | Admitting: Physician Assistant

## 2021-04-26 NOTE — Progress Notes (Deleted)
Office Visit Note  Patient: Cynthia Fox             Date of Birth: [birth year]           MRN: [id number]             PCP: Colon Branch, MD Referring: Colon Branch, MD Visit Date: 05/10/2021 Occupation: @GUAROCC @  Subjective:    History of Present Illness: Cynthia Fox is a 51 y.o. female with history of Sjogren's syndrome and raynaud's disease. She is taking plaquenil 200 mg 1 tablet by mouth daily and pilocarpine 5 mg 1 tablet by mouth twice daily.    She is taking amlodipine 7.5 mg daily.   Activities of Daily Living:  Patient reports morning stiffness for *** {minute/hour:19697}.   Patient {ACTIONS;DENIES/REPORTS:21021675::"Denies"} nocturnal pain.  Difficulty dressing/grooming: {ACTIONS;DENIES/REPORTS:21021675::"Denies"} Difficulty climbing stairs: {ACTIONS;DENIES/REPORTS:21021675::"Denies"} Difficulty getting out of chair: {ACTIONS;DENIES/REPORTS:21021675::"Denies"} Difficulty using hands for taps, buttons, cutlery, and/or writing: {ACTIONS;DENIES/REPORTS:21021675::"Denies"}  No Rheumatology ROS completed.   PMFS History:  Patient Active Problem List   Diagnosis Date Noted   H. 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   Annual physical exam 10/24/2016   Menopause 10/13/2016   H/O seasonal allergies 03/23/2016   Vitamin D deficiency 03/23/2016   High risk medication use 03/22/2016   Sjogren's syndrome ([place]) 06/25/2015   PCP NOTES >>>>>>>>>>>>>>>>>>>>>> 06/25/2015   Graves disease 06/01/2015   Hyperprolactinemia ([place]) 01/12/2015   Elevated antinuclear antibody (ANA) level 07/23/2014    Past Medical History:  Diagnosis Date   Compressive optic neuropathy    Graves disease    Hepatitis A    Seasonal allergies    Sjogren's disease ([place])    positive ANA nad Ro   Thyroid disease    Thyroid eye disease     Family History  Problem Relation Age of Onset   Hypertension Mother    Retinal detachment Mother    Thyroid disease  Mother    Diabetes Father    Thyroid disease Sister    Other Sister        tonsil    Heart attack Maternal Grandfather    Breast cancer Neg Hx    Colon cancer Neg Hx    Stomach cancer Neg Hx    Pancreatic cancer Neg Hx    Esophageal cancer Neg Hx    Past Surgical History:  Procedure Laterality Date   EYE SURGERY Bilateral 2017, 2018   release of optic pressure    FUNCTIONAL ENDOSCOPIC SINUS SURGERY Bilateral 05/2015   Orbital Decompression Bilateral 05/2015   Social History   Social History Narrative   Original from [employer]    Lives w/ husband   Immunization History  Administered Date(s) Administered   Influenza,inj,Quad PF,6+ Mos 12/24/2018   Influenza-Unspecified 12/19/2019   PFIZER(Purple Top)SARS-COV-2 Vaccination 07/04/2019, 07/25/2019, 03/10/2020   Tdap 07/30/2012     Objective: Vital Signs: LMP 05/25/2016    Physical Exam Vitals and nursing note reviewed.  Constitutional:      Appearance: She is well-developed.  HENT:     Head: Normocephalic and atraumatic.  Eyes:     Conjunctiva/sclera: Conjunctivae normal.  Cardiovascular:     Rate and Rhythm: Normal rate and regular rhythm.     Heart sounds: Normal heart sounds.  Pulmonary:     Effort: Pulmonary effort is normal.     Breath sounds: Normal breath sounds.  Abdominal:     General: Bowel sounds are normal.     Palpations: Abdomen is  soft.  Musculoskeletal:     Cervical back: Normal range of motion.  Lymphadenopathy:     Cervical: No cervical adenopathy.  Skin:    General: Skin is warm and dry.     Capillary Refill: Capillary refill takes less than 2 seconds.  Neurological:     Mental Status: She is alert and oriented to person, place, and time.  Psychiatric:        Behavior: Behavior normal.     Musculoskeletal Exam: ***  CDAI Exam: CDAI Score: -- Patient Global: --; Provider Global: -- Swollen: --; Tender: -- Joint Exam 05/10/2021   No joint exam has been documented for this visit   There  is currently no information documented on the homunculus. Go to the Rheumatology activity and complete the homunculus joint exam.  Investigation: No additional findings.  Imaging: No results found.  Recent Labs: Lab Results  Component Value Date   WBC 5.8 02/17/2021   HGB 13.5 02/17/2021   PLT 292 02/17/2021   NA 139 02/17/2021   K 4.6 02/17/2021   CL 101 02/17/2021   CO2 30 02/17/2021   GLUCOSE 91 02/17/2021   BUN 15 02/17/2021   CREATININE 0.67 02/17/2021   BILITOT 0.7 02/17/2021   ALKPHOS 129 (H) 06/11/2020   AST 27 02/17/2021   ALT 29 02/17/2021   PROT 7.5 02/17/2021   ALBUMIN 4.3 06/11/2020   CALCIUM 9.5 02/17/2021   GFRAA 117 09/16/2020    Speciality Comments: PLQ Eye Exam: 01/04/2021 [place] Follow up in 1 year  Procedures:  No procedures performed Allergies: Patient has no known allergies.   Assessment / Plan:     Visit Diagnoses: No diagnosis found.  Orders: No orders of the defined types were placed in this encounter.  No orders of the defined types were placed in this encounter.   Face-to-face time spent with patient was *** minutes. Greater than 50% of time was spent in counseling and coordination of care.  Follow-Up Instructions: No follow-ups on file.   Earnestine Mealing, CMA  Note - This record has been created using Editor, commissioning.  Chart creation errors have been sought, but may not always  have been located. Such creation errors do not reflect on  the standard of medical care.

## 2021-05-10 ENCOUNTER — Ambulatory Visit: Payer: 59 | Admitting: Physician Assistant

## 2021-05-10 DIAGNOSIS — Z8639 Personal history of other endocrine, nutritional and metabolic disease: Secondary | ICD-10-CM

## 2021-05-10 DIAGNOSIS — M3501 Sicca syndrome with keratoconjunctivitis: Secondary | ICD-10-CM

## 2021-05-10 DIAGNOSIS — R7989 Other specified abnormal findings of blood chemistry: Secondary | ICD-10-CM

## 2021-05-10 DIAGNOSIS — Z79899 Other long term (current) drug therapy: Secondary | ICD-10-CM

## 2021-05-10 DIAGNOSIS — F5101 Primary insomnia: Secondary | ICD-10-CM

## 2021-05-10 DIAGNOSIS — I7300 Raynaud's syndrome without gangrene: Secondary | ICD-10-CM

## 2021-06-02 NOTE — Progress Notes (Deleted)
? ?Office Visit Note ? ?Patient: Cynthia Fox             ?Date of Birth: 08-23-1970           ?MRN: [id number]             ?PCP: Colon Branch, MD ?Referring: Colon Branch, MD ?Visit Date: 06/16/2021 ?Occupation: @GUAROCC @ ? ?Subjective:  ?No chief complaint on file. ? ? ?History of Present Illness: Cynthia Fox is a 51 y.o. female ***  ? ?Activities of Daily Living:  ?Patient reports morning stiffness for *** {minute/hour:19697}.   ?Patient {ACTIONS;DENIES/REPORTS:21021675::"Denies"} nocturnal pain.  ?Difficulty dressing/grooming: {ACTIONS;DENIES/REPORTS:21021675::"Denies"} ?Difficulty climbing stairs: {ACTIONS;DENIES/REPORTS:21021675::"Denies"} ?Difficulty getting out of chair: {ACTIONS;DENIES/REPORTS:21021675::"Denies"} ?Difficulty using hands for taps, buttons, cutlery, and/or writing: {ACTIONS;DENIES/REPORTS:21021675::"Denies"} ? ?No Rheumatology ROS completed.  ? ?PMFS History:  ?Patient Active Problem List  ? Diagnosis Date Noted  ?? H. pylori infection, treated 06/2018 09/08/2018  ?? Uriah Philipson' ophthalmopathy 02/01/2017  ?? Annual physical exam 10/24/2016  ?? Menopause 10/13/2016  ?? H/O seasonal allergies 03/23/2016  ?? Vitamin D deficiency 03/23/2016  ?? High risk medication use 03/22/2016  ?? Sjogren's syndrome ([place]) 06/25/2015  ?? PCP NOTES >>>>>>>>>>>>>>>>>>>>>> 06/25/2015  ?? Lakasha Mcfall disease 06/01/2015  ?? Hyperprolactinemia ([place]) 01/12/2015  ?? Elevated antinuclear antibody (ANA) level 07/23/2014  ?  ?Past Medical History:  ?Diagnosis Date  ?? Compressive optic neuropathy   ?? Florette Thai disease   ?? Hepatitis A   ?? Seasonal allergies   ?? Sjogren's disease ([place])   ? positive ANA nad Ro  ?? Thyroid disease   ?? Thyroid eye disease   ?  ?Family History  ?Problem Relation Age of Onset  ?? Hypertension Mother   ?? Retinal detachment Mother   ?? Thyroid disease Mother   ?? Diabetes Father   ?? Thyroid disease Sister   ?? Other Sister   ?     tonsil   ?? Heart attack Maternal Grandfather   ?? Breast cancer Neg Hx    ?? Colon cancer Neg Hx   ?? Stomach cancer Neg Hx   ?? Pancreatic cancer Neg Hx   ?? Esophageal cancer Neg Hx   ? ?Past Surgical History:  ?Procedure Laterality Date  ?? EYE SURGERY Bilateral 2017, 2018  ? release of optic pressure   ?? FUNCTIONAL ENDOSCOPIC SINUS SURGERY Bilateral 05/2015  ?? Orbital Decompression Bilateral 05/2015  ? ?Social History  ? ?Social History Narrative  ? Original from [place]   ? Lives w/ husband  ? ?Immunization History  ?Administered Date(s) Administered  ?? Influenza,inj,Quad PF,6+ Mos 12/24/2018  ?? Influenza-Unspecified 12/19/2019  ?? PFIZER(Purple Top)SARS-COV-2 Vaccination 07/04/2019, 07/25/2019, 03/10/2020  ?? Tdap 07/30/2012  ?  ? ?Objective: ?Vital Signs: LMP 05/25/2016   ? ?Physical Exam  ? ?Musculoskeletal Exam: *** ? ?CDAI Exam: ?CDAI Score: -- ?Patient Global: --; Provider Global: -- ?Swollen: --; Tender: -- ?Joint Exam 06/16/2021  ? ?No joint exam has been documented for this visit  ? ?There is currently no information documented on the homunculus. Go to the Rheumatology activity and complete the homunculus joint exam. ? ?Investigation: ?No additional findings. ? ?Imaging: ?No results found. ? ?Recent Labs: ?Lab Results  ?Component Value Date  ? WBC 5.8 02/17/2021  ? HGB 13.5 02/17/2021  ? PLT 292 02/17/2021  ? NA 139 02/17/2021  ? K 4.6 02/17/2021  ? CL 101 02/17/2021  ? CO2 30 02/17/2021  ? GLUCOSE 91 02/17/2021  ? BUN 15 02/17/2021  ? CREATININE 0.67 02/17/2021  ? BILITOT  0.7 02/17/2021  ? ALKPHOS 129 (H) 06/11/2020  ? AST 27 02/17/2021  ? ALT 29 02/17/2021  ? PROT 7.5 02/17/2021  ? ALBUMIN 4.3 06/11/2020  ? CALCIUM 9.5 02/17/2021  ? GFRAA 117 09/16/2020  ? ? ?Speciality CommentsTherisa Doyne Eye Exam: 01/04/2021 [place] Follow up in 1 year ? ?Procedures:  ?No procedures performed ?Allergies: Patient has no known allergies.  ? ?Assessment / Plan:     ?Visit Diagnoses: No diagnosis found. ? ?Orders: ?No orders of the defined types were placed in this encounter. ? ?No  orders of the defined types were placed in this encounter. ? ? ?Face-to-face time spent with patient was *** minutes. Greater than 50% of time was spent in counseling and coordination of care. ? ?Follow-Up Instructions: No follow-ups on file. ? ? ?Earnestine Mealing, CMA ? ?Note - This record has been created using [employer].  ?Chart creation errors have been sought, but may not always  ?have been located. Such creation errors do not reflect on  ?the standard of medical care.  ?

## 2021-06-16 ENCOUNTER — Ambulatory Visit: Payer: 59 | Admitting: Physician Assistant

## 2021-06-16 DIAGNOSIS — F5101 Primary insomnia: Secondary | ICD-10-CM

## 2021-06-16 DIAGNOSIS — M3501 Sicca syndrome with keratoconjunctivitis: Secondary | ICD-10-CM

## 2021-06-16 DIAGNOSIS — Z79899 Other long term (current) drug therapy: Secondary | ICD-10-CM

## 2021-06-16 DIAGNOSIS — I7300 Raynaud's syndrome without gangrene: Secondary | ICD-10-CM

## 2021-06-16 DIAGNOSIS — R7989 Other specified abnormal findings of blood chemistry: Secondary | ICD-10-CM

## 2021-06-16 DIAGNOSIS — Z8639 Personal history of other endocrine, nutritional and metabolic disease: Secondary | ICD-10-CM

## 2021-07-14 NOTE — Progress Notes (Deleted)
Office Visit Note  Patient: Cynthia Fox             Date of Birth: June 16, 1970           MRN: [id number]             PCP: Wanda Plump, MD Referring: Wanda Plump, MD Visit Date: 07/28/2021 Occupation: @GUAROCC @  Subjective:  No chief complaint on file.   History of Present Illness: Cynthia Fox is a 51 y.o. female ***   Activities of Daily Living:  Patient reports morning stiffness for *** {minute/hour:19697}.   Patient {ACTIONS;DENIES/REPORTS:21021675::"Denies"} nocturnal pain.  Difficulty dressing/grooming: {ACTIONS;DENIES/REPORTS:21021675::"Denies"} Difficulty climbing stairs: {ACTIONS;DENIES/REPORTS:21021675::"Denies"} Difficulty getting out of chair: {ACTIONS;DENIES/REPORTS:21021675::"Denies"} Difficulty using hands for taps, buttons, cutlery, and/or writing: {ACTIONS;DENIES/REPORTS:21021675::"Denies"}  No Rheumatology ROS completed.   PMFS History:  Patient Active Problem List   Diagnosis Date Noted   H. pylori infection, treated 06/2018 09/08/2018   Markian Glockner' ophthalmopathy 02/01/2017   Annual physical exam 10/24/2016   Menopause 10/13/2016   H/O seasonal allergies 03/23/2016   Vitamin D deficiency 03/23/2016   High risk medication use 03/22/2016   Sjogren's syndrome ([facility]) 06/25/2015   PCP NOTES >>>>>>>>>>>>>>>>>>>>>> 06/25/2015   Abie Killian disease 06/01/2015   Hyperprolactinemia ([facility]) 01/12/2015   Elevated antinuclear antibody (ANA) level 07/23/2014    Past Medical History:  Diagnosis Date   Compressive optic neuropathy    Cynthia Fox disease    Hepatitis A    Seasonal allergies    Sjogren's disease ([facility])    positive ANA nad Ro   Thyroid disease    Thyroid eye disease     Family History  Problem Relation Age of Onset   Hypertension Mother    Retinal detachment Mother    Thyroid disease Mother    Diabetes Father    Thyroid disease Sister    Other Sister        tonsil    Heart attack Maternal Grandfather    Breast cancer Neg Hx    Colon cancer Neg Hx     Stomach cancer Neg Hx    Pancreatic cancer Neg Hx    Esophageal cancer Neg Hx    Past Surgical History:  Procedure Laterality Date   EYE SURGERY Bilateral 2017, 2018   release of optic pressure    FUNCTIONAL ENDOSCOPIC SINUS SURGERY Bilateral 05/2015   Orbital Decompression Bilateral 05/2015   Social History   Social History Narrative   Original from 06/2015    Lives w/ husband   Immunization History  Administered Date(s) Administered   Influenza,inj,Quad PF,6+ Mos 12/24/2018   Influenza-Unspecified 12/19/2019   PFIZER(Purple Top)SARS-COV-2 Vaccination 07/04/2019, 07/25/2019, 03/10/2020   Tdap 07/30/2012     Objective: Vital Signs: LMP 05/25/2016    Physical Exam   Musculoskeletal Exam: ***  CDAI Exam: CDAI Score: -- Patient Global: --; Provider Global: -- Swollen: --; Tender: -- Joint Exam 07/28/2021   No joint exam has been documented for this visit   There is currently no information documented on the homunculus. Go to the Rheumatology activity and complete the homunculus joint exam.  Investigation: No additional findings.  Imaging: No results found.  Recent Labs: Lab Results  Component Value Date   WBC 5.8 02/17/2021   HGB 13.5 02/17/2021   PLT 292 02/17/2021   NA 139 02/17/2021   K 4.6 02/17/2021   CL 101 02/17/2021   CO2 30 02/17/2021   GLUCOSE 91 02/17/2021   BUN 15 02/17/2021   CREATININE 0.67 02/17/2021   BILITOT  0.7 02/17/2021   ALKPHOS 129 (H) 06/11/2020   AST 27 02/17/2021   ALT 29 02/17/2021   PROT 7.5 02/17/2021   ALBUMIN 4.3 06/11/2020   CALCIUM 9.5 02/17/2021   GFRAA 117 09/16/2020    Speciality Comments: PLQ Eye Exam: 01/04/2021 WNL@ [facility] Follow up in 1 year  Procedures:  No procedures performed Allergies: Patient has no known allergies.   Assessment / Plan:     Visit Diagnoses: No diagnosis found.  Orders: No orders of the defined types were placed in this encounter.  No orders of the defined types were placed in  this encounter.   Face-to-face time spent with patient was *** minutes. Greater than 50% of time was spent in counseling and coordination of care.  Follow-Up Instructions: No follow-ups on file.   Ellen Henri, CMA  Note - This record has been created using Animal nutritionist.  Chart creation errors have been sought, but may not always  have been located. Such creation errors do not reflect on  the standard of medical care.

## 2021-07-28 ENCOUNTER — Other Ambulatory Visit: Payer: Self-pay | Admitting: *Deleted

## 2021-07-28 ENCOUNTER — Ambulatory Visit: Payer: 59 | Admitting: Physician Assistant

## 2021-07-28 DIAGNOSIS — R7989 Other specified abnormal findings of blood chemistry: Secondary | ICD-10-CM

## 2021-07-28 DIAGNOSIS — Z79899 Other long term (current) drug therapy: Secondary | ICD-10-CM

## 2021-07-28 DIAGNOSIS — F5101 Primary insomnia: Secondary | ICD-10-CM

## 2021-07-28 DIAGNOSIS — M3501 Sicca syndrome with keratoconjunctivitis: Secondary | ICD-10-CM

## 2021-07-28 DIAGNOSIS — I7300 Raynaud's syndrome without gangrene: Secondary | ICD-10-CM

## 2021-07-28 DIAGNOSIS — Z8639 Personal history of other endocrine, nutritional and metabolic disease: Secondary | ICD-10-CM

## 2021-07-28 MED ORDER — HYDROXYCHLOROQUINE SULFATE 200 MG PO TABS: ORAL_TABLET | ORAL | 0 refills | Status: DC

## 2021-07-28 NOTE — Telephone Encounter (Signed)
Refill request received via fax ? ?Next Visit: 07/28/2021 ? ?Last Visit: 02/17/2021 ? ?Labs: 02/17/2021 CBC, CMP and UA are within normal limits.  Eosinophils are mildly elevated and stable which may be due to the allergies. ? ?Eye exam: 01/04/2021 WNL  ? ?Current Dose per office note 02/17/2021: Plaquenil 200 mg 1 tablet by mouth daily. ? ?DX: Sjogren's syndrome with keratoconjunctivitis sicca  ? ?Last Fill: 04/01/2021 ? ?Okay to refill Plaquenil?  ?

## 2021-07-29 NOTE — Progress Notes (Signed)
? ?Office Visit Note ? ?Patient: Cynthia Fox             ?Date of Birth: May 15, 1970           ?MRN: [id number]             ?PCP: Wanda Plump, MD ?Referring: Wanda Plump, MD ?Visit Date: 08/03/2021 ?Occupation: @GUAROCC @ ? ?Subjective:  ?Sicca symptoms  ? ?History of Present Illness: Cynthia Fox is a 51 y.o. female with history of sjogren's syndrome.  She is taking plaquenil 200 mg 1 tablet by mouth daily.  She continues to tolerate Plaquenil without any side effects and has not missed any doses recently.  She has been taking pilocarpine 5 mg twice daily for symptoms of dry mouth.  She has noticed increased sicca symptoms during the winter months which have gradually started to improve.  She has tried using Biotene products in the past and continues to use Restasis on a daily basis for symptomatic relief.  She uses a humidifier in her home during the winter months.  She denies any parotid swelling or tenderness.  She denies any swollen lymph nodes or sores in her mouth or nose.  She has not had any shortness of breath, pleuritic chest pain, or a cough.  She denies any increased joint pain or joint swelling.  She had intermittent symptoms of Raynaud's in the winter months but her symptoms have become less frequent and less severe.  She remains on amlodipine 2.5 mg daily.  She denies any digital ulcerations or signs of gangrene.  She has not noticed any skin tightness or thickening. ? ? ? ? ? ?Activities of Daily Living:  ?Patient reports morning stiffness for 5 minutes.   ?Patient Denies nocturnal pain.  ?Difficulty dressing/grooming: Denies ?Difficulty climbing stairs: Denies ?Difficulty getting out of chair: Denies ?Difficulty using hands for taps, buttons, cutlery, and/or writing: Denies ? ?Review of Systems  ?Constitutional:  Negative for fatigue.  ?HENT:  Positive for mouth dryness and nose dryness. Negative for mouth sores.   ?Eyes:  Positive for dryness. Negative for pain and itching.  ?Respiratory:   Negative for shortness of breath and difficulty breathing.   ?Cardiovascular:  Negative for chest pain and palpitations.  ?Gastrointestinal:  Negative for blood in stool, constipation and diarrhea.  ?Endocrine: Negative for increased urination.  ?Genitourinary:  Negative for difficulty urinating.  ?Musculoskeletal:  Positive for morning stiffness. Negative for joint pain, joint pain, joint swelling, myalgias, muscle tenderness and myalgias.  ?Skin:  Positive for color change. Negative for rash and redness.  ?Allergic/Immunologic: Negative for susceptible to infections.  ?Neurological:  Negative for dizziness, numbness, headaches, memory loss and weakness.  ?Hematological:  Negative for bruising/bleeding tendency.  ?Psychiatric/Behavioral:  Negative for confusion.   ? ?PMFS History:  ?Patient Active Problem List  ? Diagnosis Date Noted  ? H. pylori infection, treated 06/2018 09/08/2018  ? 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 Annual physical exam 10/24/2016  ? Menopause 10/13/2016  ? H/O seasonal allergies 03/23/2016  ? Vitamin D deficiency 03/23/2016  ? High risk medication use 03/22/2016  ? Sjogren's syndrome (HCC) 06/25/2015  ? PCP NOTES >>>>>>>>>>>>>>>>>>>>>> 06/25/2015  ? Graves disease 06/01/2015  ? Hyperprolactinemia (HCC) 01/12/2015  ? Elevated antinuclear antibody (ANA) level 07/23/2014  ?  ?Past Medical History:  ?Diagnosis Date  ? Compressive optic neuropathy   ? Graves disease   ? Hepatitis A   ? Seasonal allergies   ? Sjogren's disease (HCC)   ? positive ANA nad  Ro  ? Thyroid disease   ? Thyroid eye disease   ?  ?Family History  ?Problem Relation Age of Onset  ? Hypertension Mother   ? Retinal detachment Mother   ? Thyroid disease Mother   ? Diabetes Father   ? Thyroid disease Sister   ? Other Sister   ?     tonsil   ? Heart attack Maternal Grandfather   ? Breast cancer Neg Hx   ? Colon cancer Neg Hx   ? Stomach cancer Neg Hx   ? Pancreatic cancer Neg Hx   ? Esophageal cancer Neg Hx   ? ?Past Surgical  History:  ?Procedure Laterality Date  ? EYE SURGERY Bilateral 2017, 2018  ? release of optic pressure   ? FUNCTIONAL ENDOSCOPIC SINUS SURGERY Bilateral 05/2015  ? Orbital Decompression Bilateral 05/2015  ? ?Social History  ? ?Social History Narrative  ? Original from [place]   ? Lives w/ husband  ? ?Immunization History  ?Administered Date(s) Administered  ? Influenza,inj,Quad PF,6+ Mos 12/24/2018  ? Influenza-Unspecified 12/19/2019  ? PFIZER(Purple Top)SARS-COV-2 Vaccination 07/04/2019, 07/25/2019, 03/10/2020  ? Tdap 07/30/2012  ?  ? ?Objective: ?Vital Signs: BP 122/75 (BP Location: Left Arm, Patient Position: Sitting, Cuff Size: Normal)   Pulse 83   Ht 5' (1.524 m)   Wt 150 lb 12.8 oz (68.4 kg)   LMP 05/25/2016   BMI 29.45 kg/m?   ? ?Physical Exam ?Vitals and nursing note reviewed.  ?Constitutional:   ?   Appearance: She is well-developed.  ?HENT:  ?   Head: Normocephalic and atraumatic.  ?   Mouth/Throat:  ?   Comments: No parotid swelling or tenderness ?Eyes:  ?   Conjunctiva/sclera: Conjunctivae normal.  ?Cardiovascular:  ?   Rate and Rhythm: Normal rate and regular rhythm.  ?   Heart sounds: Normal heart sounds.  ?Pulmonary:  ?   Effort: Pulmonary effort is normal.  ?   Breath sounds: Normal breath sounds.  ?Abdominal:  ?   General: Bowel sounds are normal.  ?   Palpations: Abdomen is soft.  ?Musculoskeletal:  ?   Cervical back: Normal range of motion.  ?Lymphadenopathy:  ?   Cervical: No cervical adenopathy.  ?Skin: ?   General: Skin is warm and dry.  ?   Capillary Refill: Capillary refill takes less than 2 seconds.  ?   Comments: No digital ulcerations or signs of sclerodactyly noted.  ?Neurological:  ?   Mental Status: She is alert and oriented to person, place, and time.  ?Psychiatric:     ?   Behavior: Behavior normal.  ?  ? ?Musculoskeletal Exam: C-spine, thoracic spine, lumbar spine have good range of motion with no discomfort.  Shoulder joints, elbow joints, wrist joints, MCPs, PIPs, DIPs have good  range of motion with no synovitis.  Complete fist formation bilaterally.  Hip joints have good range of motion with no groin pain.  Knee joints have good range of motion with no warmth or effusion.  Ankle joints have good range of motion with no tenderness or joint swelling. ? ?CDAI Exam: ?CDAI Score: -- ?Patient Global: --; Provider Global: -- ?Swollen: --; Tender: -- ?Joint Exam 08/03/2021  ? ?No joint exam has been documented for this visit  ? ?There is currently no information documented on the homunculus. Go to the Rheumatology activity and complete the homunculus joint exam. ? ?Investigation: ?No additional findings. ? ?Imaging: ?No results found. ? ?Recent Labs: ?Lab Results  ?Component Value Date  ?  WBC 5.8 02/17/2021  ? HGB 13.5 02/17/2021  ? PLT 292 02/17/2021  ? NA 139 02/17/2021  ? K 4.6 02/17/2021  ? CL 101 02/17/2021  ? CO2 30 02/17/2021  ? GLUCOSE 91 02/17/2021  ? BUN 15 02/17/2021  ? CREATININE 0.67 02/17/2021  ? BILITOT 0.7 02/17/2021  ? ALKPHOS 129 (H) 06/11/2020  ? AST 27 02/17/2021  ? ALT 29 02/17/2021  ? PROT 7.5 02/17/2021  ? ALBUMIN 4.3 06/11/2020  ? CALCIUM 9.5 02/17/2021  ? GFRAA 117 09/16/2020  ? ? ?Speciality CommentsMila Merry: PLQ Eye Exam: 01/04/2021 WNL@ [facility] Follow up in 1 year ? ?Procedures:  ?No procedures performed ?Allergies: Patient has no known allergies.  ? ?Assessment / Plan:     ?Visit Diagnoses: Sjogren's syndrome with keratoconjunctivitis sicca (HCC) - Positive ANA, positive Ro, positive rheumatoid factor, negative CCP: She continues to have chronic sicca symptoms which are typically worse during the winter months.  She has been using Restasis on a daily basis and has tried using Biotene products in the past.  She remains on pilocarpine 5 mg twice daily.  She has been having some breakthrough symptoms so she was encouraged to try increasing to 3 times daily as needed for symptomatic relief.  She uses a humidifier in her home which has been helpful.  She has no parotid  swelling or tenderness on examination today.  She has not had any oral or nasal ulcerations.  No cervical lymphadenopathy was palpable.  She has no signs of inflammatory arthritis at this time.  Overall she is clinically doi

## 2021-08-03 ENCOUNTER — Ambulatory Visit (INDEPENDENT_AMBULATORY_CARE_PROVIDER_SITE_OTHER): Payer: 59 | Admitting: Physician Assistant

## 2021-08-03 ENCOUNTER — Encounter: Payer: Self-pay | Admitting: Physician Assistant

## 2021-08-03 VITALS — BP 122/75 | HR 83 | Ht 60.0 in | Wt 150.8 lb

## 2021-08-03 DIAGNOSIS — R7989 Other specified abnormal findings of blood chemistry: Secondary | ICD-10-CM | POA: Diagnosis not present

## 2021-08-03 DIAGNOSIS — M3501 Sicca syndrome with keratoconjunctivitis: Secondary | ICD-10-CM | POA: Diagnosis not present

## 2021-08-03 DIAGNOSIS — Z8639 Personal history of other endocrine, nutritional and metabolic disease: Secondary | ICD-10-CM

## 2021-08-03 DIAGNOSIS — Z79899 Other long term (current) drug therapy: Secondary | ICD-10-CM

## 2021-08-03 DIAGNOSIS — F5101 Primary insomnia: Secondary | ICD-10-CM

## 2021-08-03 DIAGNOSIS — I7300 Raynaud's syndrome without gangrene: Secondary | ICD-10-CM | POA: Diagnosis not present

## 2021-08-06 LAB — SJOGRENS SYNDROME-A EXTRACTABLE NUCLEAR ANTIBODY: SSA (Ro) (ENA) Antibody, IgG: >8 AI — AB

## 2021-08-06 LAB — COMPLETE METABOLIC PANEL WITH GFR
AG Ratio: 1.6 (calc) (ref 1.0–2.5)
ALT: 51 U/L — ABNORMAL HIGH (ref 6–29)
AST: 33 U/L (ref 10–35)
Albumin: 4.4 g/dL (ref 3.6–5.1)
Alkaline phosphatase (APISO): 170 U/L — ABNORMAL HIGH (ref 37–153)
BUN: 11 mg/dL (ref 7–25)
CO2: 29 mmol/L (ref 20–32)
Calcium: 9.2 mg/dL (ref 8.6–10.4)
Chloride: 102 mmol/L (ref 98–110)
Creat: 0.68 mg/dL (ref 0.50–1.03)
Globulin: 2.8 g/dL (calc) (ref 1.9–3.7)
Glucose, Bld: 92 mg/dL (ref 65–99)
Potassium: 4.4 mmol/L (ref 3.5–5.3)
Sodium: 138 mmol/L (ref 135–146)
Total Bilirubin: 0.7 mg/dL (ref 0.2–1.2)
Total Protein: 7.2 g/dL (ref 6.1–8.1)
eGFR: 104 mL/min/{1.73_m2} (ref >=60)

## 2021-08-06 LAB — ANTI-NUCLEAR AB-TITER (ANA TITER): ANA Titer 1: 1:1280 {titer} — ABNORMAL HIGH

## 2021-08-06 LAB — URINALYSIS, ROUTINE W REFLEX MICROSCOPIC
Bilirubin Urine: NEGATIVE
Glucose, UA: NEGATIVE
Hgb urine dipstick: NEGATIVE
Ketones, ur: NEGATIVE
Leukocytes,Ua: NEGATIVE
Nitrite: NEGATIVE
Protein, ur: NEGATIVE
Specific Gravity, Urine: 1.005 (ref 1.001–1.035)
pH: 6.5 (ref 5.0–8.0)

## 2021-08-06 LAB — SEDIMENTATION RATE: Sed Rate: 17 mm/h (ref 0–30)

## 2021-08-06 LAB — CBC WITH DIFFERENTIAL/PLATELET
Absolute Monocytes: 452 cells/uL (ref 200–950)
Basophils Absolute: 62 cells/uL (ref 0–200)
Basophils Relative: 1.2 %
Eosinophils Absolute: 681 cells/uL — ABNORMAL HIGH (ref 15–500)
Eosinophils Relative: 13.1 %
HCT: 39.6 % (ref 35.0–45.0)
Hemoglobin: 13.2 g/dL (ref 11.7–15.5)
Lymphs Abs: 2049 cells/uL (ref 850–3900)
MCH: 33.8 pg — ABNORMAL HIGH (ref 27.0–33.0)
MCHC: 33.3 g/dL (ref 32.0–36.0)
MCV: 101.5 fL — ABNORMAL HIGH (ref 80.0–100.0)
MPV: 11.2 fL (ref 7.5–12.5)
Monocytes Relative: 8.7 %
Neutro Abs: 1955 cells/uL (ref 1500–7800)
Neutrophils Relative %: 37.6 %
Platelets: 317 10*3/uL (ref 140–400)
RBC: 3.9 10*6/uL (ref 3.80–5.10)
RDW: 12.6 % (ref 11.0–15.0)
Total Lymphocyte: 39.4 %
WBC: 5.2 10*3/uL (ref 3.8–10.8)

## 2021-08-06 LAB — PROTEIN ELECTROPHORESIS, SERUM, WITH REFLEX
Albumin ELP: 4.1 g/dL (ref 3.8–4.8)
Alpha 1: 0.2 g/dL (ref 0.2–0.3)
Alpha 2: 0.7 g/dL (ref 0.5–0.9)
Beta 2: 0.5 g/dL (ref 0.2–0.5)
Beta Globulin: 0.6 g/dL (ref 0.4–0.6)
Gamma Globulin: 1 g/dL (ref 0.8–1.7)
Total Protein: 7.1 g/dL (ref 6.1–8.1)

## 2021-08-06 LAB — C3 AND C4
C3 Complement: 135 mg/dL (ref 83–193)
C4 Complement: 32 mg/dL (ref 15–57)

## 2021-08-06 LAB — ANA: Anti Nuclear Antibody (ANA): POSITIVE — AB

## 2021-08-06 LAB — RHEUMATOID FACTOR: Rheumatoid fact SerPl-aCnc: <14 IU/mL (ref <14)

## 2021-08-06 NOTE — Progress Notes (Signed)
ANA and Ro antibody remain positive.   ?Alk phos is slightly elevated. ALT is also elevated.  Please clarify if she has been taking any tylenol, NSAIDs, or alcohol use? ?CBC stable.  ?UA normal.  ?Complements Wnl. ESR WNL.  RF negative.  SPEP did not reveal any abnormal protein bands.

## 2021-10-07 ENCOUNTER — Other Ambulatory Visit: Payer: Self-pay | Admitting: *Deleted

## 2021-10-07 DIAGNOSIS — M3501 Sicca syndrome with keratoconjunctivitis: Secondary | ICD-10-CM

## 2021-10-07 MED ORDER — HYDROXYCHLOROQUINE SULFATE 200 MG PO TABS: ORAL_TABLET | ORAL | 0 refills | Status: DC

## 2021-10-07 NOTE — Telephone Encounter (Signed)
Next Visit: 01/04/2022  Last Visit: 08/03/2021  Labs: 08/03/2021 Alk phos is slightly elevated. ALT is also elevated. CBC stable.  Eye exam: 01/04/2021 WNL   Current Dose per office note 08/03/2021: Plaquenil 200 mg 1 tablet by mouth daily  DX: Sjogren's syndrome with keratoconjunctivitis sicca   Last Fill: 07/28/2021  Okay to refill Plaquenil?

## 2021-12-22 NOTE — Progress Notes (Deleted)
Office Visit Note  Patient: Cynthia Fox             Date of Birth: 11/09/1970           MRN: [id number]             PCP: Wanda Plump, MD Referring: Wanda Plump, MD Visit Date: 01/04/2022 Occupation: @GUAROCC @  Subjective:  No chief complaint on file.   History of Present Illness: Cynthia Fox is a 51 y.o. female ***   Activities of Daily Living:  Patient reports morning stiffness for *** {minute/hour:19697}.   Patient {ACTIONS;DENIES/REPORTS:21021675::"Denies"} nocturnal pain.  Difficulty dressing/grooming: {ACTIONS;DENIES/REPORTS:21021675::"Denies"} Difficulty climbing stairs: {ACTIONS;DENIES/REPORTS:21021675::"Denies"} Difficulty getting out of chair: {ACTIONS;DENIES/REPORTS:21021675::"Denies"} Difficulty using hands for taps, buttons, cutlery, and/or writing: {ACTIONS;DENIES/REPORTS:21021675::"Denies"}  No Rheumatology ROS completed.   PMFS History:  Patient Active Problem List   Diagnosis Date Noted  . H. pylori infection, treated 06/2018 09/08/2018  . Adalynn Corne' ophthalmopathy 02/01/2017  . Annual physical exam 10/24/2016  . Menopause 10/13/2016  . H/O seasonal allergies 03/23/2016  . Vitamin D deficiency 03/23/2016  . High risk medication use 03/22/2016  . Sjogren's syndrome (HCC) 06/25/2015  . PCP NOTES >>>>>>>>>>>>>>>>>>>>>> 06/25/2015  . 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 Hyperprolactinemia (HCC) 01/12/2015  . Elevated antinuclear antibody (ANA) level 07/23/2014    Past Medical History:  Diagnosis Date  . Compressive optic neuropathy   . Maliek Schellhorn disease   . Hepatitis A   . Seasonal allergies   . Sjogren's disease (HCC)    positive ANA nad Ro  . Thyroid disease   . Thyroid eye disease     Family History  Problem Relation Age of Onset  . Hypertension Mother   . Retinal detachment Mother   . Thyroid disease Mother   . Diabetes Father   . Thyroid disease Sister   . Other Sister        tonsil   . Heart attack Maternal Grandfather   . Breast cancer Neg Hx    . Colon cancer Neg Hx   . Stomach cancer Neg Hx   . Pancreatic cancer Neg Hx   . Esophageal cancer Neg Hx    Past Surgical History:  Procedure Laterality Date  . EYE SURGERY Bilateral 2017, 2018   release of optic pressure   . FUNCTIONAL ENDOSCOPIC SINUS SURGERY Bilateral 05/2015  . Orbital Decompression Bilateral 05/2015   Social History   Social History Narrative   Original from 06/2015    Lives w/ husband   Immunization History  Administered Date(s) Administered  . Influenza,inj,Quad PF,6+ Mos 12/24/2018  . Influenza-Unspecified 12/19/2019  . PFIZER(Purple Top)SARS-COV-2 Vaccination 07/04/2019, 07/25/2019, 03/10/2020  . Tdap 07/30/2012     Objective: Vital Signs: LMP 05/16/2016    Physical Exam   Musculoskeletal Exam: ***  CDAI Exam: CDAI Score: -- Patient Global: --; Provider Global: -- Swollen: --; Tender: -- Joint Exam 01/04/2022   No joint exam has been documented for this visit   There is currently no information documented on the homunculus. Go to the Rheumatology activity and complete the homunculus joint exam.  Investigation: No additional findings.  Imaging: No results found.  Recent Labs: Lab Results  Component Value Date   WBC 5.2 08/03/2021   HGB 13.2 08/03/2021   PLT 317 08/03/2021   NA 138 08/03/2021   K 4.4 08/03/2021   CL 102 08/03/2021   CO2 29 08/03/2021   GLUCOSE 92 08/03/2021   BUN 11 08/03/2021   CREATININE 0.68 08/03/2021   BILITOT  0.7 08/03/2021   ALKPHOS 129 (H) 06/11/2020   AST 33 08/03/2021   ALT 51 (H) 08/03/2021   PROT 7.2 08/03/2021   PROT 7.1 08/03/2021   ALBUMIN 4.3 06/11/2020   CALCIUM 9.2 08/03/2021   GFRAA 117 09/16/2020    Speciality Comments: PLQ Eye Exam: 01/04/2021 WNL@ [facility] Follow up in 1 year  Procedures:  No procedures performed Allergies: Patient has no known allergies.   Assessment / Plan:     Visit Diagnoses: No diagnosis found.  Orders: No orders of the defined types were placed in  this encounter.  No orders of the defined types were placed in this encounter.   Face-to-face time spent with patient was *** minutes. Greater than 50% of time was spent in counseling and coordination of care.  Follow-Up Instructions: No follow-ups on file.   Ellen Henri, CMA  Note - This record has been created using Animal nutritionist.  Chart creation errors have been sought, but may not always  have been located. Such creation errors do not reflect on  the standard of medical care.

## 2021-12-23 ENCOUNTER — Ambulatory Visit: Payer: 59 | Admitting: Internal Medicine

## 2021-12-28 ENCOUNTER — Encounter: Payer: 59 | Admitting: Internal Medicine

## 2021-12-31 ENCOUNTER — Ambulatory Visit: Payer: 59 | Admitting: Internal Medicine

## 2021-12-31 NOTE — Progress Notes (Deleted)
Patient ID: Cynthia Fox, female   DOB: [birth year], 51 y.o.   MRN: [id number]  HPI  Cynthia Fox is a 51 y.o.-year-old female, initially referred by Dr Lily Peer, returning for h/o Graves ds, Graves ophthalmopathy, and elevated prolactin. Last visit 1 year ago.  Interim history: She has no tremors, palpitations, heat intolerance. She continues to have dry mouth and dry eyes.  She is on hydroxychloroquine and pilocarpine for her Sjogren's syndrome.  H/o Graves ds:  Reviewed history: Pt had a thyroid uptake in 08/08/2014 (unfortunately, a scan was not done at that time) and this returned at ULN, 28.4% (10-30%).  We started MMI and the dose was increased to 10 mg in am and 5 mg in pm >> TSH high  (as she did not return for recheck as advised) >> we stopped MMi >> TSH 0.09 >> advised her to restart MMi 5 mg daily >> dose decreased to 2.5 mg in 08/2015.  She is off methimazole since 09/2015.  Her Graves' antibodies normalized: Lab Results  Component Value Date   TSI <89 12/20/2019   TSI <89 12/05/2018   TSI <89 08/07/2017   TSI 351 (H) 07/29/2016   TSI 471 (H) 01/29/2016   TSI 247 (H) 08/14/2015   TSI 297 (H) 09/16/2014   Reviewed her TFTs: Lab Results  Component Value Date   TSH 2.85 12/22/2020   TSH 2.26 12/20/2019   TSH 1.57 12/05/2018   TSH 1.69 02/06/2018   TSH 1.78 08/07/2017   TSH 1.60 02/01/2017   TSH 2.80 07/29/2016   TSH 1.13 05/12/2016   TSH 1.53 04/08/2016   TSH 0.810 01/29/2016   FREET4 0.74 12/22/2020   FREET4 1.01 12/20/2019   FREET4 1.18 12/05/2018   FREET4 0.77 02/06/2018   FREET4 0.78 08/07/2017   FREET4 0.84 02/01/2017   FREET4 1.1 07/29/2016   FREET4 0.81 05/12/2016   FREET4 1.03 04/08/2016   FREET4 1.31 01/29/2016   Pt denies: - feeling nodules in neck - hoarseness - dysphagia - choking  Graves ophthalmopathy: Reviewed history: 05/18/2015:  Called by Dr. Dione Booze: Patient seen in his office recently for regular eye exam while on Plaquenil.  She noticed a visual field defect in one of her eyes and had her come back for repeat. The second visual field was worse. He obtained an MRI of her brain with focus on the pituitary that showed normal brain structures and pituitary gland, however ocular muscles were enlarged. The third visual field was even worse than the previous two. She was referred to [facility] ophthal[facility], where she is seeing Dr. Toni Arthurs. She is planning to perform decompression surgery. However, due to the severity of the visual field defects, she recommended pulse steroids with Solu-Medrol. She can do this in our office, however patient would like to have this locally. Dr. Dione Booze called me to see if this can be arranged.   The recommended Solu-Medrol regimen: - 500 mg once a week for 6 weeks, followed by - 250 mg once a week for the next 6 weeks.  I d/w Dr Toni Arthurs and plan was for pt to have decompressive surgery first >> she had this 05/21/2015, and then started the above Solumedrol regimen - last dose 08/2015.   She had significant diplopia in the past that she could only drive with corrective glasses.  However, after her strabismus surgery in 10/2016, she has very little double vision only when looking to the left.  She does not need prism glasses anymore.  She started to feel that she cannot see very well at the distance.  She continues to see ophthalmology  Elevated prolactin (normal: 2.8-29.2)   Reviewed prolactin levels: Lab Results  Component Value Date   PROLACTIN 9.8 12/22/2020   PROLACTIN 8.7 12/20/2019   PROLACTIN 12.9 12/05/2018   PROLACTIN 10.0 08/07/2017   PROLACTIN 11.3 07/29/2016   PROLACTIN 28.5 09/25/2015   PROLACTIN 30.9 01/12/2015   PROLACTIN 39.9 07/22/2014   PROLACTIN 40.3 07/16/2014   Previously: Component     Latest Ref Rng 09/16/2014  Prolactin, Total      28.0  Prolactin, Monomeric     3.2 - 25.2 ng/mL 23.0   No headaches or galactorrhea.  She also has Sjogren Sd.  She is  seeing Dr. Pamella Pert.  ROS: + see HPI  I reviewed pt's medications, allergies, PMH, social hx, family hx, and changes were documented in the history of present illness. Otherwise, unchanged from my initial visit note.  Past Medical History:  Diagnosis Date   Compressive optic neuropathy    Graves disease    Hepatitis A    Seasonal allergies    Sjogren's disease (HCC)    positive ANA nad Ro   Thyroid disease    Thyroid eye disease    Past Surgical History:  Procedure Laterality Date   EYE SURGERY Bilateral 2017, 2018   release of optic pressure    FUNCTIONAL ENDOSCOPIC SINUS SURGERY Bilateral 05/2015   Orbital Decompression Bilateral 05/2015    History   Social History   Marital Status: Married    Spouse Name: N/A   Number of Children: no   Occupational History    Teaching laboratory technician    Social History Main Topics   Smoking status: Never Smoker    Smokeless tobacco: Never Used   Alcohol Use: Yes     Comment: weekends   Current Outpatient Medications on File Prior to Visit  Medication Sig Dispense Refill   amLODipine (NORVASC) 2.5 MG tablet Take 1 tablet (2.5 mg total) by mouth daily. 30 tablet 2   calcium carbonate (OS-CAL - DOSED IN MG OF ELEMENTAL CALCIUM) 1250 (500 Ca) MG tablet Take 1 tablet by mouth.     hydroxychloroquine (PLAQUENIL) 200 MG tablet TAKE 1 TABLET BY MOUTH EVERY DAY 90 tablet 0   pilocarpine (SALAGEN) 5 MG tablet Take 1 tablet (5 mg total) by mouth 2 (two) times daily as needed. 180 tablet 0   RESTASIS 0.05 % ophthalmic emulsion 1 drop 2 (two) times daily.     VITAMIN D PO Take by mouth 2 (two) times a week.     No current facility-administered medications on file prior to visit.   No Known Allergies   Family History  Problem Relation Age of Onset   Hypertension Mother    Retinal detachment Mother    Thyroid disease Mother    Diabetes Father    Thyroid disease Sister    Other Sister        tonsil    Heart attack Maternal Grandfather    Breast  cancer Neg Hx    Colon cancer Neg Hx    Stomach cancer Neg Hx    Pancreatic cancer Neg Hx    Esophageal cancer Neg Hx   - also see history of present illness  PE: LMP 05/16/2016  There is no height or weight on file to calculate BMI. Wt Readings from Last 3 Encounters:  08/03/21 150 lb 12.8 oz (68.4 kg)  02/25/21 152 lb (68.9  kg)  02/17/21 153 lb 12.8 oz (69.8 kg)   Constitutional: normal weight, in NAD Eyes:  EOMI, no exophthalmos ENT: no neck masses, no cervical lymphadenopathy Cardiovascular: RRR, No MRG Respiratory: CTA B Musculoskeletal: no deformities Skin:no rashes Neurological: no tremor with outstretched hands  ASSESSMENT: 1. Mild Graves ds.  2. Graves ophthalmopathy - had Solumedrol - 500 mg weekly x 6 doses, then 250 mg weekly x 6 doses - last in 08/2015.   3. Elevated prolactin  PLAN:  1. Patient with mild Graves' disease, based on the presence of TSI antibodies and her uptake and scan, however, with very significant Graves' ophthalmopathy, now improved. -Previously on methimazole but was able to come off it in 09/2015 -Remains asymptomatic -Please Qsymia on selenium, but taken off all supplements after LFTs were abnormal in the past -At the last visit TFTs were normal -We will repeat her TFTs today -We discussed about possibly continuing to see PCP for this going forward  2. GO  -She has a history of significant diplopia, for which we gave her serial Solu-Medrol injections and she has eye surgeries (decompression surgery 05/2015, strabismus surgery in 10/2016-successful) and corrective eyeglasses.  Now she has dry eyes and feels that she cannot see very well at a distance, but no other complaints at this visit -Last check, TSI antibodies were within normal range -We will not repeat her TSI's today -she continues to see ophthalmology  3. Elevated PRL  -Patient with previously high prolactin levels, but normalized in the last 6 years, without any  medications -At last visit, prolactin level was 9.8, normal -We ruled out macro prolactin -She is postmenopausal -No headaches or galactorrhea -no further prolactin checks needed  Philemon Kingdom, MD PhD [facility]

## 2022-01-04 ENCOUNTER — Ambulatory Visit: Payer: 59 | Attending: Rheumatology | Admitting: Rheumatology

## 2022-01-04 DIAGNOSIS — Z8639 Personal history of other endocrine, nutritional and metabolic disease: Secondary | ICD-10-CM

## 2022-01-04 DIAGNOSIS — Z79899 Other long term (current) drug therapy: Secondary | ICD-10-CM

## 2022-01-04 DIAGNOSIS — M3501 Sicca syndrome with keratoconjunctivitis: Secondary | ICD-10-CM

## 2022-01-04 DIAGNOSIS — F5101 Primary insomnia: Secondary | ICD-10-CM

## 2022-01-04 DIAGNOSIS — I7300 Raynaud's syndrome without gangrene: Secondary | ICD-10-CM

## 2022-01-04 DIAGNOSIS — R7989 Other specified abnormal findings of blood chemistry: Secondary | ICD-10-CM

## 2022-01-11 LAB — HM MAMMOGRAPHY

## 2022-01-12 ENCOUNTER — Other Ambulatory Visit: Payer: Self-pay | Admitting: *Deleted

## 2022-01-12 DIAGNOSIS — M3501 Sicca syndrome with keratoconjunctivitis: Secondary | ICD-10-CM

## 2022-01-12 MED ORDER — HYDROXYCHLOROQUINE SULFATE 200 MG PO TABS: ORAL_TABLET | ORAL | 0 refills | Status: DC

## 2022-01-12 NOTE — Telephone Encounter (Signed)
Refill request received via fax from [facility] for Cynthia Fox.   Next Visit: 01/04/2022   Last Visit: 08/03/2021   Labs: 08/03/2021 Alk phos is slightly elevated. ALT is also elevated. CBC stable.   Eye exam: 01/04/2021 WNL    Current Dose per office note 08/03/2021: Plaquenil 200 mg 1 tablet by mouth daily   DX: Sjogren's syndrome with keratoconjunctivitis sicca    Last Fill: 10/07/2021  Left message to advise patient she is due to update her PLQ eye exam.    Okay to refill Plaquenil?

## 2022-01-14 ENCOUNTER — Encounter: Payer: Self-pay | Admitting: Internal Medicine

## 2022-01-17 ENCOUNTER — Telehealth: Payer: Self-pay | Admitting: Internal Medicine

## 2022-01-17 ENCOUNTER — Encounter: Payer: Self-pay | Admitting: Internal Medicine

## 2022-01-17 ENCOUNTER — Ambulatory Visit (INDEPENDENT_AMBULATORY_CARE_PROVIDER_SITE_OTHER): Payer: 59 | Admitting: Internal Medicine

## 2022-01-17 VITALS — BP 126/84 | HR 90 | Temp 98.2°F | Resp 16 | Ht 60.0 in | Wt 159.1 lb

## 2022-01-17 DIAGNOSIS — Z0000 Encounter for general adult medical examination without abnormal findings: Secondary | ICD-10-CM | POA: Diagnosis not present

## 2022-01-17 DIAGNOSIS — Z780 Asymptomatic menopausal state: Secondary | ICD-10-CM

## 2022-01-17 DIAGNOSIS — R7989 Other specified abnormal findings of blood chemistry: Secondary | ICD-10-CM

## 2022-01-17 NOTE — Assessment & Plan Note (Signed)
-  Td 2014  -Shingrix #1 08/2021, plans to proceed #2 - C-19 vaccines recommended - s/p  Flu shot  - female care per gyn,  MM[facility] 02-2021, just had a f/u [facility], no results. --CCS: Negative cologuard 01/05/2020 -labs: CMP, GGT, FLP, CBC, folic acid -Diet and exercise:   - H-POA d/w pt  -Menopausal for at least 6 years, on vitamin D, check DEXA

## 2022-01-17 NOTE — Telephone Encounter (Signed)
[facility] pharmacy calling to inform us that patient has only received one shingles shot and that was on 09/01/21. They received fax from our office.

## 2022-01-17 NOTE — Telephone Encounter (Signed)
Noted, immunization record updated.

## 2022-01-17 NOTE — Assessment & Plan Note (Signed)
Here for [facility] Endocrinology: Has an appointment pending with Dr. Cruzita Lederer . Sjogren's, + ANA: Sees rheumatology regularly. Abnormal CBC: Increased MCV, B12 levels normal, check a folic acid. Increased LFTs: CMP and GGT. Not on  daily tylenol, no etoh (rarely) RTC 1 year

## 2022-01-17 NOTE — Progress Notes (Signed)
Subjective:    Patient ID: Cynthia Fox, female    DOB: [birth year], 51 y.o.   MRN: [id number]  DOS:  01/17/2022 Type of visit - description: CPX  Since the last office visit a year ago she is doing well and has no major concerns.  Review of Systems  A 14 point review of systems is negative    Past Medical History:  Diagnosis Date   Compressive optic neuropathy    Graves disease    Hepatitis A    Seasonal allergies    Sjogren's disease (HCC)    positive ANA nad Ro   Thyroid disease    Thyroid eye disease     Past Surgical History:  Procedure Laterality Date   EYE SURGERY Bilateral 2017, 2018   release of optic pressure    FUNCTIONAL ENDOSCOPIC SINUS SURGERY Bilateral 05/2015   Orbital Decompression Bilateral 05/2015   Social History   Socioeconomic History   Marital status: Married    Spouse name: Not on file   Number of children: 0   Years of education: Not on file   Highest education level: Not on file  Occupational History   Occupation: shipping office   Tobacco Use   Smoking status: Never    Passive exposure: Never   Smokeless tobacco: Never  Vaping Use   Vaping Use: Never used  Substance and Sexual Activity   Alcohol use: Yes    Comment: weekends occ   Drug use: No   Sexual activity: Yes    Partners: Male    Birth control/protection: Post-menopausal    Comment: 1st intercourse- 21, partners- 4, married- 18 yrs   Other Topics Concern   Not on file  Social History Narrative   Original from [place]    Lives w/ husband   Social Determinants of Health   Financial Resource Strain: Not on file  Food Insecurity: Not on file  Transportation Needs: Not on file  Physical Activity: Not on file  Stress: Not on file  Social Connections: Not on file  Intimate Partner Violence: Not on file     Current Outpatient Medications  Medication Instructions   amLODipine (NORVASC) 2.5 mg, Oral, Daily   calcium carbonate (OS-CAL - DOSED IN MG OF ELEMENTAL CALCIUM)  1250 (500 Ca) MG tablet 1 tablet, Oral   hydroxychloroquine (PLAQUENIL) 200 MG tablet TAKE 1 TABLET BY MOUTH EVERY DAY   pilocarpine (SALAGEN) 5 mg, Oral, 2 times daily PRN   RESTASIS 0.05 % ophthalmic emulsion 1 drop, 2 times daily   VITAMIN D PO Oral, 2 times weekly       Objective:   Physical Exam BP 126/84   Pulse 90   Temp 98.2 F (36.8 C) (Oral)   Resp 16   Ht 5' (1.524 m)   Wt 159 lb 2 oz (72.2 kg)   LMP 05/16/2016   SpO2 97%   BMI 31.08 kg/m  General: Well developed, NAD, BMI noted Neck: No  thyromegaly  HEENT:  Normocephalic . Face symmetric, atraumatic Lungs:  CTA B Normal respiratory effort, no intercostal retractions, no accessory muscle use. Heart: RRR,  no murmur.  Abdomen:  Not distended, soft, non-tender. No rebound or rigidity.   Lower extremities: no pretibial edema bilaterally  Skin: Exposed areas without rash. Not pale. Not jaundice Neurologic:  alert & oriented X3.  Speech normal, gait appropriate for age and unassisted Strength symmetric and appropriate for age.  Psych: Cognition and judgment appear intact.  Cooperative with normal attention  span and concentration.  Behavior appropriate. No anxious or depressed appearing.     Assessment     Assessment ENDO:    Dr Cruzita Lederer --Berenice Primas' disease dx 2016, ophthalmopathy dx ~ 04-2015 , had diplopia, s/p orbital decompression surgery ~ 05-2015   --h/o hyperprolactinemia  RHEUM:  Dr Herold Harms  Sjogren syndrome  Dx 2016  --  + ANA Menopausal 2017   PLAN: Here for CPX Endocrinology: Has an appointment pending with Dr. Cruzita Lederer . Sjogren's, + ANA: Sees rheumatology regularly. Abnormal CBC: Increased MCV, B12 levels normal, check a folic acid. Increased LFTs: CMP and GGT. Not on  daily tylenol, no etoh (rarely) RTC 1 year

## 2022-01-17 NOTE — Patient Instructions (Addendum)
Proceed with your Shingrix No. 2 Proceed with the COVID-vaccine  GO TO THE LAB : Get the blood work     [place], [place] back for   a physical exam in 1 year      Advanced care planning:  Do you have a "Living will", "[place] of attorney" ?   If you already have a living will or healthcare power of attorney, is recommended you bring the copy to be scanned in your chart. The document will be available to all the doctors you see in the system.  If you don't have one, please consider create one.  Advance care planning is a process that supports adults in  understanding and sharing their preferences regarding future medical care.   The patient's preferences are recorded in documents called Advance Directives.    Advanced directives are completed (and can be modified at any time) while the patient is in full mental capacity.   The documentation should be available at all times to the patient, the family and the healthcare providers.   This legal documents direct treatment decision making and/or appoint a surrogate to make the decision if the patient is not capable to do so.    Advance directives can be documented in many types of formats,  documents have names such as:  Lliving will  Durable power of attorney for healthcare (healthcare proxy or healthcare power of attorney)  Combined directives  Physician orders for life-sustaining treatment    More information at:  [email or web address]

## 2022-01-18 ENCOUNTER — Telehealth: Payer: Self-pay

## 2022-01-18 LAB — COMPREHENSIVE METABOLIC PANEL
ALT: 38 U/L — ABNORMAL HIGH (ref 0–35)
AST: 37 U/L (ref 0–37)
Albumin: 4.3 g/dL (ref 3.5–5.2)
Alkaline Phosphatase: 142 U/L — ABNORMAL HIGH (ref 39–117)
BUN: 14 mg/dL (ref 6–23)
CO2: 30 mEq/L (ref 19–32)
Calcium: 9.2 mg/dL (ref 8.4–10.5)
Chloride: 103 mEq/L (ref 96–112)
Creatinine, Ser: 0.58 mg/dL (ref 0.40–1.20)
GFR: 103.07 mL/min (ref >60.00)
Glucose, Bld: 99 mg/dL (ref 70–99)
Potassium: 4.3 mEq/L (ref 3.5–5.1)
Sodium: 141 mEq/L (ref 135–145)
Total Bilirubin: 0.7 mg/dL (ref 0.2–1.2)
Total Protein: 7.4 g/dL (ref 6.0–8.3)

## 2022-01-18 LAB — CBC WITH DIFFERENTIAL/PLATELET
Basophils Absolute: 0 10*3/uL (ref 0.0–0.1)
Basophils Relative: 0.8 % (ref 0.0–3.0)
Eosinophils Absolute: 0.5 10*3/uL (ref 0.0–0.7)
Eosinophils Relative: 10.1 % — ABNORMAL HIGH (ref 0.0–5.0)
HCT: 38.2 % (ref 36.0–46.0)
Hemoglobin: 12.9 g/dL (ref 12.0–15.0)
Lymphocytes Relative: 34.4 % (ref 12.0–46.0)
Lymphs Abs: 1.7 10*3/uL (ref 0.7–4.0)
MCHC: 33.9 g/dL (ref 30.0–36.0)
MCV: 99.8 fl (ref 78.0–100.0)
Monocytes Absolute: 0.5 10*3/uL (ref 0.1–1.0)
Monocytes Relative: 10.4 % (ref 3.0–12.0)
Neutro Abs: 2.2 10*3/uL (ref 1.4–7.7)
Neutrophils Relative %: 44.3 % (ref 43.0–77.0)
Platelets: 236 10*3/uL (ref 150.0–400.0)
RBC: 3.83 Mil/uL — ABNORMAL LOW (ref 3.87–5.11)
RDW: 13.6 % (ref 11.5–15.5)
WBC: 5 10*3/uL (ref 4.0–10.5)

## 2022-01-18 LAB — LIPID PANEL
Cholesterol: 189 mg/dL (ref 0–200)
HDL: 65.8 mg/dL (ref >39.00)
LDL Cholesterol: 103 mg/dL — ABNORMAL HIGH (ref 0–99)
NonHDL: 122.85
Total CHOL/HDL Ratio: 3
Triglycerides: 99 mg/dL (ref 0.0–149.0)
VLDL: 19.8 mg/dL (ref 0.0–40.0)

## 2022-01-18 LAB — FOLATE: Folate: 13.6 ng/mL (ref >5.9)

## 2022-01-18 LAB — GAMMA GT: GGT: 178 U/L — ABNORMAL HIGH (ref 7–51)

## 2022-01-18 NOTE — Telephone Encounter (Signed)
Physical form completed andfaxed back to [place] at [phone]. Form sent for scanning.     Received fax confirmation.

## 2022-01-20 ENCOUNTER — Ambulatory Visit (HOSPITAL_BASED_OUTPATIENT_CLINIC_OR_DEPARTMENT_OTHER)
Admission: RE | Admit: 2022-01-20 | Discharge: 2022-01-20 | Disposition: A | Discharge status: Home / Self Care | Payer: 59 | Source: Ambulatory Visit | Attending: Internal Medicine | Admitting: Internal Medicine

## 2022-01-20 DIAGNOSIS — Z780 Asymptomatic menopausal state: Secondary | ICD-10-CM | POA: Diagnosis present

## 2022-01-21 ENCOUNTER — Encounter: Payer: Self-pay | Admitting: Internal Medicine

## 2022-01-31 NOTE — Progress Notes (Signed)
Office Visit Note  Patient: Cynthia Fox             Date of Birth: 27-Dec-1970           MRN: [id number]             PCP: Colon Branch, MD Referring: Colon Branch, MD Visit Date: 02/14/2022 Occupation: @GUAROCC @  Subjective:  Pain in both shoulders   History of Present Illness: Cynthia Fox is a 51 y.o. female with history of Sjogren's syndrome.  Patient is taking Plaquenil 200 mg 1 tablet daily.  She is tolerating Plaquenil without any side effects and has not missed any doses recently.  Patient reports that she has been experiencing increased pain in both shoulder joints intermittently for the past few months.  She has not had any recent injury prior to the onset of symptoms.  Her shoulder pain is worse with repetitive activities as well as at night.  She denies any other joint pain or joint swelling at this time.  She continues to experience ongoing sicca symptoms.  She uses Restasis eyedrops and takes pilocarpine 5 mg twice daily for symptomatic relief.  She has upcoming appointment with her ophthalmologist in February 2024.  She continues to see her dentist every 6 months.  She has had increased frequency of Raynaud's in her fingertips with colder weather temperatures.  She denies any digital ulcerations or signs of gangrene. She had an updated bone density on 01/20/2022.  She continues to take a calcium vitamin D supplement daily.  She has not had any recent falls or fractures.    Activities of Daily Living:  Patient reports morning stiffness for a few minutes.   Patient Denies nocturnal pain.  Difficulty dressing/grooming: Denies Difficulty climbing stairs: Denies Difficulty getting out of chair: Denies Difficulty using hands for taps, buttons, cutlery, and/or writing: Denies  Review of Systems  Constitutional:  Negative for fatigue.  HENT:  Positive for mouth dryness. Negative for mouth sores.   Eyes:  Positive for dryness.  Respiratory:  Negative for shortness of breath.    Cardiovascular:  Negative for chest pain and palpitations.  Gastrointestinal:  Negative for blood in stool, constipation and diarrhea.  Endocrine: Negative for increased urination.  Genitourinary:  Negative for involuntary urination.  Musculoskeletal:  Positive for joint pain, joint pain, myalgias, morning stiffness and myalgias. Negative for gait problem, joint swelling, muscle weakness and muscle tenderness.  Skin:  Positive for color change. Negative for rash, hair loss and sensitivity to sunlight.  Allergic/Immunologic: Negative for susceptible to infections.  Neurological:  Positive for numbness. Negative for dizziness and headaches.  Hematological:  Negative for swollen glands.  Psychiatric/Behavioral:  Positive for sleep disturbance. Negative for depressed mood. The patient is not nervous/anxious.     PMFS History:  Patient Active Problem List   Diagnosis Date Noted   H. 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   Annual physical exam 10/24/2016   Menopause 10/13/2016   H/O seasonal allergies 03/23/2016   Vitamin D deficiency 03/23/2016   High risk medication use 03/22/2016   Sjogren's syndrome ([place]) 06/25/2015   PCP NOTES >>>>>>>>>>>>>>>>>>>>>> 06/25/2015   Graves disease 06/01/2015   Hyperprolactinemia ([place]) 01/12/2015   Elevated antinuclear antibody (ANA) level 07/23/2014    Past Medical History:  Diagnosis Date   Compressive optic neuropathy    Graves disease    Hepatitis A    Seasonal allergies    Sjogren's disease ([place])  positive ANA nad Ro   Thyroid disease    Thyroid eye disease     Family History  Problem Relation Age of Onset   Hypertension Mother    Retinal detachment Mother    Thyroid disease Mother    Diabetes Father    Thyroid disease Sister    Other Sister        tonsil    Heart attack Maternal Grandfather        57   Breast cancer Neg Hx    Colon cancer Neg Hx    Stomach cancer Neg Hx    Pancreatic  cancer Neg Hx    Esophageal cancer Neg Hx    Past Surgical History:  Procedure Laterality Date   EYE SURGERY Bilateral 2017, 2018   release of optic pressure    FUNCTIONAL ENDOSCOPIC SINUS SURGERY Bilateral 05/2015   Orbital Decompression Bilateral 05/2015   Social History   Social History Narrative   Original from [employer]    Lives w/ husband   Immunization History  Administered Date(s) Administered   Influenza,inj,Quad PF,6+ Mos 12/24/2018   Influenza-Unspecified 12/19/2019, 12/10/2021   PFIZER(Purple Top)SARS-COV-2 Vaccination 07/04/2019, 07/25/2019, 03/10/2020   Tdap 07/30/2012   Zoster Recombinat (Shingrix) 09/01/2021     Objective: Vital Signs: BP 132/80 (BP Location: Left Arm, Patient Position: Sitting, Cuff Size: Normal)   Pulse 80   Resp 14   Ht 5' (1.524 m)   Wt 165 lb 6.4 oz (75 kg)   LMP 05/16/2016   BMI 32.30 kg/m    Physical Exam Vitals and nursing note reviewed.  Constitutional:      Appearance: She is well-developed.  HENT:     Head: Normocephalic and atraumatic.  Eyes:     Conjunctiva/sclera: Conjunctivae normal.     Comments: Right eye pterygium noted  Cardiovascular:     Rate and Rhythm: Normal rate and regular rhythm.     Heart sounds: Normal heart sounds.  Pulmonary:     Effort: Pulmonary effort is normal.     Breath sounds: Normal breath sounds.  Abdominal:     General: Bowel sounds are normal.     Palpations: Abdomen is soft.  Musculoskeletal:     Cervical back: Normal range of motion.  Skin:    General: Skin is warm and dry.     Capillary Refill: Capillary refill takes less than 2 seconds.     Comments: No signs of sclerodactyly.  Neurological:     Mental Status: She is alert and oriented to person, place, and time.  Psychiatric:        Behavior: Behavior normal.      Musculoskeletal Exam: C-spine, thoracic spine, and lumbar spine have good ROM.  Trapezius muscle tension and tenderness bilaterally.  Shoulder joints have good ROM with  some discomfort bilaterally.  Elbow joints, wrist joints, MCPs, PIPs, and DIPs good ROM with no synovitis.  Complete fist formation bilaterally.  Hip joints have good ROM with no groin pain.  Knee joints have good ROM with no warmth or effusion.  Ankle joints have good ROM with no tenderness or joint swelling.   CDAI Exam: CDAI Score: -- Patient Global: --; Provider Global: -- Swollen: --; Tender: -- Joint Exam 02/14/2022   No joint exam has been documented for this visit   There is currently no information documented on the homunculus. Go to the Rheumatology activity and complete the homunculus joint exam.  Investigation: No additional findings.  Imaging: DG Bone Density  Result Date:  01/20/2022 EXAM: DUAL X-RAY ABSORPTIOMETRY (DXA) FOR BONE MINERAL DENSITY IMPRESSION: JOSE E PAZ Your patient Reah Justo completed a BMD test on 01/20/2022 using the [place] (analysis version: 16.SP2) manufactured by [employer]. The following summarizes the results of our evaluation. [place] PATIENT: Name: Senetra, Dillin Patient ID: [id number] Birth Date: 11-11-1970 Height: 58.0 in. Gender: Female Measured: 01/20/2022 Weight: 158.6 lbs. Indications: Early Menopause, Estrogen Deficiency, Graves Disease, Height Loss, Post Menopausal Fractures: Treatments: Vitamin D ASSESSMENT: The BMD measured at Femur Neck Left is 0.825 g/cm2 with a T-score of -1.5. This patient is considered osteopenic according to [facility]) criteria. The scan quality is good. Site Region Measured Date Measured Age WHO YA BMD Classification T-score AP Spine L1-L4 01/20/2022 53.7 Normal -0.5 1.117 g/cm2 DualFemur Neck Left 1/20/2022 53.7 years Low Bone Mass -1.5 0.825 g/cm2 World Health Organization [facility]) criteria for post-menopausal, Caucasian Women: Normal        T-score at or above -1 SD Low Bone Mass T-score between -1 and -2.5 SD Osteoporosis  T-score at or below -2.5 SD RECOMMENDATION: 1. All patients  should optimize calcium and vitamin D intake. 2. Consider FDA-approved medical therapies in postmenopausal women and men aged 16 years and older, based on the following: a. A hip or vertebral(clinical or morphometric) fracture. b. T-Score < -2.5 at the femoral neck or spine after appropriate evaluation to exclude secondary causes c. Low bone mass (T-score between -1.0 and -2.5 at the femoral neck or spine) and a 10 year probability of a hip fracture >3% or a 10 year probability of major osteoporosis-related fracture > 20% based on the [place]-adapted WHO algorithm d. Clinical judgement and/or patient preferences may indicate treatment for people with 10-year fracture probabilities above or below these levels FOLLOW-UP: Patients with diagnosis of osteoporosis or at high risk for fracture should have regular bone mineral density tests. For patients eligible for Medicare, routine testing is allowed once every 2 years. The testing frequency can be increased to one year for patients who have rapidly progressing disease, those who are receiving or discontinuing medical therapy to restore bone mass, or have additional risk factors. I have reviewed this report and agree with the above findings. [facility] Radiology Electronically Signed   By: Ammie Ferrier M.D.   On: 01/20/2022 14:37    Recent Labs: Lab Results  Component Value Date   WBC 5.0 01/17/2022   HGB 12.9 01/17/2022   PLT 236.0 01/17/2022   NA 141 01/17/2022   K 4.3 01/17/2022   CL 103 01/17/2022   CO2 30 01/17/2022   GLUCOSE 99 01/17/2022   BUN 14 01/17/2022   CREATININE 0.58 01/17/2022   BILITOT 0.7 01/17/2022   ALKPHOS 142 (H) 01/17/2022   AST 37 01/17/2022   ALT 38 (H) 01/17/2022   PROT 7.4 01/17/2022   ALBUMIN 4.3 01/17/2022   CALCIUM 9.2 01/17/2022   GFRAA 117 09/16/2020    Speciality Comments: PLQ Eye Exam: 01/04/2021 [place] Follow up in 1 year (02/07/2022: patient states that she is trying to get an eye exam scheduled  with [facility])  Procedures:  No procedures performed Allergies: Patient has no known allergies.     Assessment / Plan:     Visit Diagnoses: Sjogren's syndrome with keratoconjunctivitis sicca (HCC) - Positive ANA, positive Ro, positive rheumatoid factor, negative CCP: She continues to have chronic sicca symptoms.  She is using Restasis eyedrops and pilocarpine 5 mg 1 tablet twice daily for symptomatic  relief.  She continues to see the dentist every 6 months and sees her ophthalmologist on a yearly basis.  Her next appointment at [facility] eye care is in February 2024.  She continues to take Plaquenil 200 mg 1 tablet by mouth daily.  She is tolerating Plaquenil without any side effects.  She has no signs of inflammatory arthritis at this time.  No synovitis was noted.  She has been experiencing some increased discomfort in both shoulder joints but has good range of motion on examination today.  Sed rate will be ordered for further evaluation as long with x-rays of both shoulders today.  She declined physical therapy at this time.  She was advised to notify us if her symptoms persist or worsen. She is not experiencing any shortness of breath, cough, or pleuritic chest pain at this time. Lab work from 08/03/2021 was reviewed today in the office: ANA 1:1280 nuclear, centromere, Ro >8, SPEP normal, RF negative, ESR WNL, complements WNL.  CBC and CMP updated on 01/17/2022 and results were reviewed with the patient today in the office.  SPEP, UA, sed rate, and complements were ordered today for further evaluation. Discussed the 4-14 fold increased risk for lymphoma in patients with Sjogren's syndrome. She will remain on Plaquenil and pilocarpine as prescribed.  Refills were sent to the pharmacy today.  She was advised to notify us if she develops any new or worsening symptoms.  She will follow-up in the office in 5 months or sooner if needed. - Plan: pilocarpine (SALAGEN) 5 MG tablet, hydroxychloroquine  (PLAQUENIL) 200 MG tablet, C3 and C4, Sedimentation rate, Urinalysis, Routine w reflex microscopic, Serum protein electrophoresis with reflex  High risk medication use - Plaquenil 200 mg 1 tablet by mouth daily.  CBC and CMP were drawn on 01/17/2022.  Reviewed results with the patient today in the office.  She will continue to require updated lab work every 5 months to monitor for drug toxicity. PLQ Eye Exam: 01/04/2021 [place] Follow up in 1 year.  Patient is scheduled for an updated Plaquenil eye examination in February 2024.  She was given a Plaquenil eye examination form to take with her to her upcoming appointment.  Raynaud's disease without gangrene - She has started to experience increased symptoms of Raynaud's phenomenon with the colder weather temperatures.  No digital ulcerations or signs of gangrene were noted on examination today.  She continues to take amlodipine 2.5 mg daily. No signs of sclerodactyly noted on examination today.   Elevated LFTs - She had GI evaluation in the past. ALT 38-stable. AST WNL on 01/17/22.   Chronic pain of both shoulders -Patient presents today with increased pain in both shoulder joints.  She has had intermittent discomfort in both shoulders for the past few months with no injury prior to the onset of symptoms.  Her symptoms are exacerbated by repetitive activity and she has had occasional discomfort at night when lying on her sides.  She has good range of motion of both shoulder joints on examination today with some discomfort bilaterally.  X-rays of both shoulders were updated today.  ESR was also ordered.  Offered a referral to physical therapy but she declined at this time.  A handout of home exercises was provided to the patient.  She was advised to notify us if her symptoms persist or worsen. Plan: XR Shoulder Right, XR Shoulder Left  Osteopenia of multiple sites: DEXA updated on 01/20/22:  The BMD measured at Femur Neck Left  is 0.825 g/cm2 with  a T-score of -1.5.  She is taking calcium  1200 mg daily and vitamin D 5,000 units twice weekly.  Discussed the importance of performing resistive exercises.  History of vitamin D deficiency: She is taking vitamin D 5,000 units twice weekly.   Other medical conditions are listed as follows:   Primary insomnia  History of Graves' disease     Orders: Orders Placed This Encounter  Procedures   XR Shoulder Right   XR Shoulder Left   C3 and C4   Sedimentation rate   Urinalysis, Routine w reflex microscopic   Serum protein electrophoresis with reflex   Meds ordered this encounter  Medications   pilocarpine (SALAGEN) 5 MG tablet    Sig: Take 1 tablet (5 mg total) by mouth 2 (two) times daily as needed.    Dispense:  180 tablet    Refill:  0   hydroxychloroquine (PLAQUENIL) 200 MG tablet    Sig: Take 1 tablet (200 mg total) by mouth daily.    Dispense:  90 tablet    Refill:  0      Follow-Up Instructions: Return in about 5 months (around 07/16/2022) for Sjogren's syndrome.   Ofilia Neas, PA-C  Note - This record has been created using Dragon software.  Chart creation errors have been sought, but may not always  have been located. Such creation errors do not reflect on  the standard of medical care.

## 2022-02-07 ENCOUNTER — Other Ambulatory Visit: Payer: Self-pay | Admitting: Rheumatology

## 2022-02-07 DIAGNOSIS — M3501 Sicca syndrome with keratoconjunctivitis: Secondary | ICD-10-CM

## 2022-02-07 NOTE — Telephone Encounter (Signed)
Next Visit: 01/04/2022   Last Visit: 08/03/2021   Labs: 01/17/2022 RBC 3.83, Eosinophils Relative 10.1, Alkaline Phosphatase 142, ALT 38    Eye exam: 01/04/2021 WNL    Current Dose per office note 08/03/2021: Plaquenil 200 mg 1 tablet by mouth daily   DX: Sjogren's syndrome with keratoconjunctivitis sicca    Last Fill: 01/12/2022 (30 day supply)   Left message to advise patient she is due to update her PLQ eye exam.    Okay to refill Plaquenil?

## 2022-02-14 ENCOUNTER — Ambulatory Visit (INDEPENDENT_AMBULATORY_CARE_PROVIDER_SITE_OTHER): Payer: 59

## 2022-02-14 ENCOUNTER — Encounter: Payer: Self-pay | Admitting: Physician Assistant

## 2022-02-14 ENCOUNTER — Ambulatory Visit: Payer: 59 | Attending: Physician Assistant | Admitting: Physician Assistant

## 2022-02-14 VITALS — BP 132/80 | HR 80 | Resp 14 | Ht 60.0 in | Wt 165.4 lb

## 2022-02-14 DIAGNOSIS — R7989 Other specified abnormal findings of blood chemistry: Secondary | ICD-10-CM | POA: Diagnosis not present

## 2022-02-14 DIAGNOSIS — M25511 Pain in right shoulder: Secondary | ICD-10-CM

## 2022-02-14 DIAGNOSIS — G8929 Other chronic pain: Secondary | ICD-10-CM | POA: Diagnosis not present

## 2022-02-14 DIAGNOSIS — I7300 Raynaud's syndrome without gangrene: Secondary | ICD-10-CM | POA: Diagnosis not present

## 2022-02-14 DIAGNOSIS — M3501 Sicca syndrome with keratoconjunctivitis: Secondary | ICD-10-CM

## 2022-02-14 DIAGNOSIS — Z79899 Other long term (current) drug therapy: Secondary | ICD-10-CM | POA: Diagnosis not present

## 2022-02-14 DIAGNOSIS — M25512 Pain in left shoulder: Secondary | ICD-10-CM

## 2022-02-14 DIAGNOSIS — Z8639 Personal history of other endocrine, nutritional and metabolic disease: Secondary | ICD-10-CM

## 2022-02-14 DIAGNOSIS — F5101 Primary insomnia: Secondary | ICD-10-CM

## 2022-02-14 DIAGNOSIS — M8589 Other specified disorders of bone density and structure, multiple sites: Secondary | ICD-10-CM

## 2022-02-14 MED ORDER — PILOCARPINE HCL 5 MG PO TABS: 5.0000 mg | ORAL_TABLET | Freq: Two times a day (BID) | ORAL | 0 refills | Status: DC | PRN

## 2022-02-14 MED ORDER — HYDROXYCHLOROQUINE SULFATE 200 MG PO TABS: 200.0000 mg | ORAL_TABLET | Freq: Every day | ORAL | 0 refills | Status: DC

## 2022-02-14 NOTE — Patient Instructions (Signed)

## 2022-02-15 NOTE — Progress Notes (Signed)
ESR and complements WNL.  UA normal.

## 2022-02-15 NOTE — Progress Notes (Signed)
X-rays of both shoulders are unremarkable.  Please notify the patient.   She will notify us if she develops increased joint pain.

## 2022-02-23 LAB — C3 AND C4
C3 Complement: 159 mg/dL (ref 83–193)
C4 Complement: 34 mg/dL (ref 15–57)

## 2022-02-23 LAB — URINALYSIS, ROUTINE W REFLEX MICROSCOPIC
Bilirubin Urine: NEGATIVE
Glucose, UA: NEGATIVE
Hgb urine dipstick: NEGATIVE
Ketones, ur: NEGATIVE
Leukocytes,Ua: NEGATIVE
Nitrite: NEGATIVE
Protein, ur: NEGATIVE
Specific Gravity, Urine: 1.008 (ref 1.001–1.035)
pH: 7 (ref 5.0–8.0)

## 2022-02-23 LAB — PROTEIN ELECTROPHORESIS, SERUM, WITH REFLEX
Albumin ELP: 4.5 g/dL (ref 3.8–4.8)
Alpha 1: 0.3 g/dL (ref 0.2–0.3)
Alpha 2: 0.7 g/dL (ref 0.5–0.9)
Beta 2: 0.6 g/dL — ABNORMAL HIGH (ref 0.2–0.5)
Beta Globulin: 0.6 g/dL (ref 0.4–0.6)
Gamma Globulin: 1 g/dL (ref 0.8–1.7)
Total Protein: 7.7 g/dL (ref 6.1–8.1)

## 2022-02-23 LAB — SEDIMENTATION RATE: Sed Rate: 19 mm/h (ref 0–30)

## 2022-02-23 LAB — IFE INTERPRETATION

## 2022-02-23 NOTE — Progress Notes (Signed)
IFE: no monoclonal proteins detected

## 2022-03-01 ENCOUNTER — Ambulatory Visit: Payer: 59 | Admitting: Obstetrics & Gynecology

## 2022-03-21 ENCOUNTER — Encounter: Payer: Self-pay | Admitting: Internal Medicine

## 2022-03-21 ENCOUNTER — Ambulatory Visit (INDEPENDENT_AMBULATORY_CARE_PROVIDER_SITE_OTHER): Payer: 59 | Admitting: Internal Medicine

## 2022-03-21 VITALS — BP 138/78 | HR 93 | Ht 60.0 in | Wt 164.6 lb

## 2022-03-21 DIAGNOSIS — E221 Hyperprolactinemia: Secondary | ICD-10-CM

## 2022-03-21 DIAGNOSIS — E0500 Thyrotoxicosis with diffuse goiter without thyrotoxic crisis or storm: Secondary | ICD-10-CM

## 2022-03-21 NOTE — Progress Notes (Unsigned)
Patient ID: Rebeca Allegra, female   DOB: 04-11-1971, 51 y.o.   MRN: [id number]   HPI  SHIRLENA BRINEGAR is a 51 y.o.-year-old female, initially referred by Dr Lily Peer, returning for Graves ds, Graves ophthalmopathy, and elevated prolactin. Last visit 1 year and 3 months ago.  Interim history: Before last visit, she lost 10 pounds after adjusting her diet since last OV after she was found to have elevated LFTs.  Since then, she gained approximately 14 pounds back. She has no tremors, palpitations, heat intolerance. She continues to have dry mouth and dry eyes.  She is on hydroxychloroquine and pilocarpine for her Sjogren's syndrome.  Graves ds:  Reviewed history: Pt had a thyroid uptake in 08/08/2014 (unfortunately, a scan was not done at that time) and this returned at ULN, 28.4% (10-30%).  We started MMI and the dose was increased to 10 mg in am and 5 mg in pm >> TSH high  (as she did not return for recheck as advised) >> we stopped MMi >> TSH 0.09 >> advised her to restart MMi 5 mg daily >> dose decreased to 2.5 mg in 08/2015.  She is off methimazole since 09/2015.  Her Graves' antibodies normalized: Lab Results  Component Value Date   TSI <89 12/20/2019   TSI <89 12/05/2018   TSI <89 08/07/2017   TSI 351 (H) 07/29/2016   TSI 471 (H) 01/29/2016   TSI 247 (H) 08/14/2015   TSI 297 (H) 09/16/2014   Reviewed her TFTs: Lab Results  Component Value Date   TSH 2.85 12/22/2020   TSH 2.26 12/20/2019   TSH 1.57 12/05/2018   TSH 1.69 02/06/2018   TSH 1.78 08/07/2017   TSH 1.60 02/01/2017   TSH 2.80 07/29/2016   TSH 1.13 05/12/2016   TSH 1.53 04/08/2016   TSH 0.810 01/29/2016   FREET4 0.74 12/22/2020   FREET4 1.01 12/20/2019   FREET4 1.18 12/05/2018   FREET4 0.77 02/06/2018   FREET4 0.78 08/07/2017   FREET4 0.84 02/01/2017   FREET4 1.1 07/29/2016   FREET4 0.81 05/12/2016   FREET4 1.03 04/08/2016   FREET4 1.31 01/29/2016   Pt denies: - feeling nodules in neck - hoarseness -  dysphagia - choking  Graves ophthalmopathy: Reviewed history: 05/18/2015:  Called by Dr. Dione Booze: Patient seen in his office recently for regular eye exam while on Plaquenil. She noticed a visual field defect in one of her eyes and had her come back for repeat. The second visual field was worse. He obtained an MRI of her brain with focus on the pituitary that showed normal brain structures and pituitary gland, however ocular muscles were enlarged. The third visual field was even worse than the previous two. She was referred to [facility] ophthalmology, where she is seeing Dr. Toni Arthurs. She is planning to perform decompression surgery. However, due to the severity of the visual field defects, she recommended pulse steroids with Solu-Medrol. She can do this in our office, however patient would like to have this locally. Dr. Dione Booze called me to see if this can be arranged.   The recommended Solu-Medrol regimen: - 500 mg once a week for 6 weeks, followed by - 250 mg once a week for the next 6 weeks.  I d/w Dr Toni Arthurs and plan was for pt to have decompressive surgery first >> she had this 05/21/2015, and then started the above Solumedrol regimen - last dose 08/2015.   She had significant diplopia in the past that she could only drive with  corrective glasses.  However, after her strabismus surgery in 10/2016, she has very little double vision only when looking to the left.  She does not need prism glasses anymore.    She started to feel that she cannot see very well at the distance.  She follows up with ophthalmology. She has dry eyes.  Elevated prolactin (normal: 2.8-29.2)   Reviewed prolactin levels: Lab Results  Component Value Date   PROLACTIN 9.8 12/22/2020   PROLACTIN 8.7 12/20/2019   PROLACTIN 12.9 12/05/2018   PROLACTIN 10.0 08/07/2017   PROLACTIN 11.3 07/29/2016   PROLACTIN 28.5 09/25/2015   PROLACTIN 30.9 01/12/2015   PROLACTIN 39.9 07/22/2014   PROLACTIN 40.3 07/16/2014    Previously: Component     Latest Ref Rng 09/16/2014  Prolactin, Total      28.0  Prolactin, Monomeric     3.2 - 25.2 ng/mL 23.0   No headaches or galactorrhea.  She also has a dx. Of Sjogren Sd.   ROS: + see HPI  I reviewed pt's medications, allergies, PMH, social hx, family hx, and changes were documented in the history of present illness. Otherwise, unchanged from my initial visit note.  Past Medical History:  Diagnosis Date   Compressive optic neuropathy    Graves disease    Hepatitis A    Seasonal allergies    Sjogren's disease (HCC)    positive ANA nad Ro   Thyroid disease    Thyroid eye disease    Past Surgical History:  Procedure Laterality Date   EYE SURGERY Bilateral 2017, 2018   release of optic pressure    FUNCTIONAL ENDOSCOPIC SINUS SURGERY Bilateral 05/2015   Orbital Decompression Bilateral 05/2015    History   Social History   Marital Status: Married    Spouse Name: N/A   Number of Children: no   Occupational History    Teaching laboratory technician    Social History Main Topics   Smoking status: Never Smoker    Smokeless tobacco: Never Used   Alcohol Use: Yes     Comment: weekends   Current Outpatient Medications on File Prior to Visit  Medication Sig Dispense Refill   amLODipine (NORVASC) 2.5 MG tablet Take 1 tablet (2.5 mg total) by mouth daily. 30 tablet 2   calcium carbonate (OS-CAL - DOSED IN MG OF ELEMENTAL CALCIUM) 1250 (500 Ca) MG tablet Take 1 tablet by mouth.     hydroxychloroquine (PLAQUENIL) 200 MG tablet Take 1 tablet (200 mg total) by mouth daily. 90 tablet 0   pilocarpine (SALAGEN) 5 MG tablet Take 1 tablet (5 mg total) by mouth 2 (two) times daily as needed. 180 tablet 0   RESTASIS 0.05 % ophthalmic emulsion 1 drop 2 (two) times daily.     VITAMIN D PO Take by mouth 2 (two) times a week.     No current facility-administered medications on file prior to visit.   No Known Allergies   Family History  Problem Relation Age of Onset    Hypertension Mother    Retinal detachment Mother    Thyroid disease Mother    Diabetes Father    Thyroid disease Sister    Other Sister        tonsil    Heart attack Maternal Grandfather        6   Breast cancer Neg Hx    Colon cancer Neg Hx    Stomach cancer Neg Hx    Pancreatic cancer Neg Hx    Esophageal cancer Neg Hx   -  also see history of present illness  PE: LMP 05/16/2016  There is no height or weight on file to calculate BMI. Wt Readings from Last 3 Encounters:  02/14/22 165 lb 6.4 oz (75 kg)  01/17/22 159 lb 2 oz (72.2 kg)  08/03/21 150 lb 12.8 oz (68.4 kg)   Constitutional: + Slightly overweight, in NAD Eyes:  EOMI, no exophthalmos ENT: no neck masses, no cervical lymphadenopathy Cardiovascular: RRR, No MRG Respiratory: CTA B Musculoskeletal: no deformities Skin:no rashes Neurological: no tremor with outstretched hands  ASSESSMENT: 1. Mild Graves ds.  2. Graves ophthalmopathy - had Solumedrol - 500 mg weekly x 6 doses, then 250 mg weekly x 6 doses - last in 08/2015.   3. Elevated prolactin  PLAN:  1. Patient with mild Graves' disease, based on the presence of TSI antibodies and her uptake and scan, however, with very significant Graves' ophthalmopathy, now improved. -She was previously on methimazole but was able to come off in 09/2015 -She remains asymptomatic, but she gained approximately 14 pounds since last visit (she weighed 151 lbs then) -She was previously on selenium-taken off all supplements after LFTs increased.  Her LFTs normalized since, but we are keeping her off selenium -Will check her thyroid tests today -If these are normal, I suggested to continue to see PCP with only annual TSH levels and come back to see me if this becomes abnormal.  She agrees with the plan.  2. GO  -No new complaints at today's visit -She has a history of significant diplopia, for which we gave her serial Solu-Medrol injections and she has eye surgeries  (decompression surgery 05/2015, strabismus surgery in 10/2016-successful) and corrective eyeglasses.  Now she only has dry eyes and feels that she cannot see very well at the distance for this reason. -At last check, TSI's were in the target range -we do not need to repeat these -She continues to follow-up with ophthalmology  3. Elevated PRL  -Patient with previously high prolactin levels, normalized in the last 6 years, not on any medications -We ruled out macro prolactin -She is postmenopausal now -No headaches or galactorrhea -No need to repeat her prolactin level going forward  Carlus Pavlov, MD PhD [facility] Endocrinology

## 2022-03-21 NOTE — Patient Instructions (Addendum)
Please stop at the lab.  Please continue to see Dr. Drue Novel from now on - with a TSH checked during your annual physical exam. Come back if this becomes abnormal again.

## 2022-03-22 LAB — TSH: TSH: 2.17 u[IU]/mL (ref 0.35–5.50)

## 2022-03-22 LAB — T4, FREE: Free T4: 0.86 ng/dL (ref 0.60–1.60)

## 2022-03-22 LAB — T3, FREE: T3, Free: 3.3 pg/mL (ref 2.3–4.2)

## 2022-04-12 ENCOUNTER — Ambulatory Visit: Payer: 59 | Admitting: Obstetrics & Gynecology

## 2022-04-18 NOTE — Progress Notes (Deleted)
Office Visit Note  Patient: Cynthia Fox             Date of Birth: Apr 28, 1970           MRN: [id number]             PCP: Colon Branch, MD Referring: Colon Branch, MD Visit Date: 04/21/2022 Occupation: @GUAROCC$ @  Subjective:  No chief complaint on file.   History of Present Illness: Cynthia Fox is a 52 y.o. female ***     Activities of Daily Living:  Patient reports morning stiffness for *** {minute/hour:19697}.   Patient {ACTIONS;DENIES/REPORTS:21021675::"Denies"} nocturnal pain.  Difficulty dressing/grooming: {ACTIONS;DENIES/REPORTS:21021675::"Denies"} Difficulty climbing stairs: {ACTIONS;DENIES/REPORTS:21021675::"Denies"} Difficulty getting out of chair: {ACTIONS;DENIES/REPORTS:21021675::"Denies"} Difficulty using hands for taps, buttons, cutlery, and/or writing: {ACTIONS;DENIES/REPORTS:21021675::"Denies"}  No Rheumatology ROS completed.   PMFS History:  Patient Active Problem List   Diagnosis Date Noted   H. 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   Hyperprolactinemia ([place]) 01/12/2015   Elevated antinuclear antibody (ANA) level 07/23/2014    Past Medical History:  Diagnosis Date   Compressive optic neuropathy    Polette Nofsinger disease    Hepatitis A    Seasonal allergies    Sjogren's disease ([place])    positive ANA nad Ro   Thyroid disease    Thyroid eye disease     Family History  Problem Relation Age of Onset   Hypertension Mother    Retinal detachment Mother    Thyroid disease Mother    Diabetes Father    Thyroid disease Sister    Other Sister        tonsil    Heart attack Maternal Grandfather        66   Breast cancer Neg Hx    Colon  cancer Neg Hx    Stomach cancer Neg Hx    Pancreatic cancer Neg Hx    Esophageal cancer Neg Hx    Past Surgical History:  Procedure Laterality Date   EYE SURGERY Bilateral 2017, 2018   release of optic pressure    FUNCTIONAL ENDOSCOPIC SINUS SURGERY Bilateral 05/2015   Orbital Decompression Bilateral 05/2015   Social History   Social History Narrative   Original from [employer]    Lives w/ husband   Immunization History  Administered Date(s) Administered   Influenza,inj,Quad PF,6+ Mos 12/24/2018   Influenza-Unspecified 12/19/2019, 12/10/2021   PFIZER(Purple Top)SARS-COV-2 Vaccination 07/04/2019, 07/25/2019, 03/10/2020   Tdap 07/30/2012   Zoster Recombinat (Shingrix) 09/01/2021     Objective: Vital Signs: LMP 05/16/2016    Physical Exam   Musculoskeletal Exam: ***  CDAI Exam: CDAI Score: -- Patient Global: --; Provider Global: -- Swollen: --; Tender: -- Joint Exam 04/21/2022   No joint exam has been documented for this visit   There is currently no information documented on the homunculus. Go to the Rheumatology activity and complete the homunculus joint exam.  Investigation: No additional findings.  Imaging: No results found.  Recent Labs: Lab Results  Component Value Date   WBC 5.0 01/17/2022   HGB 12.9 01/17/2022   PLT 236.0 01/17/2022   NA 141 01/17/2022   K 4.3 01/17/2022   CL 103 01/17/2022   CO2 30 01/17/2022  GLUCOSE 99 01/17/2022   BUN 14 01/17/2022   CREATININE 0.58 01/17/2022   BILITOT 0.7 01/17/2022   ALKPHOS 142 (H) 01/17/2022   AST 37 01/17/2022   ALT 38 (H) 01/17/2022   PROT 7.7 02/14/2022   ALBUMIN 4.3 01/17/2022   CALCIUM 9.2 01/17/2022   GFRAA 117 09/16/2020    Speciality Comments: PLQ Eye Exam: 01/04/2021 [place] Follow up in 1 year (02/07/2022: patient states that she is trying to get an eye exam [facility] with [facility])  Procedures:  No procedures performed Allergies: Patient has no known allergies.    Assessment / Plan:     Visit Diagnoses: No diagnosis found.  Orders: No orders of the defined types were placed in this encounter.  No orders of the defined types were placed in this encounter.   Face-to-face time spent with patient was *** minutes. Greater than 50% of time was spent in counseling and coordination of care.  Follow-Up Instructions: No follow-ups on file.   Earnestine Mealing, CMA  Note - This record has been created using Editor, commissioning.  Chart creation errors have been sought, but may not always  have been located. Such creation errors do not reflect on  the standard of medical care.

## 2022-04-21 ENCOUNTER — Ambulatory Visit: Payer: 59 | Admitting: Physician Assistant

## 2022-04-21 DIAGNOSIS — G8929 Other chronic pain: Secondary | ICD-10-CM

## 2022-04-21 DIAGNOSIS — M3501 Sicca syndrome with keratoconjunctivitis: Secondary | ICD-10-CM

## 2022-04-21 DIAGNOSIS — R7989 Other specified abnormal findings of blood chemistry: Secondary | ICD-10-CM

## 2022-04-21 DIAGNOSIS — Z79899 Other long term (current) drug therapy: Secondary | ICD-10-CM

## 2022-04-21 DIAGNOSIS — F5101 Primary insomnia: Secondary | ICD-10-CM

## 2022-04-21 DIAGNOSIS — M8589 Other specified disorders of bone density and structure, multiple sites: Secondary | ICD-10-CM

## 2022-04-21 DIAGNOSIS — I7300 Raynaud's syndrome without gangrene: Secondary | ICD-10-CM

## 2022-04-21 DIAGNOSIS — Z8639 Personal history of other endocrine, nutritional and metabolic disease: Secondary | ICD-10-CM

## 2022-04-27 ENCOUNTER — Other Ambulatory Visit: Payer: Self-pay | Admitting: *Deleted

## 2022-04-27 DIAGNOSIS — M3501 Sicca syndrome with keratoconjunctivitis: Secondary | ICD-10-CM

## 2022-04-27 MED ORDER — HYDROXYCHLOROQUINE SULFATE 200 MG PO TABS: 200.0000 mg | ORAL_TABLET | Freq: Every day | ORAL | 0 refills | Status: DC

## 2022-04-27 NOTE — Telephone Encounter (Signed)
Refill request received via fax from [place]. for PLQ   Next Visit: 07/20/2022  Last Visit: 02/14/2022  Labs: 01/17/2022 Alk. Phos 142, ALT 38  Eye exam: 01/04/2021 [facility] Patient has her appointment scheduled for February 2024.   Current Dose per office note 02/14/2022: Plaquenil 200 mg 1 tablet by mouth daily.    DX: Sjogren's syndrome with keratoconjunctivitis sicca   Last Fill: 02/14/2022  Okay to refill Plaquenil?

## 2022-04-27 NOTE — Progress Notes (Signed)
Office Visit Note  Patient: Cynthia Fox             Date of Birth: 08-Dec-1970           MRN: [id number]             PCP: Colon Branch, MD Referring: Colon Branch, MD Visit Date: 04/28/2022 Occupation: @GUAROCC @  Subjective:  Pain in multiple joints   History of Present Illness: Cynthia Fox is a 52 y.o. female with history of sjogren's syndrome. She is taking plaquenil 200 mg 1 tablet by mouth daily.  Patient continues to tolerate Plaquenil without any side effects and has not missed any doses recently.  Patient reports that 2 weeks ago she was experiencing pain in the right thumb.  She states that she used a roll-on anti-inflammatory which alleviated her symptoms.  She states that on Saturday she started to have pain in the right fifth MCP joint.  She states she tried massage as well as the topical anti-inflammatory with minimal improvement.  She took Aleve by mouth which started to alleviate her symptoms.  She states she woke up this morning with right shoulder joint pain.  She states the pain in her right shoulder has been more severe than her right hand.  She requested a right shoulder joint cortisone injection today. She continues to have chronic sicca symptoms which have been unchanged.  She takes pilocarpine 5 mg twice daily.      Activities of Daily Living:  Patient reports morning stiffness for a few minutes.   Patient Reports nocturnal pain.  Difficulty dressing/grooming: Reports Difficulty climbing stairs: Denies Difficulty getting out of chair: Denies Difficulty using hands for taps, buttons, cutlery, and/or writing: Denies  Review of Systems  Constitutional:  Negative for fatigue.  HENT:  Positive for mouth dryness. Negative for mouth sores.   Eyes:  Positive for dryness.  Respiratory:  Negative for shortness of breath.   Cardiovascular:  Negative for chest pain and palpitations.  Gastrointestinal:  Negative for blood in stool, constipation and diarrhea.  Endocrine:  Negative for increased urination.  Genitourinary:  Negative for involuntary urination.  Musculoskeletal:  Positive for joint pain, joint pain, myalgias, muscle weakness, morning stiffness, muscle tenderness and myalgias. Negative for gait problem and joint swelling.  Skin:  Positive for color change. Negative for rash, hair loss and sensitivity to sunlight.  Allergic/Immunologic: Negative for susceptible to infections.  Neurological:  Negative for dizziness and headaches.  Hematological:  Negative for swollen glands.  Psychiatric/Behavioral:  Positive for sleep disturbance. Negative for depressed mood. The patient is not nervous/anxious.     PMFS History:  Patient Active Problem List   Diagnosis Date Noted   H. 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   Annual physical exam 10/24/2016   Menopause 10/13/2016   H/O seasonal allergies 03/23/2016   Vitamin D deficiency 03/23/2016   High risk medication use 03/22/2016   Sjogren's syndrome ([place]) 06/25/2015   PCP NOTES >>>>>>>>>>>>>>>>>>>>>> 06/25/2015   Graves disease 06/01/2015   Hyperprolactinemia ([place]) 01/12/2015   Elevated antinuclear antibody (ANA) level 07/23/2014    Past Medical History:  Diagnosis Date   Compressive optic neuropathy    Graves disease    Hepatitis A    Seasonal allergies    Sjogren's disease ([place])    positive ANA nad Ro   Thyroid disease    Thyroid eye disease     Family History  Problem Relation Age of Onset  Hypertension Mother    Retinal detachment Mother    Thyroid disease Mother    Diabetes Father    Thyroid disease Sister    Other Sister        tonsil    Heart attack Maternal Grandfather        62   Breast cancer Neg Hx    Colon cancer Neg Hx    Stomach cancer Neg Hx    Pancreatic cancer Neg Hx    Esophageal cancer Neg Hx    Past Surgical History:  Procedure Laterality Date   EYE SURGERY Bilateral 2017, 2018   release of optic pressure     FUNCTIONAL ENDOSCOPIC SINUS SURGERY Bilateral 05/2015   Orbital Decompression Bilateral 05/2015   Social History   Social History Narrative   Original from [employer]    Lives w/ husband   Immunization History  Administered Date(s) Administered   Influenza,inj,Quad PF,6+ Mos 12/24/2018   Influenza-Unspecified 12/19/2019, 12/10/2021   PFIZER(Purple Top)SARS-COV-2 Vaccination 07/04/2019, 07/25/2019, 03/10/2020   Tdap 07/30/2012   Zoster Recombinat (Shingrix) 09/01/2021     Objective: Vital Signs: BP 122/85 (BP Location: Left Arm, Patient Position: Sitting, Cuff Size: Normal)   Pulse 93   Resp 16   Ht 5' (1.524 m)   Wt 168 lb 6.4 oz (76.4 kg)   LMP 05/16/2016   BMI 32.89 kg/m    Physical Exam Vitals and nursing note reviewed.  Constitutional:      Appearance: She is well-developed.  HENT:     Head: Normocephalic and atraumatic.  Eyes:     Conjunctiva/sclera: Conjunctivae normal.  Cardiovascular:     Rate and Rhythm: Normal rate and regular rhythm.     Heart sounds: Normal heart sounds.  Pulmonary:     Effort: Pulmonary effort is normal.     Breath sounds: Normal breath sounds.  Abdominal:     General: Bowel sounds are normal.     Palpations: Abdomen is soft.  Musculoskeletal:     Cervical back: Normal range of motion.  Lymphadenopathy:     Cervical: No cervical adenopathy.  Skin:    General: Skin is warm and dry.     Capillary Refill: Capillary refill takes less than 2 seconds.  Neurological:     Mental Status: She is alert and oriented to person, place, and time.  Psychiatric:        Behavior: Behavior normal.      Musculoskeletal Exam: C-spine has good range of motion with no discomfort. Left shoulder has good ROM.  Right shoulder has good ROM with discomfort.  Elbow joints, wrist joints, MCPs, PIPs, DIPs have good range of motion with no synovitis.  Tenderness over the right CMC joint and the right fifth MCP joint.  Hip joints have good range of motion with no groin  pain.  Knee joints have good range of motion with no warmth or effusion.  Ankle joints have good range of motion with no tenderness or synovitis.  CDAI Exam: CDAI Score: -- Patient Global: --; Provider Global: -- Swollen: 0 ; Tender: 3  Joint Exam 04/28/2022      Right  Left  Glenohumeral   Tender     MCP 1   Tender     MCP 5   Tender        Investigation: No additional findings.  Imaging: No results found.  Recent Labs: Lab Results  Component Value Date   WBC 5.0 01/17/2022   HGB 12.9 01/17/2022   PLT 236.0  01/17/2022   NA 141 01/17/2022   K 4.3 01/17/2022   CL 103 01/17/2022   CO2 30 01/17/2022   GLUCOSE 99 01/17/2022   BUN 14 01/17/2022   CREATININE 0.58 01/17/2022   BILITOT 0.7 01/17/2022   ALKPHOS 142 (H) 01/17/2022   AST 37 01/17/2022   ALT 38 (H) 01/17/2022   PROT 7.7 02/14/2022   ALBUMIN 4.3 01/17/2022   CALCIUM 9.2 01/17/2022   GFRAA 117 09/16/2020    Speciality Comments: PLQ Eye Exam: 01/04/2021 WNL@ [facility] Follow up in 1 year (02/07/2022: patient states that she is trying to get an eye exam scheduled with [facility])  Procedures:  Large Joint Inj: R subacromial bursa on 04/28/2022 1:40 PM Indications: pain Details: 27 G 1.5 in needle, posterior approach  Arthrogram: No  Medications: 1 mL lidocaine 1 %; 40 mg triamcinolone acetonide 40 MG/ML Aspirate: 0 mL Outcome: tolerated well, no immediate complications Procedure, treatment alternatives, risks and benefits explained, specific risks discussed. Consent was given by the patient. Immediately prior to procedure a time out was called to verify the correct patient, procedure, equipment, support staff and site/side marked as required. Patient was prepped and draped in the usual sterile fashion.     Allergies: Patient has no known allergies.     Assessment / Plan:     Visit Diagnoses: Sjogren's syndrome with keratoconjunctivitis sicca (HCC) - Positive ANA, positive Ro, positive  rheumatoid factor, negative CCP: Patient was last seen in the office on 02/14/2022.  Patient remains on Plaquenil 200 mg 1 tablet by mouth daily.  She is tolerating Plaquenil without any side effects.  She remains on pilocarpine 5 mg twice daily for sicca symptom relief.   Patient presents today with increased pain involving multiple joints.  Her pain has been most severe in the right shoulder but she has also had intermittent discomfort in the right hand.  She has tenderness over the right [facility] joint secondary to osteoarthritis as well as tenderness over the right fifth MCP joint.  No obvious synovitis was noted today but she has had difficulty performing her ADLs.  A right glenohumeral joint cortisone injection was administered today.  A short low-dose prednisone taper starting at 20 mg tapering by 5 mg every 2 days was sent to the pharmacy.  Instructions were provided.  She will remain on Plaquenil as prescribed.  She was advised to notify us if she develops any new or worsening symptoms.   Lab work from 02/14/22 was reviewed today in the office: complements WNL, UA normal, ESR WNL, IFE did not reveal any monoclonal proteins.  Patient will keep her follow-up visit scheduled on 07/20/2022.  High risk medication use - Plaquenil 200 mg 1 tablet by mouth daily.  PLQ Eye Exam: 01/04/2021 WNL@ [facility] Follow up in 1 year.  Overdue to update Plaquenil eye examination.  Patient was given a Plaquenil eye examination today with her to her upcoming appointment. CBC and CMP updated on 01/17/22.    Raynaud's disease without gangrene: Not currently active.  No digital ulcerations or signs of gangrene were noted.    Elevated LFTs: ALT was 38 and AST was 37 on 01/17/2022.  Osteopenia of multiple sites: DEXA updated on 01/20/22:  The BMD measured at Femur Neck Left is 0.825 g/cm2 with a T-score of -1.5.  She is taking calcium  1200 mg daily and vitamin D 5,000 units twice weekly.    History of vitamin D deficiency:  She is taking  a daily vitamin D supplement.    Chronic right shoulder pain: X-rays of the right shoulder were obtained on 02/14/2022 which were unremarkable.  Patient presents today with recurrence of right shoulder joint pain.  No recent injury.  She has been having discomfort with range of motion and occasional pain at night.  She has been trying to avoid taking Tylenol or anti-inflammatories.  She requested a right shoulder joint cortisone injection today.  She tolerated the procedure well.  Procedure note was completed above.  Aftercare was discussed.  Patient had immediate relief with the injection today. She was advised to notify us if her symptoms persist or worsen.  Other medical conditions are listed as follows:   Primary insomnia  History of Graves' disease  Orders: Orders Placed This Encounter  Procedures   Large Joint Inj: R glenohumeral   Meds ordered this encounter  Medications   predniSONE (DELTASONE) 5 MG tablet    Sig: Take 4 tabs po qd x 2 days, 3  tabs po qd x 2 days, 2  tabs po qd x 2 days, 1  tab po qd x 2 days    Dispense:  20 tablet    Refill:  0    Follow-Up Instructions: Return for Sjogren's syndrome.   Cynthia Neas, PA-C  Note - This record has been created using Dragon software.  Chart creation errors have been sought, but may not always  have been located. Such creation errors do not reflect on  the standard of medical care.

## 2022-04-28 ENCOUNTER — Ambulatory Visit: Payer: 59 | Attending: Physician Assistant | Admitting: Physician Assistant

## 2022-04-28 ENCOUNTER — Encounter: Payer: Self-pay | Admitting: Physician Assistant

## 2022-04-28 VITALS — BP 122/85 | HR 93 | Resp 16 | Ht 60.0 in | Wt 168.4 lb

## 2022-04-28 DIAGNOSIS — Z79899 Other long term (current) drug therapy: Secondary | ICD-10-CM

## 2022-04-28 DIAGNOSIS — G8929 Other chronic pain: Secondary | ICD-10-CM | POA: Diagnosis not present

## 2022-04-28 DIAGNOSIS — R7989 Other specified abnormal findings of blood chemistry: Secondary | ICD-10-CM | POA: Diagnosis not present

## 2022-04-28 DIAGNOSIS — I7300 Raynaud's syndrome without gangrene: Secondary | ICD-10-CM

## 2022-04-28 DIAGNOSIS — M25511 Pain in right shoulder: Secondary | ICD-10-CM

## 2022-04-28 DIAGNOSIS — M3501 Sicca syndrome with keratoconjunctivitis: Secondary | ICD-10-CM | POA: Diagnosis not present

## 2022-04-28 DIAGNOSIS — M8589 Other specified disorders of bone density and structure, multiple sites: Secondary | ICD-10-CM

## 2022-04-28 DIAGNOSIS — Z8639 Personal history of other endocrine, nutritional and metabolic disease: Secondary | ICD-10-CM

## 2022-04-28 DIAGNOSIS — F5101 Primary insomnia: Secondary | ICD-10-CM

## 2022-04-28 MED ORDER — PREDNISONE 5 MG PO TABS: ORAL_TABLET | ORAL | 0 refills | Status: DC

## 2022-04-28 MED ORDER — LIDOCAINE HCL 1 % IJ SOLN
1.0000 mL | INTRAMUSCULAR | Status: AC | PRN
  Administered 2022-04-28: 1 mL

## 2022-04-28 MED ORDER — TRIAMCINOLONE ACETONIDE 40 MG/ML IJ SUSP
40.0000 mg | INTRAMUSCULAR | Status: AC | PRN
  Administered 2022-04-28: 40 mg via INTRA_ARTICULAR

## 2022-05-09 ENCOUNTER — Encounter: Payer: Self-pay | Admitting: Obstetrics & Gynecology

## 2022-05-09 ENCOUNTER — Ambulatory Visit (INDEPENDENT_AMBULATORY_CARE_PROVIDER_SITE_OTHER): Payer: 59 | Admitting: Obstetrics & Gynecology

## 2022-05-09 VITALS — BP 122/82 | HR 84 | Ht <= 58 in | Wt 163.0 lb

## 2022-05-09 DIAGNOSIS — Z780 Asymptomatic menopausal state: Secondary | ICD-10-CM | POA: Diagnosis not present

## 2022-05-09 DIAGNOSIS — Z01419 Encounter for gynecological examination (general) (routine) without abnormal findings: Secondary | ICD-10-CM

## 2022-05-09 DIAGNOSIS — E6609 Other obesity due to excess calories: Secondary | ICD-10-CM

## 2022-05-09 NOTE — Progress Notes (Signed)
Cynthia Fox 08/14/1970 [id number]   History:    52 y.o.  G0 Married   RP:  Established patient presenting for annual gyn exam    HPI: Postmenopause, well on no hormone replacement therapy.  No postmenopausal bleeding.  No pelvic pain.  No pain with IC, but low libido.  Pap 02/2021 Neg. Will repeat at 3 yrs.  Urine and bowel movements normal.  Breasts normal.  Screening mammo 01/2022 Neg. Body mass index 34.07. Low carb diet, decrease portions, intermittent fasting recommended. Not walking regularly.  Health labs with Dr. Larose Fox.  BD Osteopenia Lt femur neck -1.5 in 01/2022.  No colono yet, will organize with her Cynthia Fox.  Had Flu vaccine already this year.   Past medical history,surgical history, family history and social history were all reviewed and documented in the EPIC chart.  Gynecologic History Patient's last menstrual period was 05/16/2016.  Obstetric History OB History  Gravida Para Term Preterm AB Living  0 0 0 0 0 0  SAB IAB Ectopic Multiple Live Births  0 0 0 0       ROS: A ROS was performed and pertinent positives and negatives are included in the history. GENERAL: No fevers or chills. HEENT: No change in vision, no earache, sore throat or sinus congestion. NECK: No pain or stiffness. CARDIOVASCULAR: No chest pain or pressure. No palpitations. PULMONARY: No shortness of breath, cough or wheeze. GASTROINTESTINAL: No abdominal pain, nausea, vomiting or diarrhea, melena or bright red blood per rectum. GENITOURINARY: No urinary frequency, urgency, hesitancy or dysuria. MUSCULOSKELETAL: No joint or muscle pain, no back pain, no recent trauma. DERMATOLOGIC: No rash, no itching, no lesions. ENDOCRINE: No polyuria, polydipsia, no heat or cold intolerance. No recent change in weight. HEMATOLOGICAL: No anemia or easy bruising or bleeding. NEUROLOGIC: No headache, seizures, numbness, tingling or weakness. PSYCHIATRIC: No depression, no loss of interest in normal activity or change in sleep  pattern.     Exam:   BP 122/82 (BP Location: Right Arm, Patient Position: Sitting, Cuff Size: Normal)   Pulse 84   Ht 4\' 10"  (1.473 m)   Wt 163 lb (73.9 kg)   LMP 05/16/2016   SpO2 98%   BMI 34.07 kg/m   Body mass index is 34.07 kg/m.  General appearance : Well developed well nourished female. No acute distress HEENT: Eyes: no retinal hemorrhage or exudates,  Neck supple, trachea midline, no carotid bruits, no thyroidmegaly Lungs: Clear to auscultation, no rhonchi or wheezes, or rib retractions  Heart: Regular rate and rhythm, no murmurs or gallops Breast:Examined in sitting and supine position were symmetrical in appearance, no palpable masses or tenderness,  no skin retraction, no nipple inversion, no nipple discharge, no skin discoloration, no axillary or supraclavicular lymphadenopathy Abdomen: no palpable masses or tenderness, no rebound or guarding Extremities: no edema or skin discoloration or tenderness  Pelvic: Vulva: Normal             Vagina: No gross lesions or discharge  Cervix: No gross lesions or discharge  Uterus  AV, normal size, shape and consistency, non-tender and mobile  Adnexa  Without masses or tenderness  Anus: Normal   Assessment/Plan:  52 y.o. female for annual exam   1. Well female exam with routine gynecological exam Postmenopause, well on no hormone replacement therapy. No postmenopausal bleeding.  No pelvic pain.  No pain with IC, but low libido.  Pap 02/2021 Neg. Will repeat at 3 yrs.  Urine and bowel movements normal.  Breasts  normal.  Screening mammo 01/2022 Neg. Body mass index 34.07. Low carb diet, decrease portions, intermittent fasting recommended. Not walking regularly.  Health labs with Dr. Larose Fox.  BD Osteopenia Lt femur neck -1.5 in 01/2022.  No colono yet, will organize with her Cynthia Fox.  Had Flu vaccine already this year.  2. Postmenopause Postmenopause, well on no hormone replacement therapy. No postmenopausal bleeding.  No pelvic pain.   No pain with IC, but low libido.    3. Class 1 obesity due to excess calories without serious comorbidity with body mass index (BMI) of 34.0 to 34.9 in adult  Body mass index 34.07. Low carb diet, decrease portions, intermittent fasting recommended. Not walking regularly.  Increase fitness activities.  Cynthia Bruins MD, 3:45 PM

## 2022-05-23 ENCOUNTER — Other Ambulatory Visit: Payer: Self-pay | Admitting: Rheumatology

## 2022-05-23 DIAGNOSIS — M3501 Sicca syndrome with keratoconjunctivitis: Secondary | ICD-10-CM

## 2022-05-23 NOTE — Telephone Encounter (Signed)
Next Visit: 07/20/2022  Last Visit: 04/28/2022  Labs: 01/17/2022 Alk. Phos 142, ALT 38   Eye exam: 01/04/2022  I called patient, appt 05/27/2022 w/Dr. Katy Fitch per patient  Current Dose per office note 04/28/2022:  Plaquenil 200 mg 1 tablet by mouth daily.    [id number] syndrome with keratoconjunctivitis sicca   Last Fill:  04/27/2022  Okay to refill Plaquenil?

## 2022-06-22 ENCOUNTER — Other Ambulatory Visit: Payer: Self-pay | Admitting: Physician Assistant

## 2022-06-22 ENCOUNTER — Encounter: Payer: Self-pay | Admitting: *Deleted

## 2022-06-22 DIAGNOSIS — M3501 Sicca syndrome with keratoconjunctivitis: Secondary | ICD-10-CM

## 2022-06-22 NOTE — Telephone Encounter (Signed)
Next Visit: 07/20/2022  Last Visit: 04/28/2022  Labs: 01/17/2022 RBC 3.83, Eosinophils Relative 10.1, Alk. Phos 142, ALT 38  Eye exam: 05/27/2022 WNL   Current Dose per office note 04/28/2022: Plaquenil 200 mg 1 tablet by mouth daily.   [id number] syndrome with keratoconjunctivitis sicca   Last Fill: 05/23/2022 (30 day supply)  Sent message to patient via my chart to advise patient she is due to update labs  Okay to refill Plaquenil?

## 2022-07-06 NOTE — Progress Notes (Deleted)
Office Visit Note  Patient: Cynthia Fox             Date of Birth: March 24, 1971           MRN: [id number]             PCP: Colon Branch, MD Referring: Colon Branch, MD Visit Date: 07/20/2022 Occupation: @GUAROCC @  Subjective:  No chief complaint on file.   History of Present Illness: Cynthia Fox is a 52 y.o. female ***     Activities of Daily Living:  Patient reports morning stiffness for *** {minute/hour:19697}.   Patient {ACTIONS;DENIES/REPORTS:21021675::"Denies"} nocturnal pain.  Difficulty dressing/grooming: {ACTIONS;DENIES/REPORTS:21021675::"Denies"} Difficulty climbing stairs: {ACTIONS;DENIES/REPORTS:21021675::"Denies"} Difficulty getting out of chair: {ACTIONS;DENIES/REPORTS:21021675::"Denies"} Difficulty using hands for taps, buttons, cutlery, and/or writing: {ACTIONS;DENIES/REPORTS:21021675::"Denies"}  No Rheumatology ROS completed.   PMFS History:  Patient Active Problem List   Diagnosis Date Noted   H. 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   Annual physical exam 10/24/2016   Menopause 10/13/2016   H/O seasonal allergies 03/23/2016   Vitamin D deficiency 03/23/2016   High risk medication use 03/22/2016   Sjogren's syndrome ([place]) 06/25/2015   PCP NOTES >>>>>>>>>>>>>>>>>>>>>> 06/25/2015   Graves disease 06/01/2015   Hyperprolactinemia ([place]) 01/12/2015   Elevated antinuclear antibody (ANA) level 07/23/2014    Past Medical History:  Diagnosis Date   Compressive optic neuropathy    Graves disease    Hepatitis A    Seasonal allergies    Sjogren's disease ([place])    positive ANA nad Ro   Thyroid disease    Thyroid eye disease     Family History  Problem Relation Age of Onset   Hypertension Mother    Retinal detachment Mother    Thyroid disease Mother    Diabetes Father    Thyroid disease Sister    Other Sister        tonsil    Heart attack Maternal Grandfather        52   Breast cancer Neg Hx    Colon  cancer Neg Hx    Stomach cancer Neg Hx    Pancreatic cancer Neg Hx    Esophageal cancer Neg Hx    Past Surgical History:  Procedure Laterality Date   EYE SURGERY Bilateral 2017, 2018   release of optic pressure    FUNCTIONAL ENDOSCOPIC SINUS SURGERY Bilateral 05/2015   Orbital Decompression Bilateral 05/2015   Social History   Social History Narrative   Original from [employer]    Lives w/ husband   Immunization History  Administered Date(s) Administered   Influenza,inj,Quad PF,6+ Mos 12/24/2018   Influenza-Unspecified 12/19/2019, 12/10/2021   PFIZER(Purple Top)SARS-COV-2 Vaccination 07/04/2019, 07/25/2019, 03/10/2020   Tdap 07/30/2012   Zoster Recombinat (Shingrix) 09/01/2021     Objective: Vital Signs: LMP 05/16/2016    Physical Exam   Musculoskeletal Exam: ***  CDAI Exam: CDAI Score: -- Patient Global: --; Provider Global: -- Swollen: --; Tender: -- Joint Exam 07/20/2022   No joint exam has been documented for this visit   There is currently no information documented on the homunculus. Go to the Rheumatology activity and complete the homunculus joint exam.  Investigation: No additional findings.  Imaging: No results found.  Recent Labs: Lab Results  Component Value Date   WBC 5.0 01/17/2022   HGB 12.9 01/17/2022   PLT 236.0 01/17/2022   NA 141 01/17/2022   K 4.3 01/17/2022   CL 103 01/17/2022   CO2 30 01/17/2022  GLUCOSE 99 01/17/2022   BUN 14 01/17/2022   CREATININE 0.58 01/17/2022   BILITOT 0.7 01/17/2022   ALKPHOS 142 (H) 01/17/2022   AST 37 01/17/2022   ALT 38 (H) 01/17/2022   PROT 7.7 02/14/2022   ALBUMIN 4.3 01/17/2022   CALCIUM 9.2 01/17/2022   GFRAA 117 09/16/2020    Speciality Comments: PLQ Eye Exam: 05/27/2022 WNL@ [facility] Follow up in 1 year  Procedures:  No procedures performed Allergies: Patient has no known allergies.   Assessment / Plan:     Visit Diagnoses: Sjogren's syndrome with keratoconjunctivitis sicca  (HCC)  High risk medication use  Raynaud's disease without gangrene  Elevated LFTs  Primary insomnia  History of Graves' disease  Osteopenia of multiple sites  History of vitamin D deficiency  Orders: No orders of the defined types were placed in this encounter.  No orders of the defined types were placed in this encounter.   Face-to-face time spent with patient was *** minutes. Greater than 50% of time was spent in counseling and coordination of care.  Follow-Up Instructions: No follow-ups on file.   Ofilia Neas, PA-C  Note - This record has been created using Dragon software.  Chart creation errors have been sought, but may not always  have been located. Such creation errors do not reflect on  the standard of medical care.

## 2022-07-20 ENCOUNTER — Ambulatory Visit: Payer: 59 | Admitting: Rheumatology

## 2022-07-20 DIAGNOSIS — F5101 Primary insomnia: Secondary | ICD-10-CM

## 2022-07-20 DIAGNOSIS — R7989 Other specified abnormal findings of blood chemistry: Secondary | ICD-10-CM

## 2022-07-20 DIAGNOSIS — I7300 Raynaud's syndrome without gangrene: Secondary | ICD-10-CM

## 2022-07-20 DIAGNOSIS — M8589 Other specified disorders of bone density and structure, multiple sites: Secondary | ICD-10-CM

## 2022-07-20 DIAGNOSIS — M3501 Sicca syndrome with keratoconjunctivitis: Secondary | ICD-10-CM

## 2022-07-20 DIAGNOSIS — Z79899 Other long term (current) drug therapy: Secondary | ICD-10-CM

## 2022-07-20 DIAGNOSIS — Z8639 Personal history of other endocrine, nutritional and metabolic disease: Secondary | ICD-10-CM

## 2022-07-22 ENCOUNTER — Other Ambulatory Visit: Payer: Self-pay | Admitting: Physician Assistant

## 2022-07-22 DIAGNOSIS — M3501 Sicca syndrome with keratoconjunctivitis: Secondary | ICD-10-CM

## 2022-07-28 NOTE — Progress Notes (Deleted)
Office Visit Note  Patient: Cynthia Fox             Date of Birth: 1970/12/19           MRN: [id number]             PCP: Wanda Plump, MD Referring: Wanda Plump, MD Visit Date: 08/11/2022 Occupation: @GUAROCC @  Subjective:    History of Present Illness: Cynthia Fox is a 52 y.o. female with history of sjogren's syndrome and osteopenia.  She is taking plaquenil 200 mg 1 tablet by mouth daily.   Lab work from 02/14/22 was reviewed today in the office: IFE did not reveal any monoclonal proteins, complements WNL, ESR WNL, and UA normal.   PLQ Eye Exam: 05/27/2022 WNL@ [facility] Follow up in 1 year.  CBC and CMP orders released today.    Activities of Daily Living:  Patient reports morning stiffness for *** {minute/hour:19697}.   Patient {ACTIONS;DENIES/REPORTS:21021675::"Denies"} nocturnal pain.  Difficulty dressing/grooming: {ACTIONS;DENIES/REPORTS:21021675::"Denies"} Difficulty climbing stairs: {ACTIONS;DENIES/REPORTS:21021675::"Denies"} Difficulty getting out of chair: {ACTIONS;DENIES/REPORTS:21021675::"Denies"} Difficulty using hands for taps, buttons, cutlery, and/or writing: {ACTIONS;DENIES/REPORTS:21021675::"Denies"}  No Rheumatology ROS completed.   PMFS History:  Patient Active Problem List   Diagnosis Date Noted   H. 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   Annual physical exam 10/24/2016   Menopause 10/13/2016   H/O seasonal allergies 03/23/2016   Vitamin D deficiency 03/23/2016   High risk medication use 03/22/2016   Sjogren's syndrome (HCC) 06/25/2015   PCP NOTES >>>>>>>>>>>>>>>>>>>>>> 06/25/2015   Graves disease 06/01/2015   Hyperprolactinemia 01/12/2015   Elevated antinuclear antibody (ANA) level 07/23/2014    Past Medical History:  Diagnosis Date   Compressive optic neuropathy    Graves disease    Hepatitis A    Seasonal allergies    Sjogren's disease (HCC)    positive ANA nad Ro   Thyroid disease     Thyroid eye disease     Family History  Problem Relation Age of Onset   Hypertension Mother    Retinal detachment Mother    Thyroid disease Mother    Diabetes Father    Thyroid disease Sister    Other Sister        tonsil    Heart attack Maternal Grandfather        18   Breast cancer Neg Hx    Colon cancer Neg Hx    Stomach cancer Neg Hx    Pancreatic cancer Neg Hx    Esophageal cancer Neg Hx    Past Surgical History:  Procedure Laterality Date   EYE SURGERY Bilateral 2017, 2018   release of optic pressure    FUNCTIONAL ENDOSCOPIC SINUS SURGERY Bilateral 05/2015   Orbital Decompression Bilateral 05/2015   Social History   Social History Narrative   Original from [employer]    Lives w/ husband   Immunization History  Administered Date(s) Administered   Influenza,inj,Quad PF,6+ Mos 12/24/2018   Influenza-Unspecified 12/19/2019, 12/10/2021   PFIZER(Purple Top)SARS-COV-2 Vaccination 07/04/2019, 07/25/2019, 03/10/2020   Tdap 07/30/2012   Zoster Recombinat (Shingrix) 09/01/2021     Objective: Vital Signs: LMP 05/16/2016    Physical Exam Vitals and nursing note reviewed.  Constitutional:      Appearance: She is well-developed.  HENT:     Head: Normocephalic and atraumatic.  Eyes:     Conjunctiva/sclera: Conjunctivae normal.  Cardiovascular:     Rate and Rhythm: Normal rate and regular rhythm.     Heart  sounds: Normal heart sounds.  Pulmonary:     Effort: Pulmonary effort is normal.     Breath sounds: Normal breath sounds.  Abdominal:     General: Bowel sounds are normal.     Palpations: Abdomen is soft.  Musculoskeletal:     Cervical back: Normal range of motion.  Lymphadenopathy:     Cervical: No cervical adenopathy.  Skin:    General: Skin is warm and dry.     Capillary Refill: Capillary refill takes less than 2 seconds.  Neurological:     Mental Status: She is alert and oriented to person, place, and time.  Psychiatric:        Behavior: Behavior normal.       Musculoskeletal Exam: ***  CDAI Exam: CDAI Score: -- Patient Global: --; Provider Global: -- Swollen: --; Tender: -- Joint Exam 08/11/2022   No joint exam has been documented for this visit   There is currently no information documented on the homunculus. Go to the Rheumatology activity and complete the homunculus joint exam.  Investigation: No additional findings.  Imaging: No results found.  Recent Labs: Lab Results  Component Value Date   WBC 5.0 01/17/2022   HGB 12.9 01/17/2022   PLT 236.0 01/17/2022   NA 141 01/17/2022   K 4.3 01/17/2022   CL 103 01/17/2022   CO2 30 01/17/2022   GLUCOSE 99 01/17/2022   BUN 14 01/17/2022   CREATININE 0.58 01/17/2022   BILITOT 0.7 01/17/2022   ALKPHOS 142 (H) 01/17/2022   AST 37 01/17/2022   ALT 38 (H) 01/17/2022   PROT 7.7 02/14/2022   ALBUMIN 4.3 01/17/2022   CALCIUM 9.2 01/17/2022   GFRAA 117 09/16/2020    Speciality Comments: PLQ Eye Exam: 05/27/2022 WNL@ [facility] Follow up in 1 year  Procedures:  No procedures performed Allergies: Patient has no known allergies.   Assessment / Plan:     Visit Diagnoses: Sjogren's syndrome with keratoconjunctivitis sicca  High risk medication use  Raynaud's disease without gangrene  Elevated LFTs  Primary insomnia  History of vitamin D deficiency  Osteopenia of multiple sites  History of Graves' disease  Orders: No orders of the defined types were placed in this encounter.  No orders of the defined types were placed in this encounter.   Face-to-face time spent with patient was *** minutes. Greater than 50% of time was spent in counseling and coordination of care.  Follow-Up Instructions: No follow-ups on file.   Gearldine Bienenstock, PA-C  Note - This record has been created using Dragon software.  Chart creation errors have been sought, but may not always  have been located. Such creation errors do not reflect on  the standard of medical care.

## 2022-08-02 ENCOUNTER — Other Ambulatory Visit: Payer: Self-pay | Admitting: Rheumatology

## 2022-08-02 DIAGNOSIS — M3501 Sicca syndrome with keratoconjunctivitis: Secondary | ICD-10-CM

## 2022-08-03 ENCOUNTER — Other Ambulatory Visit: Payer: Self-pay | Admitting: *Deleted

## 2022-08-03 DIAGNOSIS — M3501 Sicca syndrome with keratoconjunctivitis: Secondary | ICD-10-CM

## 2022-08-03 MED ORDER — PILOCARPINE HCL 5 MG PO TABS: 5.0000 mg | ORAL_TABLET | Freq: Two times a day (BID) | ORAL | 0 refills | Status: DC | PRN

## 2022-08-03 NOTE — Telephone Encounter (Signed)
Refill request received via fax from [facility] for Pilocarpine   Last Fill: 02/14/2022  Next Visit: 08/11/2022  Last Visit: 04/28/2022  Dx: Sjogren's syndrome with keratoconjunctivitis sicca   Current Dose per office note on 04/28/2022: pilocarpine 5 mg twice daily for sicca symptom relief.   Okay to refill Pilocarpine?

## 2022-08-10 NOTE — Progress Notes (Unsigned)
Office Visit Note  Patient: Cynthia Fox             Date of Birth: [birth year]           MRN: [id number]             PCP: Wanda Plump, MD Referring: Wanda Plump, MD Visit Date: 08/23/2022 Occupation: @GUAROCC @  Subjective:  Medication monitoring   History of Present Illness: Cynthia Fox is a 52 y.o. female with history of sjogren's syndrome.  Patient is currently taking Plaquenil 200 mg 1 tablet by mouth daily.  She is tolerating Plaquenil without any side effects and has not missed any doses recently.  She continues to have chronic sicca symptoms.  She has been taking pilocarpine 5 mg twice daily and using Restasis daily for symptomatic relief.  She denies any swollen lymph nodes.  She denies any sores in her mouth or nose.  Patient states that overall her symptoms have been stable.  She has had some ongoing discomfort in both shoulders especially her right shoulder.  She has had interrupted sleep at night due to nocturnal pain in her right shoulder.  She had a right subacromial bursa cortisone injection on 04/28/2022 which provided temporary relief but her symptoms have returned.  She denies any other joint pain or joint swelling at this time.  Activities of Daily Living:  Patient reports morning stiffness for 0 minutes.   Patient Denies nocturnal pain.  Difficulty dressing/grooming: Denies Difficulty climbing stairs: Denies Difficulty getting out of chair: Denies Difficulty using hands for taps, buttons, cutlery, and/or writing: Reports  Review of Systems  Constitutional:  Negative for fatigue.  HENT:  Positive for mouth dryness. Negative for mouth sores.   Eyes:  Positive for dryness.  Respiratory:  Negative for shortness of breath.   Cardiovascular:  Negative for chest pain and palpitations.  Gastrointestinal:  Negative for blood in stool, constipation and diarrhea.  Endocrine: Negative for increased urination.  Genitourinary:  Negative for involuntary urination.   Musculoskeletal:  Positive for myalgias, muscle weakness, muscle tenderness and myalgias. Negative for joint pain, gait problem, joint pain, joint swelling and morning stiffness.  Skin:  Positive for color change and hair loss. Negative for rash and sensitivity to sunlight.  Allergic/Immunologic: Negative for susceptible to infections.  Neurological:  Positive for numbness. Negative for dizziness and headaches.  Hematological:  Negative for swollen glands.  Psychiatric/Behavioral:  Negative for depressed mood and sleep disturbance. The patient is not nervous/anxious.     PMFS History:  Patient Active Problem List   Diagnosis Date Noted   H. 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   Annual physical exam 10/24/2016   Menopause 10/13/2016   H/O seasonal allergies 03/23/2016   Vitamin D deficiency 03/23/2016   High risk medication use 03/22/2016   Sjogren's syndrome (HCC) 06/25/2015   PCP NOTES >>>>>>>>>>>>>>>>>>>>>> 06/25/2015   Graves disease 06/01/2015   Hyperprolactinemia (HCC) 01/12/2015   Elevated antinuclear antibody (ANA) level 07/23/2014    Past Medical History:  Diagnosis Date   Compressive optic neuropathy    Graves disease    Hepatitis A    Seasonal allergies    Sjogren's disease (HCC)    positive ANA nad Ro   Thyroid disease    Thyroid eye disease     Family History  Problem Relation Age of Onset   Hypertension Mother    Retinal detachment Mother    Thyroid disease Mother    Diabetes  Father    Thyroid disease Sister    Other Sister        tonsil    Heart attack Maternal Grandfather        46   Breast cancer Neg Hx    Colon cancer Neg Hx    Stomach cancer Neg Hx    Pancreatic cancer Neg Hx    Esophageal cancer Neg Hx    Past Surgical History:  Procedure Laterality Date   EYE SURGERY Bilateral 2017, 2018   release of optic pressure    FUNCTIONAL ENDOSCOPIC SINUS SURGERY Bilateral 05/2015   Orbital Decompression  Bilateral 05/2015   Social History   Social History Narrative   Original from [employer]    Lives w/ husband   Immunization History  Administered Date(s) Administered   Influenza,inj,Quad PF,6+ Mos 12/24/2018   Influenza-Unspecified 12/19/2019, 12/10/2021   PFIZER(Purple Top)SARS-COV-2 Vaccination 07/04/2019, 07/25/2019, 03/10/2020   Tdap 07/30/2012   Zoster Recombinat (Shingrix) 09/01/2021     Objective: Vital Signs: BP 122/81 (BP Location: Left Arm, Patient Position: Sitting, Cuff Size: Normal)   Pulse 82   Resp 13   Ht 5' (1.524 m)   Wt 169 lb 12.8 oz (77 kg)   LMP 05/16/2016   BMI 33.16 kg/m    Physical Exam Vitals and nursing note reviewed.  Constitutional:      Appearance: She is well-developed.  HENT:     Head: Normocephalic and atraumatic.  Eyes:     Conjunctiva/sclera: Conjunctivae normal.  Cardiovascular:     Rate and Rhythm: Normal rate and regular rhythm.     Heart sounds: Normal heart sounds.  Pulmonary:     Effort: Pulmonary effort is normal.     Breath sounds: Normal breath sounds.  Abdominal:     General: Bowel sounds are normal.     Palpations: Abdomen is soft.  Musculoskeletal:     Cervical back: Normal range of motion.  Lymphadenopathy:     Cervical: No cervical adenopathy.  Skin:    General: Skin is warm and dry.     Capillary Refill: Capillary refill takes less than 2 seconds.  Neurological:     Mental Status: She is alert and oriented to person, place, and time.  Psychiatric:        Behavior: Behavior normal.      Musculoskeletal Exam: C-spine, thoracic spine, lumbar spine have good range of motion.  Shoulder joints have good range of motion with some discomfort in her right shoulder.  No tenderness upon palpation of the right shoulder.  Elbow joints, wrist joints, MCPs, PIPs, DIPs have good range of motion with no synovitis.  Complete fist formation bilaterally.  Hip joints have good range of motion with no groin pain.  Knee joints have good  range of motion with no warmth or effusion.  Ankle joints have good range of motion with no tenderness or joint swelling.  CDAI Exam: CDAI Score: -- Patient Global: --; Provider Global: -- Swollen: --; Tender: -- Joint Exam 08/23/2022   No joint exam has been documented for this visit   There is currently no information documented on the homunculus. Go to the Rheumatology activity and complete the homunculus joint exam.  Investigation: No additional findings.  Imaging: No results found.  Recent Labs: Lab Results  Component Value Date   WBC 5.0 01/17/2022   HGB 12.9 01/17/2022   PLT 236.0 01/17/2022   NA 141 01/17/2022   K 4.3 01/17/2022   CL 103 01/17/2022   CO2  30 01/17/2022   GLUCOSE 99 01/17/2022   BUN 14 01/17/2022   CREATININE 0.58 01/17/2022   BILITOT 0.7 01/17/2022   ALKPHOS 142 (H) 01/17/2022   AST 37 01/17/2022   ALT 38 (H) 01/17/2022   PROT 7.7 02/14/2022   ALBUMIN 4.3 01/17/2022   CALCIUM 9.2 01/17/2022   GFRAA 117 09/16/2020    Speciality Comments: PLQ Eye Exam: 05/27/2022 WNL@ [facility] Follow up in 1 year  Procedures:  No procedures performed Allergies: Patient has no known allergies.      Assessment / Plan:     Visit Diagnoses: Sjogren's syndrome with keratoconjunctivitis sicca (HCC) - Positive ANA, positive Ro, positive rheumatoid factor, negative CCP: She continues to have chronic sicca symptoms.  She has been taking pilocarpine 5 mg twice daily and using Restasis eyedrops for symptomatic relief.  She remains on Plaquenil 200 mg 1 tablet by mouth daily.  She is tolerating Plaquenil without any side effects.  No signs of inflammatory arthritis noted today. Discussed the increased risk for developing lymphoma and ILD in patients with Sjogren's syndrome.  No cervical lymphadenopathy was noted.  No new or worsening pulmonary symptoms. Lab work on 02/14/22 was reviewed today: IFE did not reveal any monoclonal proteins, complements WNL, and ESR WNL  The following lab work will be updated today. She will remain on plaquenil as prescribed.  A refill of Plaquenil and pilocarpine will be sent to the pharmacy today.  She was advised to notify us if she develops any new or worsening symptoms. - Plan: Protein / creatinine ratio, urine, COMPLETE METABOLIC PANEL WITH GFR, CBC with Differential/Platelet, ANA, C3 and C4, Sedimentation rate, Rheumatoid factor, Serum protein electrophoresis with reflex, Sjogrens syndrome-A extractable nuclear antibody, Sjogrens syndrome-B extractable nuclear antibody  High risk medication use - Plaquenil 200 mg 1 tablet by mouth daily.   PLQ Eye Exam: 05/27/2022 WNL@ [facility] Follow up in 1 year  CBC and CMP released today.  - Plan: COMPLETE METABOLIC PANEL WITH GFR, CBC with Differential/Platelet  Raynaud's disease without gangrene: Not currently active.  No digital ulcerations or signs of sclerodactyly noted.  Elevated LFTs -AST 38 and AST 37 on 01/17/22. CMP updated today.  Plan: COMPLETE METABOLIC PANEL WITH GFR  Osteopenia of multiple sites - DEXA updated on 01/20/22:  The BMD measured at Femur Neck Left is 0.825 g/cm2 with a T-score of -1.5.  She is taking a calcium vitamin D supplement daily.  No recent falls or fractures. Due to update DEXA in October 2025.  History of vitamin D deficiency: She is taking a calcium and vitamin D supplement daily.  Chronic right shoulder pain - X-rays of the right shoulder were obtained on 02/14/2022 which were unremarkable.  She has good range of motion of the right shoulder joint on examination today with some discomfort.  No tenderness upon palpation.  She had a right subacromial bursa cortisone injection on 04/28/2022 which righted temporary relief but her symptoms have returned.  Discussed referral to physical therapy and she was in agreement.  A referral will be placed today for evaluation and treatment of bilateral shoulder pain.  Primary insomnia: She has been experiencing  interrupted sleep at night due to nocturnal pain in her right shoulder.  History of Graves' disease  Orders: Orders Placed This Encounter  Procedures   Protein / creatinine ratio, urine   COMPLETE METABOLIC PANEL WITH GFR   CBC with Differential/Platelet   ANA   C3 and C4   Sedimentation rate  Rheumatoid factor   Serum protein electrophoresis with reflex   Sjogrens syndrome-A extractable nuclear antibody   Sjogrens syndrome-B extractable nuclear antibody   No orders of the defined types were placed in this encounter.    Follow-Up Instructions: Return in about 5 months (around 01/23/2023) for Sjogren's syndrome.   Gearldine Bienenstock, PA-C  Note - This record has been created using Dragon software.  Chart creation errors have been sought, but may not always  have been located. Such creation errors do not reflect on  the standard of medical care.

## 2022-08-11 ENCOUNTER — Ambulatory Visit: Payer: 59 | Admitting: Physician Assistant

## 2022-08-11 DIAGNOSIS — I7300 Raynaud's syndrome without gangrene: Secondary | ICD-10-CM

## 2022-08-11 DIAGNOSIS — Z8639 Personal history of other endocrine, nutritional and metabolic disease: Secondary | ICD-10-CM

## 2022-08-11 DIAGNOSIS — Z79899 Other long term (current) drug therapy: Secondary | ICD-10-CM

## 2022-08-11 DIAGNOSIS — R7989 Other specified abnormal findings of blood chemistry: Secondary | ICD-10-CM

## 2022-08-11 DIAGNOSIS — F5101 Primary insomnia: Secondary | ICD-10-CM

## 2022-08-11 DIAGNOSIS — M8589 Other specified disorders of bone density and structure, multiple sites: Secondary | ICD-10-CM

## 2022-08-11 DIAGNOSIS — M3501 Sicca syndrome with keratoconjunctivitis: Secondary | ICD-10-CM

## 2022-08-23 ENCOUNTER — Encounter: Payer: Self-pay | Admitting: Physician Assistant

## 2022-08-23 ENCOUNTER — Other Ambulatory Visit: Payer: Self-pay

## 2022-08-23 ENCOUNTER — Ambulatory Visit: Payer: 59 | Attending: Rheumatology | Admitting: Physician Assistant

## 2022-08-23 VITALS — BP 122/81 | HR 82 | Resp 13 | Ht 60.0 in | Wt 169.8 lb

## 2022-08-23 DIAGNOSIS — M8589 Other specified disorders of bone density and structure, multiple sites: Secondary | ICD-10-CM

## 2022-08-23 DIAGNOSIS — M3501 Sicca syndrome with keratoconjunctivitis: Secondary | ICD-10-CM | POA: Diagnosis not present

## 2022-08-23 DIAGNOSIS — M25511 Pain in right shoulder: Secondary | ICD-10-CM

## 2022-08-23 DIAGNOSIS — R7989 Other specified abnormal findings of blood chemistry: Secondary | ICD-10-CM | POA: Diagnosis not present

## 2022-08-23 DIAGNOSIS — Z8639 Personal history of other endocrine, nutritional and metabolic disease: Secondary | ICD-10-CM

## 2022-08-23 DIAGNOSIS — I7300 Raynaud's syndrome without gangrene: Secondary | ICD-10-CM | POA: Diagnosis not present

## 2022-08-23 DIAGNOSIS — Z79899 Other long term (current) drug therapy: Secondary | ICD-10-CM

## 2022-08-23 DIAGNOSIS — F5101 Primary insomnia: Secondary | ICD-10-CM

## 2022-08-23 DIAGNOSIS — G8929 Other chronic pain: Secondary | ICD-10-CM

## 2022-08-23 LAB — CBC WITH DIFFERENTIAL/PLATELET
Basophils Relative: 0.8 %
Hemoglobin: 13.3 g/dL (ref 11.7–15.5)
Neutrophils Relative %: 54.4 %

## 2022-08-23 MED ORDER — HYDROXYCHLOROQUINE SULFATE 200 MG PO TABS: ORAL_TABLET | ORAL | 0 refills | Status: DC

## 2022-08-23 MED ORDER — PILOCARPINE HCL 5 MG PO TABS
5.0000 mg | ORAL_TABLET | Freq: Two times a day (BID) | ORAL | 0 refills | Status: DC | PRN
Start: 2022-08-23 — End: 2023-02-16

## 2022-08-23 NOTE — Addendum Note (Signed)
Addended by: Ellen Henri on: 08/23/2022 03:45 PM   Modules accepted: Orders

## 2022-08-23 NOTE — Telephone Encounter (Signed)
Please review and sign pended refills for PLQ and pilocarpine. Thanks!

## 2022-08-24 ENCOUNTER — Other Ambulatory Visit: Payer: Self-pay

## 2022-08-24 DIAGNOSIS — R7989 Other specified abnormal findings of blood chemistry: Secondary | ICD-10-CM

## 2022-08-24 LAB — PROTEIN / CREATININE RATIO, URINE: Protein/Creat Ratio: 56 mg/g creat (ref 24–184)

## 2022-08-24 LAB — COMPLETE METABOLIC PANEL WITH GFR
ALT: 99 U/L — ABNORMAL HIGH (ref 6–29)
CO2: 26 mmol/L (ref 20–32)
Calcium: 9.6 mg/dL (ref 8.6–10.4)
Total Protein: 7.4 g/dL (ref 6.1–8.1)
eGFR: 93 mL/min/{1.73_m2} (ref >=60)

## 2022-08-24 LAB — RHEUMATOID FACTOR: Rheumatoid fact SerPl-aCnc: 13 IU/mL (ref <14)

## 2022-08-24 LAB — C3 AND C4: C3 Complement: 140 mg/dL (ref 83–193)

## 2022-08-24 LAB — PROTEIN ELECTROPHORESIS, SERUM, WITH REFLEX: Total Protein: 7.5 g/dL (ref 6.1–8.1)

## 2022-08-24 LAB — CBC WITH DIFFERENTIAL/PLATELET: Basophils Absolute: 48 cells/uL (ref 0–200)

## 2022-08-24 NOTE — Progress Notes (Signed)
No proteinuria.  Alk phos, AST, and ALT are elevated.  Please clarify if she has been taking any tylenol or alcohol use? Any other medication changes? Please clarify if she has a gastroenterologist?    CBC WNL.  ESR WNL. RF negative.

## 2022-08-24 NOTE — Progress Notes (Signed)
Complements WNL

## 2022-08-24 NOTE — Progress Notes (Signed)
Recheck hepatic function panel in 2 weeks. Avoid tylenol and alcohol use.

## 2022-08-25 ENCOUNTER — Telehealth: Payer: Self-pay

## 2022-08-25 NOTE — Progress Notes (Signed)
Ro antibody remains positive.  La antibody is negative.

## 2022-08-25 NOTE — Telephone Encounter (Signed)
Patient called and requested to speak with nursing staff in regards to labs results. Can you please advise.

## 2022-08-25 NOTE — Telephone Encounter (Signed)
Spoke with patient and advised of lab results from 08/23/2022 again. Patient will return in 2 weeks for hepatic function panel. Provided patient with lab hours. Patient verbalized understanding.

## 2022-08-26 LAB — COMPLETE METABOLIC PANEL WITH GFR
AST: 87 U/L — ABNORMAL HIGH (ref 10–35)
Alkaline phosphatase (APISO): 182 U/L — ABNORMAL HIGH (ref 37–153)
BUN: 14 mg/dL (ref 7–25)
Chloride: 102 mmol/L (ref 98–110)
Creat: 0.76 mg/dL (ref 0.50–1.03)
Glucose, Bld: 96 mg/dL (ref 65–99)
Total Bilirubin: 0.8 mg/dL (ref 0.2–1.2)

## 2022-08-26 LAB — CBC WITH DIFFERENTIAL/PLATELET
Eosinophils Absolute: 462 cells/uL (ref 15–500)
Eosinophils Relative: 7.7 %
HCT: 39.5 % (ref 35.0–45.0)
MCHC: 33.7 g/dL (ref 32.0–36.0)
MCV: 97.5 fL (ref 80.0–100.0)
Monocytes Relative: 8.3 %
RDW: 12.7 % (ref 11.0–15.0)
WBC: 6 10*3/uL (ref 3.8–10.8)

## 2022-08-26 LAB — SJOGRENS SYNDROME-A EXTRACTABLE NUCLEAR ANTIBODY: SSA (Ro) (ENA) Antibody, IgG: 7.1 AI — AB

## 2022-08-26 LAB — ANA: Anti Nuclear Antibody (ANA): POSITIVE — AB

## 2022-08-26 LAB — C3 AND C4: C4 Complement: 26 mg/dL (ref 15–57)

## 2022-08-26 LAB — PROTEIN ELECTROPHORESIS, SERUM, WITH REFLEX: Alpha 1: 0.3 g/dL (ref 0.2–0.3)

## 2022-08-26 LAB — ANTI-NUCLEAR AB-TITER (ANA TITER): ANA Titer 1: >=1:1280 {titer} — AB

## 2022-08-26 NOTE — Progress Notes (Signed)
ANA remains positive-titer unchanged.

## 2022-08-27 LAB — PROTEIN ELECTROPHORESIS, SERUM, WITH REFLEX
Albumin ELP: 4.3 g/dL (ref 3.8–4.8)
Alpha 2: 0.7 g/dL (ref 0.5–0.9)
Beta 2: 0.6 g/dL — ABNORMAL HIGH (ref 0.2–0.5)
Beta Globulin: 0.6 g/dL (ref 0.4–0.6)
Gamma Globulin: 1.1 g/dL (ref 0.8–1.7)

## 2022-08-27 LAB — CBC WITH DIFFERENTIAL/PLATELET
Absolute Monocytes: 498 cells/uL (ref 200–950)
Lymphs Abs: 1728 cells/uL (ref 850–3900)
MCH: 32.8 pg (ref 27.0–33.0)
MPV: 11.5 fL (ref 7.5–12.5)
Neutro Abs: 3264 cells/uL (ref 1500–7800)
Platelets: 287 10*3/uL (ref 140–400)
RBC: 4.05 10*6/uL (ref 3.80–5.10)
Total Lymphocyte: 28.8 %

## 2022-08-27 LAB — SJOGRENS SYNDROME-B EXTRACTABLE NUCLEAR ANTIBODY: SSB (La) (ENA) Antibody, IgG: <1 AI

## 2022-08-27 LAB — PROTEIN / CREATININE RATIO, URINE
Creatinine, Urine: 72 mg/dL (ref 20–275)
Protein/Creatinine Ratio: 0.056 mg/mg creat (ref 0.024–0.184)
Total Protein, Urine: 4 mg/dL — ABNORMAL LOW (ref 5–24)

## 2022-08-27 LAB — COMPLETE METABOLIC PANEL WITH GFR
AG Ratio: 1.5 (calc) (ref 1.0–2.5)
Albumin: 4.4 g/dL (ref 3.6–5.1)
Globulin: 3 g/dL (calc) (ref 1.9–3.7)
Potassium: 4.3 mmol/L (ref 3.5–5.3)
Sodium: 139 mmol/L (ref 135–146)

## 2022-08-27 LAB — SEDIMENTATION RATE: Sed Rate: 25 mm/h (ref 0–30)

## 2022-08-27 LAB — ANTI-NUCLEAR AB-TITER (ANA TITER)

## 2022-08-27 LAB — IFE INTERPRETATION

## 2022-08-29 NOTE — Progress Notes (Signed)
IFE normal-no abnormal protein bands noted

## 2022-09-14 ENCOUNTER — Ambulatory Visit: Payer: 59

## 2022-09-14 ENCOUNTER — Other Ambulatory Visit: Payer: Self-pay | Admitting: *Deleted

## 2022-09-14 DIAGNOSIS — R7989 Other specified abnormal findings of blood chemistry: Secondary | ICD-10-CM

## 2022-09-15 LAB — HEPATIC FUNCTION PANEL
AG Ratio: 1.5 (calc) (ref 1.0–2.5)
ALT: 33 U/L — ABNORMAL HIGH (ref 6–29)
AST: 33 U/L (ref 10–35)
Albumin: 4.4 g/dL (ref 3.6–5.1)
Alkaline phosphatase (APISO): 153 U/L (ref 37–153)
Bilirubin, Direct: 0.2 mg/dL (ref 0.0–0.2)
Globulin: 3 g/dL (calc) (ref 1.9–3.7)
Indirect Bilirubin: 0.5 mg/dL (calc) (ref 0.2–1.2)
Total Bilirubin: 0.7 mg/dL (ref 0.2–1.2)
Total Protein: 7.4 g/dL (ref 6.1–8.1)

## 2022-09-15 NOTE — Progress Notes (Signed)
ALT is borderline elevated-33-trended down/improved from 99.  AST has returned to WNL.  Rest of hepatic function panel WNL.   Continue to avoid tylenol NSAIDs and alcohol use

## 2022-09-15 NOTE — Progress Notes (Signed)
Is she having muscle spasms?

## 2022-11-25 ENCOUNTER — Other Ambulatory Visit: Payer: Self-pay | Admitting: Physician Assistant

## 2022-11-25 DIAGNOSIS — M3501 Sicca syndrome with keratoconjunctivitis: Secondary | ICD-10-CM

## 2022-11-25 NOTE — Telephone Encounter (Signed)
Last Fill: 08/23/2022  Eye exam: 05/27/2022 WNL   Labs: 08/23/2022 CBC WNL Alk phos, AST, and ALT are elevated.   Next Visit: 01/23/2023  Last Visit: 08/23/2022  DX: Sjogren's syndrome with keratoconjunctivitis sicca   Current Dose per office note 08/23/2022: Plaquenil 200 mg 1 tablet by mouth daily.   Okay to refill Plaquenil?

## 2022-12-27 ENCOUNTER — Ambulatory Visit (INDEPENDENT_AMBULATORY_CARE_PROVIDER_SITE_OTHER): Payer: 59 | Admitting: Orthopaedic Surgery

## 2022-12-27 ENCOUNTER — Other Ambulatory Visit (INDEPENDENT_AMBULATORY_CARE_PROVIDER_SITE_OTHER): Payer: 59

## 2022-12-27 DIAGNOSIS — G8929 Other chronic pain: Secondary | ICD-10-CM

## 2022-12-27 DIAGNOSIS — M25512 Pain in left shoulder: Secondary | ICD-10-CM

## 2022-12-27 DIAGNOSIS — M25511 Pain in right shoulder: Secondary | ICD-10-CM

## 2022-12-27 NOTE — Progress Notes (Signed)
Office Visit Note   Patient: Cynthia Fox           Date of Birth: Feb 04, 1971           MRN: [id number] Visit Date: 12/27/2022              Requested by: Wanda Plump, MD [address] [place],  [place] [address] PCP: Wanda Plump, MD   Assessment & Plan: Visit Diagnoses:  1. Chronic right shoulder pain   2. Chronic left shoulder pain     Plan: Cynthia Fox is a 52 year old female with chronic bilateral shoulder pain mainly the right.  She has had 2 subacromial injections within the last few years that have provided temporary relief.  She is reporting lateral shoulder pain concerning for rotator cuff pathology.  At this point we will need an MRI to rule out structural abnormalities.  Follow-up after the MRI.  Follow-Up Instructions: No follow-ups on file.   Orders:  Orders Placed This Encounter  Procedures   XR Shoulder Right   XR Shoulder Left   No orders of the defined types were placed in this encounter.     Procedures: No procedures performed   Clinical Data: No additional findings.   Subjective: Chief Complaint  Patient presents with   Right Shoulder - Pain   Left Shoulder - Pain    HPI Cynthia Fox is a 52 year old female with Sjogren's disease comes in for evaluation of chronic bilateral shoulder pain worse on the right.  She has had pain for 2 years that is worse with lifting and reaching.  Denies any numbness and tingling.  She does do a physical job.  Has pain to the lateral deltoid.  Aleve does not provide any significant relief.  She has had 2 subacromial injections in the last few years with temporary relief.  The first injection helped quite a bit but the second 1 did not work very well. Review of Systems  Constitutional: Negative.   HENT: Negative.    Eyes: Negative.   Respiratory: Negative.    Cardiovascular: Negative.   Endocrine: Negative.   Musculoskeletal: Negative.   Neurological: Negative.   Hematological: Negative.    Psychiatric/Behavioral: Negative.    All other systems reviewed and are negative.    Objective: Vital Signs: LMP 05/16/2016   Physical Exam Vitals and nursing note reviewed.  Constitutional:      Appearance: She is well-developed.  HENT:     Head: Atraumatic.     Nose: Nose normal.  Eyes:     Extraocular Movements: Extraocular movements intact.  Cardiovascular:     Pulses: Normal pulses.  Pulmonary:     Effort: Pulmonary effort is normal.  Abdominal:     Palpations: Abdomen is soft.  Musculoskeletal:     Cervical back: Neck supple.  Skin:    General: Skin is warm.     Capillary Refill: Capillary refill takes less than 2 seconds.  Neurological:     Mental Status: She is alert. Mental status is at baseline.  Psychiatric:        Behavior: Behavior normal.        Thought Content: Thought content normal.        Judgment: Judgment normal.     Ortho Exam Exam of the right shoulder shows preserved range of motion with slight pain to Neer impingement.  Pain and slight weakness with empty can testing.  AC joint nontender.  Negative O'Brien.  Exam of the left  shoulder shows normal range of motion and strength to manual muscle testing of the rotator cuff.  No impingement signs. Specialty Comments:  No specialty comments available.  Imaging: XR Shoulder Left  Result Date: 12/27/2022 X-rays of the left shoulder show no acute or structural abnormalities   XR Shoulder Right  Result Date: 12/27/2022 3 view xrays show no acute or structural abnormalities    PMFS History: Patient Active Problem List   Diagnosis Date Noted   H. 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   Annual physical exam 10/24/2016   Menopause 10/13/2016   H/O seasonal allergies 03/23/2016   Vitamin D deficiency 03/23/2016   High risk medication use 03/22/2016   Sjogren's syndrome (HCC) 06/25/2015   PCP NOTES >>>>>>>>>>>>>>>>>>>>>> 06/25/2015   Graves disease  06/01/2015   Hyperprolactinemia (HCC) 01/12/2015   Elevated antinuclear antibody (ANA) level 07/23/2014   Past Medical History:  Diagnosis Date   Compressive optic neuropathy    Graves disease    Hepatitis A    Seasonal allergies    Sjogren's disease (HCC)    positive ANA nad Ro   Thyroid disease    Thyroid eye disease     Family History  Problem Relation Age of Onset   Hypertension Mother    Retinal detachment Mother    Thyroid disease Mother    Diabetes Father    Thyroid disease Sister    Other Sister        tonsil    Heart attack Maternal Grandfather        48   Breast cancer Neg Hx    Colon cancer Neg Hx    Stomach cancer Neg Hx    Pancreatic cancer Neg Hx    Esophageal cancer Neg Hx     Past Surgical History:  Procedure Laterality Date   EYE SURGERY Bilateral 2017, 2018   release of optic pressure    FUNCTIONAL ENDOSCOPIC SINUS SURGERY Bilateral 05/2015   Orbital Decompression Bilateral 05/2015   Social History   Occupational History   Occupation: shipping office   Tobacco Use   Smoking status: Never    Passive exposure: Past   Smokeless tobacco: Never  Vaping Use   Vaping status: Never Used  Substance and Sexual Activity   Alcohol use: Yes    Comment: weekends occ   Drug use: No   Sexual activity: Yes    Partners: Male    Birth control/protection: Post-menopausal    Comment: 1st intercourse- 21, partners- 4, married- 18 yrs

## 2023-01-09 NOTE — Progress Notes (Deleted)
Office Visit Note  Patient: Cynthia Fox             Date of Birth: 03/17/1971           MRN: [id number]             PCP: Wanda Plump, MD Referring: Wanda Plump, MD Visit Date: 01/23/2023 Occupation: @GUAROCC @  Subjective:  No chief complaint on file.   History of Present Illness: Cynthia Fox is a 52 y.o. female ***     Activities of Daily Living:  Patient reports morning stiffness for *** {minute/hour:19697}.   Patient {ACTIONS;DENIES/REPORTS:21021675::"Denies"} nocturnal pain.  Difficulty dressing/grooming: {ACTIONS;DENIES/REPORTS:21021675::"Denies"} Difficulty climbing stairs: {ACTIONS;DENIES/REPORTS:21021675::"Denies"} Difficulty getting out of chair: {ACTIONS;DENIES/REPORTS:21021675::"Denies"} Difficulty using hands for taps, buttons, cutlery, and/or writing: {ACTIONS;DENIES/REPORTS:21021675::"Denies"}  No Rheumatology ROS completed.   PMFS History:  Patient Active Problem List   Diagnosis Date Noted   H. 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   Annual physical exam 10/24/2016   Menopause 10/13/2016   H/O seasonal allergies 03/23/2016   Vitamin D deficiency 03/23/2016   High risk medication use 03/22/2016   Sjogren's syndrome (HCC) 06/25/2015   PCP NOTES >>>>>>>>>>>>>>>>>>>>>> 06/25/2015   Graves disease 06/01/2015   Hyperprolactinemia (HCC) 01/12/2015   Elevated antinuclear antibody (ANA) level 07/23/2014    Past Medical History:  Diagnosis Date   Compressive optic neuropathy    Graves disease    Hepatitis A    Seasonal allergies    Sjogren's disease (HCC)    positive ANA nad Ro   Thyroid disease    Thyroid eye disease     Family History  Problem Relation Age of Onset   Hypertension Mother    Retinal detachment Mother    Thyroid disease Mother    Diabetes Father    Thyroid disease Sister    Other Sister        tonsil    Heart attack Maternal Grandfather        31   Breast cancer Neg Hx    Colon  cancer Neg Hx    Stomach cancer Neg Hx    Pancreatic cancer Neg Hx    Esophageal cancer Neg Hx    Past Surgical History:  Procedure Laterality Date   EYE SURGERY Bilateral 2017, 2018   release of optic pressure    FUNCTIONAL ENDOSCOPIC SINUS SURGERY Bilateral 05/2015   Orbital Decompression Bilateral 05/2015   Social History   Social History Narrative   Original from [employer]    Lives w/ husband   Immunization History  Administered Date(s) Administered   Influenza,inj,Quad PF,6+ Mos 12/24/2018   Influenza-Unspecified 12/19/2019, 12/10/2021   PFIZER(Purple Top)SARS-COV-2 Vaccination 07/04/2019, 07/25/2019, 03/10/2020   Tdap 07/30/2012   Zoster Recombinant(Shingrix) 09/01/2021     Objective: Vital Signs: LMP 05/16/2016    Physical Exam   Musculoskeletal Exam: ***  CDAI Exam: CDAI Score: -- Patient Global: --; Provider Global: -- Swollen: --; Tender: -- Joint Exam 01/23/2023   No joint exam has been documented for this visit   There is currently no information documented on the homunculus. Go to the Rheumatology activity and complete the homunculus joint exam.  Investigation: No additional findings.  Imaging: XR Shoulder Left  Result Date: 12/27/2022 X-rays of the left shoulder show no acute or structural abnormalities   XR Shoulder Right  Result Date: 12/27/2022 3 view xrays show no acute or structural abnormalities   Recent Labs: Lab Results  Component Value Date   WBC 6.0  08/23/2022   HGB 13.3 08/23/2022   PLT 287 08/23/2022   NA 139 08/23/2022   K 4.3 08/23/2022   CL 102 08/23/2022   CO2 26 08/23/2022   GLUCOSE 96 08/23/2022   BUN 14 08/23/2022   CREATININE 0.76 08/23/2022   BILITOT 0.7 09/14/2022   ALKPHOS 142 (H) 01/17/2022   AST 33 09/14/2022   ALT 33 (H) 09/14/2022   PROT 7.4 09/14/2022   ALBUMIN 4.3 01/17/2022   CALCIUM 9.6 08/23/2022   GFRAA 117 09/16/2020    Speciality Comments: PLQ Eye Exam: 05/27/2022 WNL@ [facility] Follow up in 1  year  Procedures:  No procedures performed Allergies: Patient has no known allergies.   Assessment / Plan:     Visit Diagnoses: Sjogren's syndrome with keratoconjunctivitis sicca (HCC)  High risk medication use  Raynaud's disease without gangrene  Osteopenia of multiple sites  History of vitamin D deficiency  Elevated LFTs  Chronic right shoulder pain  Primary insomnia  History of Graves' disease  Orders: No orders of the defined types were placed in this encounter.  No orders of the defined types were placed in this encounter.   Face-to-face time spent with patient was *** minutes. Greater than 50% of time was spent in counseling and coordination of care.  Follow-Up Instructions: No follow-ups on file.   Gearldine Bienenstock, PA-C  Note - This record has been created using Dragon software.  Chart creation errors have been sought, but may not always  have been located. Such creation errors do not reflect on  the standard of medical care.

## 2023-01-10 LAB — HM MAMMOGRAPHY

## 2023-01-11 ENCOUNTER — Encounter: Payer: Self-pay | Admitting: Internal Medicine

## 2023-01-14 ENCOUNTER — Ambulatory Visit
Admission: RE | Admit: 2023-01-14 | Discharge: 2023-01-14 | Disposition: A | Discharge status: Home / Self Care | Payer: Managed Care, Other (non HMO) | Source: Ambulatory Visit | Attending: Orthopaedic Surgery | Admitting: Orthopaedic Surgery

## 2023-01-14 DIAGNOSIS — G8929 Other chronic pain: Secondary | ICD-10-CM

## 2023-01-20 ENCOUNTER — Ambulatory Visit: Payer: Managed Care, Other (non HMO) | Admitting: Orthopaedic Surgery

## 2023-01-20 ENCOUNTER — Encounter: Payer: Self-pay | Admitting: Medical

## 2023-01-20 ENCOUNTER — Ambulatory Visit (INDEPENDENT_AMBULATORY_CARE_PROVIDER_SITE_OTHER): Payer: Managed Care, Other (non HMO) | Admitting: Medical

## 2023-01-20 VITALS — BP 120/80 | HR 88 | Temp 98.0°F | Resp 16 | Ht 61.0 in | Wt 166.0 lb

## 2023-01-20 DIAGNOSIS — Z8639 Personal history of other endocrine, nutritional and metabolic disease: Secondary | ICD-10-CM

## 2023-01-20 DIAGNOSIS — Z1211 Encounter for screening for malignant neoplasm of colon: Secondary | ICD-10-CM | POA: Diagnosis not present

## 2023-01-20 DIAGNOSIS — Z0000 Encounter for general adult medical examination without abnormal findings: Secondary | ICD-10-CM

## 2023-01-20 DIAGNOSIS — Z23 Encounter for immunization: Secondary | ICD-10-CM | POA: Diagnosis not present

## 2023-01-20 DIAGNOSIS — D1721 Benign lipomatous neoplasm of skin and subcutaneous tissue of right arm: Secondary | ICD-10-CM

## 2023-01-20 NOTE — Progress Notes (Signed)
Subjective:    Patient ID: Cynthia Fox, female    DOB: 01-18-1971, 52 y.o.   MRN: [id number]  HPI  Pt in for wellness exam.  [employer]. Started working out 3-5 times a week. She is trying to lose wt. Lost 6 pounds. Non smoker. Very rare alcohol use.   Pt got flu vaccine in September.   Pt states got 2 vaccines again shingrix. 2nd one done at [facility].  Tdap due. Pt will get today.  Up to date on mammogram and pap.  Placing cologuard.  Hx of graves- endocrine released to primary care to follow. Will repeat tsh and t4 today  Also 5 years ago rt axillary lump that was extensively worked up and determined lipoma. Pt at that time did not want to remove. Now she does want referral.  Review of Systems  Constitutional:  Negative for chills, fatigue and fever.  Respiratory:  Negative for cough, chest tightness, shortness of breath and wheezing.   Cardiovascular:  Negative for chest pain and palpitations.  Gastrointestinal:  Negative for abdominal pain.  Genitourinary:  Negative for dysuria and frequency.  Musculoskeletal:  Negative for arthralgias.  Neurological:  Negative for dizziness and light-headedness.  Psychiatric/Behavioral:  Negative for behavioral problems and decreased concentration.     Past Medical History:  Diagnosis Date   Compressive optic neuropathy    Graves disease    Hepatitis A    Seasonal allergies    Sjogren's disease (HCC)    positive ANA nad Ro   Thyroid disease    Thyroid eye disease      Social History   Socioeconomic History   Marital status: Married    Spouse name: Not on file   Number of children: 0   Years of education: Not on file   Highest education level: Not on file  Occupational History   Occupation: shipping office   Tobacco Use   Smoking status: Never    Passive exposure: Past   Smokeless tobacco: Never  Vaping Use   Vaping status: Never Used  Substance and Sexual Activity   Alcohol use: Yes    Comment: weekends  occ   Drug use: No   Sexual activity: Yes    Partners: Male    Birth control/protection: Post-menopausal    Comment: 1st intercourse- 21, partners- 4, married- 18 yrs   Other Topics Concern   Not on file  Social History Narrative   Original from [employer]    Lives w/ husband   Social Determinants of Health   Financial Resource Strain: Not on file  Food Insecurity: Not on file  Transportation Needs: Not on file  Physical Activity: Not on file  Stress: Not on file  Social Connections: Unknown (08/20/2021)   Received [facility]   Social Network    Social Network: Not on file  Intimate Partner Violence: Unknown (07/13/2021)   Received from [facility]   HITS    Physically Hurt: Not on file    Insult or Talk Down To: Not on file    Threaten Physical Harm: Not on file    Scream or Curse: Not on file    Past Surgical History:  Procedure Laterality Date   EYE SURGERY Bilateral 2017, 2018   release of optic pressure    FUNCTIONAL ENDOSCOPIC SINUS SURGERY Bilateral 05/2015   Orbital Decompression Bilateral 05/2015    Family History  Problem Relation Age of Onset   Hypertension Mother  Retinal detachment Mother    Thyroid disease Mother    Diabetes Father    Thyroid disease Sister    Other Sister        tonsil    Heart attack Maternal Grandfather        81   Breast cancer Neg Hx    Colon cancer Neg Hx    Stomach cancer Neg Hx    Pancreatic cancer Neg Hx    Esophageal cancer Neg Hx     No Known Allergies  Current Outpatient Medications on File Prior to Visit  Medication Sig Dispense Refill   amLODipine (NORVASC) 2.5 MG tablet Take 1 tablet (2.5 mg total) by mouth daily. 30 tablet 2   calcium carbonate (OS-CAL - DOSED IN MG OF ELEMENTAL CALCIUM) 1250 (500 Ca) MG tablet Take 1 tablet by mouth.     cetirizine (ZYRTEC) 10 MG tablet Take 10 mg by mouth daily.     hydroxychloroquine (PLAQUENIL) 200 MG tablet TAKE 1 TABLET(200 MG) BY MOUTH  DAILY 90 tablet 0   Multiple Vitamins-Minerals (ONE-A-DAY WOMENS PO) Take by mouth daily.     pilocarpine (SALAGEN) 5 MG tablet Take 1 tablet (5 mg total) by mouth 2 (two) times daily as needed. 180 tablet 0   predniSONE (DELTASONE) 5 MG tablet Take 4 tabs po qd x 2 days, 3  tabs po qd x 2 days, 2  tabs po qd x 2 days, 1  tab po qd x 2 days 20 tablet 0   RESTASIS 0.05 % ophthalmic emulsion 1 drop 2 (two) times daily.     VITAMIN D PO Take by mouth 2 (two) times a week.     No current facility-administered medications on file prior to visit.    BP 120/80   Pulse 88   Temp 98 F (36.7 C) (Oral)   Resp 16   Ht 5\' 1"  (1.549 m)   Wt 166 lb (75.3 kg)   LMP 05/16/2016   SpO2 97%   BMI 31.37 kg/m        Objective:   Physical Exam  General Mental Status- Alert. General Appearance- Not in acute distress.   Skin General: Color- Normal Color. Moisture- Normal Moisture.  Neck Carotid Arteries- Normal color. Moisture- Normal Moisture. No carotid bruits. No JVD.  Chest and Lung Exam Auscultation: Breath Sounds:-Normal.  Cardiovascular Auscultation:Rythm- Regular. Murmurs & Other Heart Sounds:Auscultation of the heart reveals- No Murmurs.  Abdomen Inspection:-Inspeection Normal. Palpation/Percussion:Note:No mass. Palpation and Percussion of the abdomen reveal- Non Tender, Non Distended + BS, no rebound or guarding.   Neurologic Cranial Nerve exam:- CN III-XII intact(No nystagmus), symmetric smile. ic strength both upper and lower extremities.   Rt axillary area- 2.5-3.0 cm lipoma     Assessment & Plan:   Patient Instructions  For you wellness exam today I have ordered cbc, cmp and lipid panel.(Future fasting)  Also for hx of graves disease place order for tsh, t3 and t4  Vaccine given today. Tdap.  Cologuard order place  Recommend exercise and healthy diet.  We will let you know lab results as they come in.  Hold wellness form and fill out after lab  results.  Follow up date appointment will be determined after lab review.     Esperanza Richters, PA-C   refer to surgeon to discuss rt axillary lipoma surgery option.  [id number] charge as did address and discuss with pt plan for her rt axillary lipoma. Placed referral to general surgeon.

## 2023-01-20 NOTE — Addendum Note (Signed)
Addended byConrad [place] D on: 01/20/2023 12:05 PM   Modules accepted: Orders

## 2023-01-20 NOTE — Patient Instructions (Addendum)
For you wellness exam today I have ordered cbc, cmp and lipid panel.(Future fasting)  Also for hx of graves disease place order for tsh, t3 and t4  Vaccine given today. Tdap.  Cologuard order place  Recommend exercise and healthy diet.  We will let you know lab results as they come in.  Hold wellness form and fill out after lab results.  Follow up date appointment will be determined after lab review.    At pt request refer to surgeon to discuss rt axillary lipoma surgery option.    Preventive Care 49-24 Years Old, Female Preventive care refers to lifestyle choices and visits with your health care provider that can promote health and wellness. Preventive care visits are also called wellness exams. What can I expect for my preventive care visit? Counseling Your health care provider may ask you questions about your: Medical history, including: Past medical problems. Family medical history. Pregnancy history. Current health, including: Menstrual cycle. Method of birth control. Emotional well-being. Home life and relationship well-being. Sexual activity and sexual health. Lifestyle, including: Alcohol, nicotine or tobacco, and drug use. Access to firearms. Diet, exercise, and sleep habits. Work and work [employer]. Sunscreen use. Safety issues such as seatbelt and bike helmet use. Physical exam Your health care provider will check your: Height and weight. These may be used to calculate your BMI (body mass index). BMI is a measurement that tells if you are at a healthy weight. Waist circumference. This measures the distance around your waistline. This measurement also tells if you are at a healthy weight and may help predict your risk of certain diseases, such as type 2 diabetes and high blood pressure. Heart rate and blood pressure. Body temperature. Skin for abnormal spots. What immunizations do I need?  Vaccines are usually given at various ages, according to a  schedule. Your health care provider will recommend vaccines for you based on your age, medical history, and lifestyle or other factors, such as travel or where you work. What tests do I need? Screening Your health care provider may recommend screening tests for certain conditions. This may include: Lipid and cholesterol levels. Diabetes screening. This is done by checking your blood sugar (glucose) after you have not eaten for a while (fasting). Pelvic exam and Pap test. Hepatitis B test. Hepatitis C test. HIV (human immunodeficiency virus) test. STI (sexually transmitted infection) testing, if you are at risk. Lung cancer screening. Colorectal cancer screening. Mammogram. Talk with your health care provider about when you should start having regular mammograms. This may depend on whether you have a family history of breast cancer. BRCA-related cancer screening. This may be done if you have a family history of breast, ovarian, tubal, or peritoneal cancers. Bone density scan. This is done to screen for osteoporosis. Talk with your health care provider about your test results, treatment options, and if necessary, the need for more tests. Follow these instructions at home: Eating and drinking  Eat a diet that includes fresh fruits and vegetables, whole grains, lean protein, and low-fat dairy products. Take vitamin and mineral supplements as recommended by your health care provider. Do not drink alcohol if: Your health care provider tells you not to drink. You are pregnant, may be pregnant, or are planning to become pregnant. If you drink alcohol: Limit how much you have to 0-1 drink a day. Know how much alcohol is in your drink. In the [place] one drink equals one 12 oz bottle of beer (355 mL), one 5 oz glass  of wine (148 mL), or one 1 oz glass of hard liquor (44 mL). Lifestyle Brush your teeth every morning and night with fluoride toothpaste. Floss one time each day. Exercise for at least  30 minutes 5 or more days each week. Do not use any products that contain nicotine or tobacco. These products include cigarettes, chewing tobacco, and vaping devices, such as e-cigarettes. If you need help quitting, ask your health care provider. Do not use drugs. If you are sexually active, practice safe sex. Use a condom or other form of protection to prevent STIs. If you do not wish to become pregnant, use a form of birth control. If you plan to become pregnant, see your health care provider for a prepregnancy visit. Take aspirin only as told by your health care provider. Make sure that you understand how much to take and what form to take. Work with your health care provider to find out whether it is safe and beneficial for you to take aspirin daily. Find healthy ways to manage stress, such as: Meditation, yoga, or listening to music. Journaling. Talking to a trusted person. Spending time with friends and family. Minimize exposure to UV radiation to reduce your risk of skin cancer. Safety Always wear your seat belt while driving or riding in a vehicle. Do not drive: If you have been drinking alcohol. Do not ride with someone who has been drinking. When you are tired or distracted. While texting. If you have been using any mind-altering substances or drugs. Wear a helmet and other protective equipment during sports activities. If you have firearms in your house, make sure you follow all gun safety procedures. Seek help if you have been physically or sexually abused. What's next? Visit your health care provider once a year for an annual wellness visit. Ask your health care provider how often you should have your eyes and teeth checked. Stay up to date on all vaccines. This information is not intended to replace advice given to you by your health care provider. Make sure you discuss any questions you have with your health care provider. Document Revised: 09/23/2020 Document Reviewed:  09/23/2020 Elsevier Patient Education  2024 [employer].

## 2023-01-23 ENCOUNTER — Ambulatory Visit: Payer: 59 | Admitting: Physician Assistant

## 2023-01-23 DIAGNOSIS — Z8639 Personal history of other endocrine, nutritional and metabolic disease: Secondary | ICD-10-CM

## 2023-01-23 DIAGNOSIS — I7300 Raynaud's syndrome without gangrene: Secondary | ICD-10-CM

## 2023-01-23 DIAGNOSIS — G8929 Other chronic pain: Secondary | ICD-10-CM

## 2023-01-23 DIAGNOSIS — M3501 Sicca syndrome with keratoconjunctivitis: Secondary | ICD-10-CM

## 2023-01-23 DIAGNOSIS — F5101 Primary insomnia: Secondary | ICD-10-CM

## 2023-01-23 DIAGNOSIS — R7989 Other specified abnormal findings of blood chemistry: Secondary | ICD-10-CM

## 2023-01-23 DIAGNOSIS — M8589 Other specified disorders of bone density and structure, multiple sites: Secondary | ICD-10-CM

## 2023-01-23 DIAGNOSIS — Z79899 Other long term (current) drug therapy: Secondary | ICD-10-CM

## 2023-01-24 ENCOUNTER — Encounter: Payer: 59 | Admitting: Internal Medicine

## 2023-01-24 ENCOUNTER — Ambulatory Visit: Payer: Managed Care, Other (non HMO) | Admitting: Orthopaedic Surgery

## 2023-01-24 ENCOUNTER — Other Ambulatory Visit (INDEPENDENT_AMBULATORY_CARE_PROVIDER_SITE_OTHER): Payer: Managed Care, Other (non HMO)

## 2023-01-24 DIAGNOSIS — G8929 Other chronic pain: Secondary | ICD-10-CM | POA: Diagnosis not present

## 2023-01-24 DIAGNOSIS — M25511 Pain in right shoulder: Secondary | ICD-10-CM | POA: Diagnosis not present

## 2023-01-24 DIAGNOSIS — Z1211 Encounter for screening for malignant neoplasm of colon: Secondary | ICD-10-CM | POA: Diagnosis not present

## 2023-01-24 DIAGNOSIS — Z0000 Encounter for general adult medical examination without abnormal findings: Secondary | ICD-10-CM | POA: Diagnosis not present

## 2023-01-24 LAB — CBC WITH DIFFERENTIAL/PLATELET
Basophils Absolute: 0.1 10*3/uL (ref 0.0–0.1)
Basophils Relative: 1.1 % (ref 0.0–3.0)
Eosinophils Absolute: 0.4 10*3/uL (ref 0.0–0.7)
Eosinophils Relative: 7.9 % — ABNORMAL HIGH (ref 0.0–5.0)
HCT: 40.8 % (ref 36.0–46.0)
Hemoglobin: 13.4 g/dL (ref 12.0–15.0)
Lymphocytes Relative: 32 % (ref 12.0–46.0)
Lymphs Abs: 1.6 10*3/uL (ref 0.7–4.0)
MCHC: 32.7 g/dL (ref 30.0–36.0)
MCV: 100.6 fL — ABNORMAL HIGH (ref 78.0–100.0)
Monocytes Absolute: 0.4 10*3/uL (ref 0.1–1.0)
Monocytes Relative: 8.9 % (ref 3.0–12.0)
Neutro Abs: 2.5 10*3/uL (ref 1.4–7.7)
Neutrophils Relative %: 50.1 % (ref 43.0–77.0)
Platelets: 248 10*3/uL (ref 150.0–400.0)
RBC: 4.06 Mil/uL (ref 3.87–5.11)
RDW: 13.8 % (ref 11.5–15.5)
WBC: 5 10*3/uL (ref 4.0–10.5)

## 2023-01-24 LAB — LIPID PANEL
Cholesterol: 160 mg/dL (ref 0–200)
HDL: 48 mg/dL (ref >39.00)
LDL Cholesterol: 90 mg/dL (ref 0–99)
NonHDL: 112.27
Total CHOL/HDL Ratio: 3
Triglycerides: 113 mg/dL (ref 0.0–149.0)
VLDL: 22.6 mg/dL (ref 0.0–40.0)

## 2023-01-24 LAB — COMPREHENSIVE METABOLIC PANEL
ALT: 33 U/L (ref 0–35)
AST: 30 U/L (ref 0–37)
Albumin: 4.5 g/dL (ref 3.5–5.2)
Alkaline Phosphatase: 139 U/L — ABNORMAL HIGH (ref 39–117)
BUN: 10 mg/dL (ref 6–23)
CO2: 28 meq/L (ref 19–32)
Calcium: 10 mg/dL (ref 8.4–10.5)
Chloride: 103 meq/L (ref 96–112)
Creatinine, Ser: 0.63 mg/dL (ref 0.40–1.20)
GFR: 100.32 mL/min (ref >60.00)
Glucose, Bld: 102 mg/dL — ABNORMAL HIGH (ref 70–99)
Potassium: 4.1 meq/L (ref 3.5–5.1)
Sodium: 140 meq/L (ref 135–145)
Total Bilirubin: 0.7 mg/dL (ref 0.2–1.2)
Total Protein: 7.7 g/dL (ref 6.0–8.3)

## 2023-01-24 LAB — T3, FREE: T3, Free: 3.6 pg/mL (ref 2.3–4.2)

## 2023-01-24 LAB — T4, FREE: Free T4: 0.78 ng/dL (ref 0.60–1.60)

## 2023-01-24 LAB — TSH: TSH: 2.02 u[IU]/mL (ref 0.35–5.50)

## 2023-01-24 MED ORDER — NAPROXEN 500 MG PO TABS: 500.0000 mg | ORAL_TABLET | Freq: Two times a day (BID) | ORAL | 3 refills | Status: DC

## 2023-01-24 NOTE — Progress Notes (Signed)
Office Visit Note   Patient: Cynthia Fox           Date of Birth: 10-19-1970           MRN: [id number] Visit Date: 01/24/2023              Requested by: Wanda Plump, MD [address] [place],  [place] [address] PCP: Wanda Plump, MD   Assessment & Plan: Visit Diagnoses:  1. 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  MRI findings were reviewed with the patient and treatment options were discussed between continued symptomatic management versus surgical debridement.  She would like to try some home exercises and NSAIDs which I have prescribed.  She will follow-up if symptoms do not improve.  Follow-Up Instructions: No follow-ups on file.   Orders:  No orders of the defined types were placed in this encounter.  Meds ordered this encounter  Medications   naproxen (NAPROSYN) 500 MG tablet    Sig: Take 1 tablet (500 mg total) by mouth 2 (two) times daily with a meal.    Dispense:  30 tablet    Refill:  3      Procedures: No procedures performed   Clinical Data: No additional findings.   Subjective: Chief Complaint  Patient presents with   Right Shoulder - Follow-up    HPI Lateya returns today for right shoulder MRI review. Review of Systems   Objective: Vital Signs: LMP 05/16/2016   Physical Exam  Ortho Exam Exam of the right shoulder is unchanged. Specialty Comments:  No specialty comments available.  Imaging: No results found.   PMFS History: Patient Active Problem List   Diagnosis Date Noted   H. 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   Annual physical exam 10/24/2016   Menopause 10/13/2016   H/O seasonal allergies 03/23/2016   Vitamin D deficiency 03/23/2016   High risk medication use 03/22/2016   Sjogren's syndrome (HCC) 06/25/2015   PCP NOTES >>>>>>>>>>>>>>>>>>>>>> 06/25/2015    Graves disease 06/01/2015   Hyperprolactinemia (HCC) 01/12/2015   Elevated antinuclear antibody (ANA) level 07/23/2014   Past Medical History:  Diagnosis Date   Compressive optic neuropathy    Graves disease    Hepatitis A    Seasonal allergies    Sjogren's disease (HCC)    positive ANA nad Ro   Thyroid disease    Thyroid eye disease     Family History  Problem Relation Age of Onset   Hypertension Mother    Retinal detachment Mother    Thyroid disease Mother    Diabetes Father    Thyroid disease Sister    Other Sister        tonsil    Heart attack Maternal Grandfather        19   Breast cancer Neg Hx    Colon cancer Neg Hx    Stomach cancer Neg Hx    Pancreatic cancer Neg Hx    Esophageal cancer Neg Hx     Past Surgical History:  Procedure Laterality Date   EYE SURGERY Bilateral 2017, 2018   release of optic pressure    FUNCTIONAL ENDOSCOPIC SINUS SURGERY Bilateral 05/2015   Orbital Decompression Bilateral 05/2015   Social History   Occupational History   Occupation: shipping office   Tobacco Use   Smoking status: Never  Passive exposure: Past   Smokeless tobacco: Never  Vaping Use   Vaping status: Never Used  Substance and Sexual Activity   Alcohol use: Yes    Comment: weekends occ   Drug use: No   Sexual activity: Yes    Partners: Male    Birth control/protection: Post-menopausal    Comment: 1st intercourse- 21, partners- 4, married- 18 yrs

## 2023-01-25 ENCOUNTER — Telehealth: Payer: Self-pay | Admitting: Medical

## 2023-01-25 NOTE — Telephone Encounter (Signed)
Filled out pt annual wellness exam form. Placing on your keyboard today. Please fax to her employer or have pt pick up form.

## 2023-01-25 NOTE — Telephone Encounter (Signed)
Spoke with pt, pt is aware form has been completed. Pt states she will come by the office tomorrow to pick form up. Form has been placed up front for pt pick up.

## 2023-01-31 ENCOUNTER — Ambulatory Visit: Payer: Self-pay | Admitting: General Surgery

## 2023-01-31 MED ORDER — KETOROLAC TROMETHAMINE 15 MG/ML IJ SOLN: 15.0000 mg | INTRAMUSCULAR | Status: AC

## 2023-02-03 LAB — COLOGUARD: COLOGUARD: NEGATIVE

## 2023-02-16 ENCOUNTER — Encounter (HOSPITAL_BASED_OUTPATIENT_CLINIC_OR_DEPARTMENT_OTHER): Payer: Self-pay | Admitting: General Surgery

## 2023-02-21 NOTE — Progress Notes (Deleted)
Office Visit Note  Patient: Cynthia Fox             Date of Birth: 03/27/1971           MRN: [id number]             PCP: Wanda Plump, MD Referring: Wanda Plump, MD Visit Date: 03/07/2023 Occupation: @GUAROCC @  Subjective:  No chief complaint on file.   History of Present Illness: Cynthia Fox is a 52 y.o. female ***     Activities of Daily Living:  Patient reports morning stiffness for *** {minute/hour:19697}.   Patient {ACTIONS;DENIES/REPORTS:21021675::"Denies"} nocturnal pain.  Difficulty dressing/grooming: {ACTIONS;DENIES/REPORTS:21021675::"Denies"} Difficulty climbing stairs: {ACTIONS;DENIES/REPORTS:21021675::"Denies"} Difficulty getting out of chair: {ACTIONS;DENIES/REPORTS:21021675::"Denies"} Difficulty using hands for taps, buttons, cutlery, and/or writing: {ACTIONS;DENIES/REPORTS:21021675::"Denies"}  No Rheumatology ROS completed.   PMFS History:  Patient Active Problem List   Diagnosis Date Noted  . H. pylori infection, treated 06/2018 09/08/2018  . 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 Annual physical exam 10/24/2016  . Menopause 10/13/2016  . H/O seasonal allergies 03/23/2016  . Vitamin D deficiency 03/23/2016  . High risk medication use 03/22/2016  . Sjogren's syndrome (HCC) 06/25/2015  . PCP NOTES >>>>>>>>>>>>>>>>>>>>>> 06/25/2015  . Graves disease 06/01/2015  . Hyperprolactinemia (HCC) 01/12/2015  . Elevated antinuclear antibody (ANA) level 07/23/2014    Past Medical History:  Diagnosis Date  . Compressive optic neuropathy   . Graves disease   . Hepatitis A   . Seasonal allergies   . Sjogren's disease (HCC)    positive ANA nad Ro  . Thyroid disease   . Thyroid eye disease     Family History  Problem Relation Age of Onset  . Hypertension Mother   . Retinal detachment Mother   . Thyroid disease Mother   . Diabetes Father   . Thyroid disease Sister   . Other Sister        tonsil   . Heart attack Maternal Grandfather        55  . Breast  cancer Neg Hx   . Colon cancer Neg Hx   . Stomach cancer Neg Hx   . Pancreatic cancer Neg Hx   . Esophageal cancer Neg Hx    Past Surgical History:  Procedure Laterality Date  . EYE SURGERY Bilateral 2017, 2018   release of optic pressure   . FUNCTIONAL ENDOSCOPIC SINUS SURGERY Bilateral 05/2015  . Orbital Decompression Bilateral 05/2015   Social History   Social History Narrative   Original from [employer]    Lives w/ husband   Immunization History  Administered Date(s) Administered  . Influenza,inj,Quad PF,6+ Mos 12/24/2018  . Influenza-Unspecified 12/19/2019, 12/10/2021, 01/05/2023  . PFIZER(Purple Top)SARS-COV-2 Vaccination 07/04/2019, 07/25/2019, 03/10/2020  . Tdap 07/30/2012, 01/20/2023  . Zoster Recombinant(Shingrix) 09/01/2021     Objective: Vital Signs: LMP 05/16/2016    Physical Exam   Musculoskeletal Exam: ***  CDAI Exam: CDAI Score: -- Patient Global: --; Provider Global: -- Swollen: --; Tender: -- Joint Exam 03/07/2023   No joint exam has been documented for this visit   There is currently no information documented on the homunculus. Go to the Rheumatology activity and complete the homunculus joint exam.  Investigation: No additional findings.  Imaging: No results found.  Recent Labs: Lab Results  Component Value Date   WBC 5.0 01/24/2023   HGB 13.4 01/24/2023   PLT 248.0 01/24/2023   NA 140 01/24/2023   K 4.1 01/24/2023   CL 103 01/24/2023   CO2 28 01/24/2023  GLUCOSE 102 (H) 01/24/2023   BUN 10 01/24/2023   CREATININE 0.63 01/24/2023   BILITOT 0.7 01/24/2023   ALKPHOS 139 (H) 01/24/2023   AST 30 01/24/2023   ALT 33 01/24/2023   PROT 7.7 01/24/2023   ALBUMIN 4.5 01/24/2023   CALCIUM 10.0 01/24/2023   GFRAA 117 09/16/2020    Speciality Comments: PLQ Eye Exam: 05/27/2022 WNL@ [facility] Follow up in 1 year  Procedures:  No procedures performed Allergies: Patient has no known allergies.   Assessment / Plan:     Visit Diagnoses:  Sjogren's syndrome with keratoconjunctivitis sicca (HCC)  High risk medication use  Elevated LFTs  Raynaud's disease without gangrene  Osteopenia of multiple sites  History of vitamin D deficiency  Chronic right shoulder pain  Primary insomnia  History of Graves' disease  Orders: No orders of the defined types were placed in this encounter.  No orders of the defined types were placed in this encounter.   Face-to-face time spent with patient was *** minutes. Greater than 50% of time was spent in counseling and coordination of care.  Follow-Up Instructions: No follow-ups on file.   Gearldine Bienenstock, PA-C  Note - This record has been created using Dragon software.  Chart creation errors have been sought, but may not always  have been located. Such creation errors do not reflect on  the standard of medical care.

## 2023-02-24 ENCOUNTER — Other Ambulatory Visit: Payer: Self-pay

## 2023-02-24 ENCOUNTER — Encounter (HOSPITAL_BASED_OUTPATIENT_CLINIC_OR_DEPARTMENT_OTHER): Payer: Self-pay | Admitting: General Surgery

## 2023-02-24 ENCOUNTER — Ambulatory Visit (HOSPITAL_BASED_OUTPATIENT_CLINIC_OR_DEPARTMENT_OTHER): Payer: Managed Care, Other (non HMO) | Admitting: Anesthesiology

## 2023-02-24 ENCOUNTER — Ambulatory Visit (HOSPITAL_BASED_OUTPATIENT_CLINIC_OR_DEPARTMENT_OTHER)
Admission: RE | Admit: 2023-02-24 | Discharge: 2023-02-24 | Disposition: A | Discharge status: Home / Self Care | Payer: Managed Care, Other (non HMO) | Attending: General Surgery | Admitting: General Surgery

## 2023-02-24 ENCOUNTER — Encounter (HOSPITAL_BASED_OUTPATIENT_CLINIC_OR_DEPARTMENT_OTHER)
Admission: RE | Disposition: A | Discharge status: Home / Self Care | Payer: Self-pay | Source: Home / Self Care | Attending: General Surgery

## 2023-02-24 DIAGNOSIS — D1779 Benign lipomatous neoplasm of other sites: Secondary | ICD-10-CM | POA: Diagnosis present

## 2023-02-24 DIAGNOSIS — D1721 Benign lipomatous neoplasm of skin and subcutaneous tissue of right arm: Secondary | ICD-10-CM | POA: Diagnosis not present

## 2023-02-24 DIAGNOSIS — Z01818 Encounter for other preprocedural examination: Secondary | ICD-10-CM

## 2023-02-24 HISTORY — PX: LIPOMA EXCISION: SHX5283

## 2023-02-24 SURGERY — EXCISION LIPOMA
Anesthesia: General | Site: Axilla | Laterality: Right

## 2023-02-24 MED ORDER — OXYCODONE HCL 5 MG PO TABS: 5.0000 mg | ORAL_TABLET | Freq: Four times a day (QID) | ORAL | 0 refills | Status: DC | PRN

## 2023-02-24 MED ORDER — FENTANYL CITRATE (PF) 100 MCG/2ML IJ SOLN: 25.0000 ug | INTRAMUSCULAR | Status: DC | PRN

## 2023-02-24 MED ORDER — CHLORHEXIDINE GLUCONATE CLOTH 2 % EX PADS: 6.0000 | MEDICATED_PAD | Freq: Once | CUTANEOUS | Status: DC

## 2023-02-24 MED ORDER — ONDANSETRON HCL 4 MG/2ML IJ SOLN
INTRAMUSCULAR | Status: DC | PRN
  Administered 2023-02-24: 4 mg via INTRAVENOUS

## 2023-02-24 MED ORDER — CEFAZOLIN SODIUM-DEXTROSE 2-4 GM/100ML-% IV SOLN
2.0000 g | INTRAVENOUS | Status: AC
  Administered 2023-02-24: 2 g via INTRAVENOUS

## 2023-02-24 MED ORDER — 0.9 % SODIUM CHLORIDE (POUR BTL) OPTIME
TOPICAL | Status: DC | PRN
Start: 2023-02-24 — End: 2023-02-24
  Administered 2023-02-24: 150 mL

## 2023-02-24 MED ORDER — MIDAZOLAM HCL 2 MG/2ML IJ SOLN
INTRAMUSCULAR | Status: AC
  Filled 2023-02-24: qty 2

## 2023-02-24 MED ORDER — BUPIVACAINE-EPINEPHRINE 0.25% -1:200000 IJ SOLN
INTRAMUSCULAR | Status: DC | PRN
  Administered 2023-02-24: 17 mL

## 2023-02-24 MED ORDER — LIDOCAINE 2% (20 MG/ML) 5 ML SYRINGE
INTRAMUSCULAR | Status: AC
  Filled 2023-02-24: qty 5

## 2023-02-24 MED ORDER — GABAPENTIN 100 MG PO CAPS
ORAL_CAPSULE | ORAL | Status: AC
  Filled 2023-02-24: qty 1

## 2023-02-24 MED ORDER — PROPOFOL 10 MG/ML IV BOLUS
INTRAVENOUS | Status: DC | PRN
  Administered 2023-02-24: 150 mg via INTRAVENOUS

## 2023-02-24 MED ORDER — DEXAMETHASONE SODIUM PHOSPHATE 10 MG/ML IJ SOLN
INTRAMUSCULAR | Status: DC | PRN
  Administered 2023-02-24: 5 mg via INTRAVENOUS

## 2023-02-24 MED ORDER — KETOROLAC TROMETHAMINE 15 MG/ML IJ SOLN
INTRAMUSCULAR | Status: DC | PRN
  Administered 2023-02-24: 15 mg via INTRAVENOUS

## 2023-02-24 MED ORDER — ACETAMINOPHEN 500 MG PO TABS
1000.0000 mg | ORAL_TABLET | Freq: Once | ORAL | Status: AC
Start: 2023-02-24 — End: 2023-02-24

## 2023-02-24 MED ORDER — FENTANYL CITRATE (PF) 100 MCG/2ML IJ SOLN
INTRAMUSCULAR | Status: AC
  Filled 2023-02-24: qty 2

## 2023-02-24 MED ORDER — ACETAMINOPHEN 500 MG PO TABS
ORAL_TABLET | ORAL | Status: AC
  Filled 2023-02-24: qty 1

## 2023-02-24 MED ORDER — GABAPENTIN 100 MG PO CAPS
100.0000 mg | ORAL_CAPSULE | ORAL | Status: AC
  Administered 2023-02-24: 100 mg via ORAL

## 2023-02-24 MED ORDER — CEFAZOLIN SODIUM-DEXTROSE 2-4 GM/100ML-% IV SOLN
INTRAVENOUS | Status: AC
Start: 2023-02-24 — End: ?
  Filled 2023-02-24: qty 100

## 2023-02-24 MED ORDER — LACTATED RINGERS IV SOLN
INTRAVENOUS | Status: DC
Start: 2023-02-24 — End: 2023-02-24

## 2023-02-24 MED ORDER — MIDAZOLAM HCL 5 MG/5ML IJ SOLN
INTRAMUSCULAR | Status: DC | PRN
  Administered 2023-02-24: 2 mg via INTRAVENOUS

## 2023-02-24 MED ORDER — LIDOCAINE 2% (20 MG/ML) 5 ML SYRINGE
INTRAMUSCULAR | Status: DC | PRN
  Administered 2023-02-24: 60 mg via INTRAVENOUS

## 2023-02-24 MED ORDER — ACETAMINOPHEN 500 MG PO TABS
1000.0000 mg | ORAL_TABLET | ORAL | Status: AC
  Administered 2023-02-24: 1000 mg via ORAL

## 2023-02-24 MED ORDER — FENTANYL CITRATE (PF) 250 MCG/5ML IJ SOLN
INTRAMUSCULAR | Status: DC | PRN
  Administered 2023-02-24 (×2): 25 ug via INTRAVENOUS
  Administered 2023-02-24: 50 ug via INTRAVENOUS

## 2023-02-24 SURGICAL SUPPLY — 45 items
APPLIER CLIP 9.375 MED OPEN (MISCELLANEOUS) ×1
BLADE SURG 10 STRL SS (BLADE) ×1 IMPLANT
BLADE SURG 15 STRL LF DISP TIS (BLADE) ×1 IMPLANT
BLADE SURG 15 STRL SS (BLADE) ×1
CANISTER SUCT 1200ML W/VALVE (MISCELLANEOUS) IMPLANT
CHLORAPREP W/TINT 26 (MISCELLANEOUS) ×1 IMPLANT
CLIP APPLIE 9.375 MED OPEN (MISCELLANEOUS) IMPLANT
COVER BACK TABLE 60X90IN (DRAPES) ×1 IMPLANT
COVER MAYO STAND STRL (DRAPES) ×1 IMPLANT
DERMABOND ADVANCED .7 DNX12 (GAUZE/BANDAGES/DRESSINGS) ×1 IMPLANT
DRAPE LAPAROTOMY 100X72 PEDS (DRAPES) ×1 IMPLANT
DRAPE UTILITY XL STRL (DRAPES) ×1 IMPLANT
ELECT COATED BLADE 2.86 ST (ELECTRODE) ×1 IMPLANT
ELECT REM PT RETURN 9FT ADLT (ELECTROSURGICAL) ×1
ELECTRODE REM PT RTRN 9FT ADLT (ELECTROSURGICAL) ×1 IMPLANT
GAUZE 4X4 16PLY ~~LOC~~+RFID DBL (SPONGE) IMPLANT
GLOVE BIO SURGEON STRL SZ7.5 (GLOVE) ×1 IMPLANT
GOWN STRL REUS W/ TWL LRG LVL3 (GOWN DISPOSABLE) ×2 IMPLANT
GOWN STRL REUS W/TWL LRG LVL3 (GOWN DISPOSABLE) ×2
NDL HYPO 25X1 1.5 SAFETY (NEEDLE) IMPLANT
NEEDLE HYPO 25X1 1.5 SAFETY (NEEDLE)
NS IRRIG 1000ML POUR BTL (IV SOLUTION) ×1 IMPLANT
PACK BASIN DAY SURGERY FS (CUSTOM PROCEDURE TRAY) ×1 IMPLANT
PENCIL SMOKE EVACUATOR (MISCELLANEOUS) ×1 IMPLANT
SLEEVE SCD COMPRESS KNEE MED (STOCKING) ×1 IMPLANT
SPIKE FLUID TRANSFER (MISCELLANEOUS) IMPLANT
SPONGE T-LAP 18X18 ~~LOC~~+RFID (SPONGE) ×1 IMPLANT
SUT CHROMIC 3 0 SH 27 (SUTURE) IMPLANT
SUT ETHILON 3 0 PS 1 (SUTURE) IMPLANT
SUT MON AB 4-0 PC3 18 (SUTURE) IMPLANT
SUT PROLENE 3 0 PS 2 (SUTURE) IMPLANT
SUT SILK 2 0 PERMA HAND 18 BK (SUTURE) IMPLANT
SUT VIC AB 3-0 54X BRD REEL (SUTURE) IMPLANT
SUT VIC AB 3-0 BRD 54 (SUTURE)
SUT VIC AB 3-0 FS2 27 (SUTURE) IMPLANT
SUT VIC AB 3-0 SH 27 (SUTURE) ×1
SUT VIC AB 3-0 SH 27X BRD (SUTURE) IMPLANT
SUT VIC AB 4-0 PS2 18 (SUTURE) IMPLANT
SUT VIC AB 4-0 RB1 27 (SUTURE)
SUT VIC AB 4-0 RB1 27X BRD (SUTURE) IMPLANT
SUT VICRYL AB 3 0 TIES (SUTURE) IMPLANT
SYR CONTROL 10ML LL (SYRINGE) IMPLANT
TOWEL GREEN STERILE FF (TOWEL DISPOSABLE) ×2 IMPLANT
TUBE CONNECTING 20X1/4 (TUBING) IMPLANT
YANKAUER SUCT BULB TIP NO VENT (SUCTIONS) IMPLANT

## 2023-02-24 NOTE — Anesthesia Procedure Notes (Signed)
Procedure Name: LMA Insertion Date/Time: 02/24/2023 3:40 PM  Performed by: Demetrio Lapping, CRNAPre-anesthesia Checklist: Patient identified, Emergency Drugs available, Suction available and Patient being monitored Patient Re-evaluated:Patient Re-evaluated prior to induction Oxygen Delivery Method: Circle System Utilized Preoxygenation: Pre-oxygenation with 100% oxygen Induction Type: IV induction Ventilation: Mask ventilation without difficulty LMA: LMA inserted LMA Size: 3.0 Number of attempts: 1 Airway Equipment and Method: Bite block Placement Confirmation: positive ETCO2 Tube secured with: Tape Dental Injury: Teeth and Oropharynx as per pre-operative assessment

## 2023-02-24 NOTE — H&P (Signed)
REFERRING PHYSICIAN: Saguier, Bayard Beaver, PA PROVIDER: Lindell Noe, MD MRN: [id number] DOB: 05-22-1970 Subjective   Chief Complaint: New Consultation (Lipoma of right axilla)  History of Present Illness: Cynthia Fox is a 52 y.o. female who is seen today as an office consultation for evaluation of New Consultation (Lipoma of right axilla)  We are asked to see the patient in consultation by Dr. Porfirio Oar to evaluate her for a lipoma of the right axilla. The patient is a 52 year old female who states that she has had a lump in the right axilla for at least the last 5 years. It does cause her some discomfort. She feels as though it has gotten larger during that time. She would now like to have this removed. She denies any weakness or numbness in the right arm. She does have some issues with tendinitis in her right shoulder. She is otherwise in good health and does not smoke.  Review of Systems: A complete review of systems was obtained from the patient. I have reviewed this information and discussed as appropriate with the patient. See HPI as well for other ROS.  ROS   Medical History: History reviewed. No pertinent past medical history.  Patient Active Problem List  Diagnosis  Lipoma of right axilla   Past Surgical History:  Procedure Laterality Date  ORBITAL DECOMPRESSION    No Known Allergies  Current Outpatient Medications on File Prior to Visit  Medication Sig Dispense Refill  calcium carbonate 500 mg calcium (1,250 mg) tablet Take 1 tablet by mouth  cholecalciferol (VITAMIN D3) 5,000 unit capsule Take 1 capsule by mouth  cycloSPORINE (RESTASIS) 0.05 % ophthalmic emulsion Place 1 drop into both eyes 2 (two) times daily  hydroxychloroquine (PLAQUENIL) 200 mg tablet TAKE 1 TABLET(200 MG) BY MOUTH DAILY  naproxen (NAPROSYN) 500 MG tablet Take 500 mg by mouth 2 (two) times daily with meals  pilocarpine (SALAGEN) 5 mg tablet Take 5 mg by mouth 2 (two) times daily as needed   No  current facility-administered medications on file prior to visit.   Family History  Problem Relation Age of Onset  High blood pressure (Hypertension) Mother  Diabetes Father    Social History   Tobacco Use  Smoking Status Never  Smokeless Tobacco Never    Social History   Socioeconomic History  Marital status: Married  Tobacco Use  Smoking status: Never  Smokeless tobacco: Never  Vaping Use  Vaping status: Never Used  Substance and Sexual Activity  Alcohol use: Yes  Drug use: Never   Social Drivers of Health   Received from [employer]  Social Network   Objective:   Vitals:  BP: 128/80  Pulse: 77  Temp: 36.9 C (98.4 F)  SpO2: 99%  Weight: 75 kg (165 lb 6.4 oz)  Height: 152.4 cm (5')  PainSc: 0-No pain   Body mass index is 32.3 kg/m.  Physical Exam Vitals reviewed.  Constitutional:  General: She is not in acute distress. Appearance: Normal appearance.  HENT:  Head: Normocephalic and atraumatic.  Right Ear: External ear normal.  Left Ear: External ear normal.  Nose: Nose normal.  Mouth/Throat:  Mouth: Mucous membranes are moist.  Pharynx: Oropharynx is clear.  Eyes:  General: No scleral icterus. Extraocular Movements: Extraocular movements intact.  Conjunctiva/sclera: Conjunctivae normal.  Pupils: Pupils are equal, round, and reactive to light.  Cardiovascular:  Rate and Rhythm: Normal rate and regular rhythm.  Pulses: Normal pulses.  Heart sounds: Normal heart sounds.  Pulmonary:  Effort: Pulmonary  effort is normal. No respiratory distress.  Breath sounds: Normal breath sounds.  Abdominal:  General: Bowel sounds are normal.  Palpations: Abdomen is soft.  Tenderness: There is no abdominal tenderness.  Musculoskeletal:  General: No swelling, tenderness or deformity. Normal range of motion.  Cervical back: Normal range of motion and neck supple.  Skin: General: Skin is warm and dry.  Coloration: Skin is not jaundiced.  Comments: There  is a 5 to 6 cm fatty feeling mass in the right axilla consistent with a lipoma  Neurological:  General: No focal deficit present.  Mental Status: She is alert and oriented to person, place, and time.  Psychiatric:  Mood and Affect: Mood normal.  Behavior: Behavior normal.     Labs, Imaging and Diagnostic Testing:  Assessment and Plan:   Diagnoses and all orders for this visit:  Lipoma of right axilla - [facility] Case Posting Request; Future   The patient appears to have a 5 to 6 cm lipoma in the right axilla that is causing her some discomfort. Because of this I feel it would be reasonable to remove it. She would also like to have this done. I have discussed with her in detail the risks and benefits of the operation to remove the lipoma as well as some of the technical aspects and she understands and wishes to proceed. She understands that removing this mass may not resolve her tenderness

## 2023-02-24 NOTE — Anesthesia Preprocedure Evaluation (Addendum)
Anesthesia Evaluation  Patient identified by MRN, date of birth, ID band Patient awake    Reviewed: Allergy & Precautions, NPO status , Patient's Chart, lab work & pertinent test results  Airway Mallampati: I  TM Distance: >3 FB Neck ROM: Full    Dental no notable dental hx. (+) Teeth Intact, Dental Advisory Given   Pulmonary neg pulmonary ROS   Pulmonary exam normal breath sounds clear to auscultation       Cardiovascular negative cardio ROS Normal cardiovascular exam Rhythm:Regular Rate:Normal     Neuro/Psych negative neurological ROS  negative psych ROS   GI/Hepatic negative GI ROS,,,(+) Hepatitis -, A  Endo/Other  negative endocrine ROS  Grave's disease  Renal/GU negative Renal ROS  negative genitourinary   Musculoskeletal negative musculoskeletal ROS (+)    Abdominal   Peds  Hematology negative hematology ROS (+)   Anesthesia Other Findings Sjogren's  Reproductive/Obstetrics                             Anesthesia Physical Anesthesia Plan  ASA: 2  Anesthesia Plan: General   Post-op Pain Management: Tylenol PO (pre-op)*   Induction: Intravenous  PONV Risk Score and Plan: 3 and Ondansetron, Dexamethasone and Midazolam  Airway Management Planned: LMA  Additional Equipment:   Intra-op Plan:   Post-operative Plan: Extubation in OR  Informed Consent: I have reviewed the patients History and Physical, chart, labs and discussed the procedure including the risks, benefits and alternatives for the proposed anesthesia with the patient or authorized representative who has indicated his/her understanding and acceptance.     Dental advisory given  Plan Discussed with: CRNA  Anesthesia Plan Comments:        Anesthesia Quick Evaluation

## 2023-02-24 NOTE — Op Note (Signed)
02/24/2023  4:19 PM  PATIENT:  Cynthia Fox  52 y.o. female  PRE-OPERATIVE DIAGNOSIS:  LIPOMA RIGHT AXILLA  POST-OPERATIVE DIAGNOSIS:  LIPOMA RIGHT AXILLA  PROCEDURE:  Procedure(s): EXCISION LIPOMA RIGHT AXILLA (Right)  SURGEON:  Surgeons and Role:    * Griselda Miner, MD - Primary  PHYSICIAN ASSISTANT:   ASSISTANTS: none   ANESTHESIA:   local and general  EBL:  minimal   BLOOD ADMINISTERED:none  DRAINS: none   LOCAL MEDICATIONS USED:  MARCAINE     SPECIMEN:  Source of Specimen:  lipoma right axilla  DISPOSITION OF SPECIMEN:  PATHOLOGY  COUNTS:  YES  TOURNIQUET:  * No tourniquets in log *  DICTATION: .Dragon Dictation  After informed consent was obtained the patient was brought to the operating room and placed in the supine position on the operating table.  After adequate induction of general anesthesia the patient's right axilla was prepped with ChloraPrep, allowed to dry, and draped in usual sterile manner.  An appropriate timeout was performed.  The patient had a palpable lipoma in the right axilla.  The area overlying this was infiltrated with quarter percent Marcaine.  A small transversely oriented incision was made with a 15 blade knife.  The incision was carried through the skin and subcutaneous tissue sharply with the electrocautery until the lipoma was encountered.  The lipoma was separated from the rest of the axillary tissue by blunt hemostat dissection and some sharp dissection with the electrocautery.  Once the entire lipoma was removed it measured 8 x 4 cm.  It was sent to pathology for further evaluation.  Hemostasis was achieved using the Bovie electrocautery.  The wound was irrigated with saline and infiltrated with more quarter percent Marcaine.  The deep layer of the incision was closed with interrupted 3-0 Vicryl stitches.  The skin was closed with interrupted 4-0 Monocryl subcuticular stitches.  Dermabond dressings were applied.  The patient tolerated  the procedure well.  At the end of the case all needle sponge and instrument counts were correct.  The patient was then awakened and taken to recovery in stable condition.  PLAN OF CARE: Discharge to home after PACU  PATIENT DISPOSITION:  PACU - hemodynamically stable.   Delay start of Pharmacological VTE agent (>24hrs) due to surgical blood loss or risk of bleeding: not applicable

## 2023-02-24 NOTE — Transfer of Care (Signed)
Immediate Anesthesia Transfer of Care Note  Patient: Cynthia Fox  Procedure(s) Performed: EXCISION LIPOMA RIGHT AXILLA (Right: Axilla)  Patient Location: PACU  Anesthesia Type:General  Level of Consciousness: drowsy  Airway & Oxygen Therapy: Patient Spontanous Breathing and Patient connected to face mask oxygen  Post-op Assessment: Report given to RN and Post -op Vital signs reviewed and stable  Post vital signs: Reviewed and stable  Last Vitals:  Vitals Value Taken Time  BP 101/59   Temp    Pulse 81 02/24/23 1627  Resp 14   SpO2 99 % 02/24/23 1627  Vitals shown include unfiled device data.  Last Pain:  Vitals:   02/24/23 1301  TempSrc: Temporal  PainSc: 0-No pain      Patients Stated Pain Goal: 3 (02/24/23 1301)  Complications: No notable events documented.

## 2023-02-24 NOTE — Interval H&P Note (Signed)
History and Physical Interval Note:  02/24/2023 3:14 PM  Cynthia Fox  has presented today for surgery, with the diagnosis of LIPOMA RIGHT AXILLA.  The various methods of treatment have been discussed with the patient and family. After consideration of risks, benefits and other options for treatment, the patient has consented to  Procedure(s): EXCISION LIPOMA RIGHT AXILLA (Right) as a surgical intervention.  The patient's history has been reviewed, patient examined, no change in status, stable for surgery.  I have reviewed the patient's chart and labs.  Questions were answered to the patient's satisfaction.     Chevis Pretty III

## 2023-02-24 NOTE — Discharge Instructions (Signed)
No tylenol until 7:00 p.m.  Post Anesthesia Home Care Instructions  Activity: Get plenty of rest for the remainder of the day. A responsible individual must stay with you for 24 hours following the procedure.  For the next 24 hours, DO NOT: -Drive a car -Advertising copywriter -Drink alcoholic beverages -Take any medication unless instructed by your physician -Make any legal decisions or sign important papers.  Meals: Start with liquid foods such as gelatin or soup. Progress to regular foods as tolerated. Avoid greasy, spicy, heavy foods. If nausea and/or vomiting occur, drink only clear liquids until the nausea and/or vomiting subsides. Call your physician if vomiting continues.  Special Instructions/Symptoms: Your throat may feel dry or sore from the anesthesia or the breathing tube placed in your throat during surgery. If this causes discomfort, gargle with warm salt water. The discomfort should disappear within 24 hours.  If you had a scopolamine patch placed behind your ear for the management of post- operative nausea and/or vomiting:  1. The medication in the patch is effective for 72 hours, after which it should be removed.  Wrap patch in a tissue and discard in the trash. Wash hands thoroughly with soap and water. 2. You may remove the patch earlier than 72 hours if you experience unpleasant side effects which may include dry mouth, dizziness or visual disturbances. 3. Avoid touching the patch. Wash your hands with soap and water after contact with the patch.

## 2023-02-27 ENCOUNTER — Encounter (HOSPITAL_BASED_OUTPATIENT_CLINIC_OR_DEPARTMENT_OTHER): Payer: Self-pay | Admitting: General Surgery

## 2023-02-27 LAB — SURGICAL PATHOLOGY

## 2023-02-27 NOTE — Anesthesia Postprocedure Evaluation (Signed)
Anesthesia Post Note  Patient: ABENI BOWES  Procedure(s) Performed: EXCISION LIPOMA RIGHT AXILLA (Right: Axilla)     Patient location during evaluation: PACU Anesthesia Type: General Level of consciousness: awake and alert Pain management: pain level controlled Vital Signs Assessment: post-procedure vital signs reviewed and stable Respiratory status: spontaneous breathing, nonlabored ventilation, respiratory function stable and patient connected to nasal cannula oxygen Cardiovascular status: blood pressure returned to baseline and stable Postop Assessment: no apparent nausea or vomiting Anesthetic complications: no  No notable events documented.  Last Vitals:  Vitals:   02/24/23 1645 02/24/23 1658  BP: 115/68 123/70  Pulse: 80 85  Resp: 16 16  Temp:  37 C  SpO2: 97% 95%    Last Pain:  Vitals:   02/27/23 0944  TempSrc:   PainSc: 0-No pain   Pain Goal: Patients Stated Pain Goal: 3 (02/24/23 1301)                 Onelia Cadmus L Dominick Morella

## 2023-03-07 ENCOUNTER — Ambulatory Visit (INDEPENDENT_AMBULATORY_CARE_PROVIDER_SITE_OTHER): Payer: Managed Care, Other (non HMO) | Admitting: Internal Medicine

## 2023-03-07 ENCOUNTER — Telehealth: Payer: Self-pay | Admitting: Internal Medicine

## 2023-03-07 ENCOUNTER — Encounter: Payer: Self-pay | Admitting: Internal Medicine

## 2023-03-07 ENCOUNTER — Ambulatory Visit: Payer: 59 | Admitting: Physician Assistant

## 2023-03-07 VITALS — BP 126/80 | HR 77 | Temp 98.1°F | Resp 16 | Ht 61.0 in | Wt 161.2 lb

## 2023-03-07 DIAGNOSIS — F5101 Primary insomnia: Secondary | ICD-10-CM

## 2023-03-07 DIAGNOSIS — Z8639 Personal history of other endocrine, nutritional and metabolic disease: Secondary | ICD-10-CM

## 2023-03-07 DIAGNOSIS — G8929 Other chronic pain: Secondary | ICD-10-CM

## 2023-03-07 DIAGNOSIS — R7989 Other specified abnormal findings of blood chemistry: Secondary | ICD-10-CM

## 2023-03-07 DIAGNOSIS — R42 Dizziness and giddiness: Secondary | ICD-10-CM

## 2023-03-07 DIAGNOSIS — I7300 Raynaud's syndrome without gangrene: Secondary | ICD-10-CM

## 2023-03-07 DIAGNOSIS — M3501 Sicca syndrome with keratoconjunctivitis: Secondary | ICD-10-CM

## 2023-03-07 DIAGNOSIS — M8589 Other specified disorders of bone density and structure, multiple sites: Secondary | ICD-10-CM

## 2023-03-07 DIAGNOSIS — Z79899 Other long term (current) drug therapy: Secondary | ICD-10-CM

## 2023-03-07 NOTE — Telephone Encounter (Signed)
Appt today w/ Dr. Drue Novel

## 2023-03-07 NOTE — Progress Notes (Unsigned)
Subjective:    Patient ID: Cynthia Fox, female    DOB: 1970-08-04, 52 y.o.   MRN: [id number]  DOS:  03/07/2023 Type of visit - description: Acute  Her chief complaint is dizziness. This started yesterday, it last few seconds, triggered by moving around, triggered by standing up. No associated nausea or vomiting. Denies diplopia, slurred speech, motor deficits. Denies diarrhea or blood in the stools.  This morning she felt slightly achy and wonders if this is the beginning of a URI.  Also recently had surgery, was prescribed approximately 15 tablets of oxycodone.  Took the last tablet a week ago. Since then has developed some insomnia.  She wonders if it is related.  Review of Systems See above   Past Medical History:  Diagnosis Date   Compressive optic neuropathy    Graves disease    Hepatitis A    Seasonal allergies    Sjogren's disease (HCC)    positive ANA nad Ro   Thyroid disease    Thyroid eye disease     Past Surgical History:  Procedure Laterality Date   EYE SURGERY Bilateral 2017, 2018   release of optic pressure    FUNCTIONAL ENDOSCOPIC SINUS SURGERY Bilateral 05/2015   LIPOMA EXCISION Right 02/24/2023   Procedure: EXCISION LIPOMA RIGHT AXILLA;  Surgeon: Griselda Miner, MD;  Location: [facility];  Service: General;  Laterality: Right;   Orbital Decompression Bilateral 05/2015    Current Outpatient Medications  Medication Instructions   calcium carbonate (OS-CAL - DOSED IN MG OF ELEMENTAL CALCIUM) 1250 (500 Ca) MG tablet 1 tablet, Oral   hydroxychloroquine (PLAQUENIL) 200 MG tablet TAKE 1 TABLET(200 MG) BY MOUTH DAILY   oxyCODONE (ROXICODONE) 5 mg, Oral, Every 6 hours PRN   VITAMIN D PO Oral, 2 times weekly       Objective:   Physical Exam BP 126/80   Pulse 77   Temp 98.1 F (36.7 C) (Oral)   Resp 16   Ht 5\' 1"  (1.549 m)   Wt 161 lb 4 oz (73.1 kg)   LMP 05/16/2016   SpO2 97%   BMI 30.47 kg/m  General:   Well developed, NAD,  BMI noted. HEENT:  Normocephalic . Face symmetric, atraumatic. EOMI, pupils equal and reactive Lungs:  CTA B Normal respiratory effort, no intercostal retractions, no accessory muscle use. Heart: RRR,  no murmur.  Lower extremities: no pretibial edema bilaterally  Skin: Not pale. Not jaundice Neurologic:  alert & oriented X3.  Speech normal, gait appropriate for age and unassisted. Motor symmetric. Psych--  Cognition and judgment appear intact.  Cooperative with normal attention span and concentration.  Behavior appropriate. No anxious or depressed appearing.      Assessment     Assessment ENDO:    Dr Elvera Lennox --Luiz Blare' disease dx 2016, ophthalmopathy dx ~ 04-2015 , had diplopia, s/p orbital decompression surgery ~ 05-2015   --h/o hyperprolactinemia  RHEUM:  Dr Victory Dakin  Sjogren syndrome  Dx 2016  --  + ANA Menopausal 2017   PLAN: Dizziness: As described above, no red flags, symptoms are brief and typically triggered by moving left or right. She wonders if this is related with the beginnings of a URI or by the fact that she is not sleeping well for the last few days. Orthostatic vital signs:: Negative Recommend observation, good hydration, ER if red flags: Severe dizziness, severe headache, any stroke symptoms.  She verbalized understanding. Also, okay to take Benadryl 12.5 mg at bedtime if  needed for insomnia.  She could increase it to 25 mg if needed    Here for CPX Endocrinology: Has an appointment pending with Dr. Elvera Lennox . Sjogren's, + ANA: Sees rheumatology regularly. Abnormal CBC: Increased MCV, B12 levels normal, check a folic acid. Increased LFTs: CMP and GGT. Not on  daily tylenol, no etoh (rarely) RTC 1 year

## 2023-03-07 NOTE — Telephone Encounter (Signed)
Patient called and stated that she was experiencing dizziness in the mornings and having sxs of a cold. She wanted to be seen very soon so I added her to PCP's schedule for 2pm and routed the call to a triage nurse to confirm if ok to wait. Please advise.

## 2023-03-07 NOTE — Telephone Encounter (Signed)
Initial Comment Caller states that she is dizzy. Translation No Nurse Assessment Nurse: Stefano Gaul, RN, Vera Date/Time (Eastern Time): 03/07/2023 1:16:24 PM Confirm and document reason for call. If symptomatic, describe symptoms. ---Caller states she is dizzy. Dizziness started yesterday. She had lipoma removed from her axilla on November 15th. Does the patient have any new or worsening symptoms? ---Yes Will a triage be completed? ---Yes Related visit to physician within the last 2 weeks? ---No Does the PT have any chronic conditions? (i.e. diabetes, asthma, this includes High risk factors for pregnancy, etc.) ---No Is the patient pregnant or possibly pregnant? (Ask all females between the ages of 66-55) ---No Is this a behavioral health or substance abuse call? ---No Guidelines Guideline Title Affirmed Question Affirmed Notes Nurse Date/Time (Eastern Time) Dizziness - Lightheadedness [1] MODERATE dizziness (e.g., interferes with normal activities) AND [2] has NOT been evaluated by doctor (or NP/PA) for this (Exception: Dizziness caused by Stefano Gaul, RN, Vera 03/07/2023 1:20:10 PM PLEASE NOTE: All timestamps contained within this report are represented as [place] Standard Time. CONFIDENTIALTY NOTICE: This fax transmission is intended only for the addressee. It contains information that is legally privileged, confidential or otherwise protected from use or disclosure. If you are not the intended recipient, you are strictly prohibited from reviewing, disclosing, copying using or disseminating any of this information or taking any action in reliance on or regarding this information. If you have received this fax in error, please notify us immediately by telephone so that we can arrange for its return to [place]. Phone: [phone], Toll-Free: [phone], Fax: [phone] Page: 2 of 2 Call Id: [id number] Guidelines Guideline Title Affirmed Question Affirmed Notes Nurse Date/Time  Lamount Cohen Time) heat exposure, sudden standing, or poor fluid intake.) Disp. Time Lamount Cohen Time) Disposition Final User 03/07/2023 1:26:16 PM See PCP within 24 Hours Yes Stefano Gaul, RN, Dwana Curd Final Disposition 03/07/2023 1:26:16 PM See PCP within 24 Hours Yes Stefano Gaul, RN, Clerance Lav Disagree/Comply Comply Caller Understands Yes PreDisposition Call Doctor Care Advice Given Per Guideline SEE PCP WITHIN 24 HOURS: * IF OFFICE WILL BE OPEN: You need to be examined within the next 24 hours. Call your doctor (or NP/PA) when the office opens and make an appointment. CALL BACK IF: * You pass out (faint) or are too weak to stand * You become worse CARE ADVICE given per Dizziness - Lightheadedness (Adult) guideline. Comments User: Art Buff, RN Date/Time Lamount Cohen Time): 03/07/2023 1:31:24 PM Called office and gave report that triage outcome see physician within 24 hrs. States that pt has appt today at 2 pm. Pt notified and verbalized understanding User: Art Buff, RN Date/Time Lamount Cohen Time): 03/07/2023 1:32:11 PM Pt called the surgery center who said her dizziness was not due to her surgery and to call PCP Referrals REFERRED TO PCP OFFICE

## 2023-03-07 NOTE — Patient Instructions (Signed)
Rest  Drink plenty of fluids  ER if: Severe dizziness, severe headache, any strokelike symptoms such as: Difficulty with your speech, weakness anywhere in your face or extremities.

## 2023-03-08 NOTE — Assessment & Plan Note (Signed)
Dizziness: As described above, no red flags, symptoms are brief and typically triggered by moving left or right. She wonders if this is related with the beginnings of a URI or by the fact that she is not sleeping well for the last few days. Orthostatic vital signs >> Negative Recommend observation, good hydration, ER if red flags: Severe dizziness, severe headache, any stroke symptoms.  She verbalized understanding. Also, okay to take Benadryl 12.5 mg at bedtime if needed for insomnia.  She could increase it to 25 mg if needed

## 2023-03-28 NOTE — Progress Notes (Deleted)
Office Visit Note  Patient: Cynthia Fox             Date of Birth: April 05, 1971           MRN: [id number]             PCP: Wanda Plump, MD Referring: Wanda Plump, MD Visit Date: 04/11/2023 Occupation: @GUAROCC @  Subjective:  No chief complaint on file.   History of Present Illness: [name] is a 52 y.o. female ***     Activities of Daily Living:  Patient reports morning stiffness for *** {minute/hour:19697}.   Patient {ACTIONS;DENIES/REPORTS:21021675::"Denies"} nocturnal pain.  Difficulty dressing/grooming: {ACTIONS;DENIES/REPORTS:21021675::"Denies"} Difficulty climbing stairs: {ACTIONS;DENIES/REPORTS:21021675::"Denies"} Difficulty getting out of chair: {ACTIONS;DENIES/REPORTS:21021675::"Denies"} Difficulty using hands for taps, buttons, cutlery, and/or writing: {ACTIONS;DENIES/REPORTS:21021675::"Denies"}  No Rheumatology ROS completed.   PMFS History:  Patient Active Problem List   Diagnosis Date Noted  . H. pylori infection, treated 06/2018 09/08/2018  . 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 Annual physical exam 10/24/2016  . Menopause 10/13/2016  . H/O seasonal allergies 03/23/2016  . Vitamin D deficiency 03/23/2016  . High risk medication use 03/22/2016  . Sjogren's syndrome (HCC) 06/25/2015  . PCP NOTES >>>>>>>>>>>>>>>>>>>>>> 06/25/2015  . Graves disease 06/01/2015  . Hyperprolactinemia (HCC) 01/12/2015  . Elevated antinuclear antibody (ANA) level 07/23/2014    Past Medical History:  Diagnosis Date  . Compressive optic neuropathy   . Graves disease   . Hepatitis A   . Seasonal allergies   . Sjogren's disease (HCC)    positive ANA nad Ro  . Thyroid disease   . Thyroid eye disease     Family History  Problem Relation Age of Onset  . Hypertension Mother   . Retinal detachment Mother   . Thyroid disease Mother   . Diabetes Father   . Thyroid disease Sister   . Other Sister        tonsil   . Heart attack Maternal Grandfather        55  . Breast  cancer Neg Hx   . Colon cancer Neg Hx   . Stomach cancer Neg Hx   . Pancreatic cancer Neg Hx   . Esophageal cancer Neg Hx    Past Surgical History:  Procedure Laterality Date  . EYE SURGERY Bilateral 2017, 2018   release of optic pressure   . FUNCTIONAL ENDOSCOPIC SINUS SURGERY Bilateral 05/2015  . LIPOMA EXCISION Right 02/24/2023   Procedure: EXCISION LIPOMA RIGHT AXILLA;  Surgeon: Griselda Miner, MD;  Location: [facility];  Service: General;  Laterality: Right;  . Orbital Decompression Bilateral 05/2015   Social History   Social History Narrative   Original from [employer]    Lives w/ husband   Immunization History  Administered Date(s) Administered  . Influenza,inj,Quad PF,6+ Mos 12/24/2018  . Influenza-Unspecified 12/19/2019, 12/10/2021, 01/05/2023  . PFIZER(Purple Top)SARS-COV-2 Vaccination 07/04/2019, 07/25/2019, 03/10/2020  . Tdap 07/30/2012, 01/20/2023  . Zoster Recombinant(Shingrix) 09/01/2021     Objective: Vital Signs: LMP 05/16/2016    Physical Exam   Musculoskeletal Exam: ***  CDAI Exam: CDAI Score: -- Patient Global: --; Provider Global: -- Swollen: --; Tender: -- Joint Exam 04/11/2023   No joint exam has been documented for this visit   There is currently no information documented on the homunculus. Go to the Rheumatology activity and complete the homunculus joint exam.  Investigation: No additional findings.  Imaging: No results found.  Recent Labs: Lab Results  Component Value Date   WBC 5.0 01/24/2023  HGB 13.4 01/24/2023   PLT 248.0 01/24/2023   NA 140 01/24/2023   K 4.1 01/24/2023   CL 103 01/24/2023   CO2 28 01/24/2023   GLUCOSE 102 (H) 01/24/2023   BUN 10 01/24/2023   CREATININE 0.63 01/24/2023   BILITOT 0.7 01/24/2023   ALKPHOS 139 (H) 01/24/2023   AST 30 01/24/2023   ALT 33 01/24/2023   PROT 7.7 01/24/2023   ALBUMIN 4.5 01/24/2023   CALCIUM 10.0 01/24/2023   GFRAA 117 09/16/2020    Speciality Comments: PLQ Eye  Exam: 05/27/2022 WNL@ [facility] Follow up in 1 year  Procedures:  No procedures performed Allergies: Patient has no known allergies.   Assessment / Plan:     Visit Diagnoses: Sjogren's syndrome with keratoconjunctivitis sicca (HCC)  High risk medication use  Raynaud's disease without gangrene  Osteopenia of multiple sites  History of vitamin D deficiency  Elevated LFTs  Primary insomnia  History of Graves' disease  Orders: No orders of the defined types were placed in this encounter.  No orders of the defined types were placed in this encounter.   Face-to-face time spent with patient was *** minutes. Greater than 50% of time was spent in counseling and coordination of care.  Follow-Up Instructions: No follow-ups on file.   Gearldine Bienenstock, PA-C  Note - This record has been created using Dragon software.  Chart creation errors have been sought, but may not always  have been located. Such creation errors do not reflect on  the standard of medical care.

## 2023-04-11 ENCOUNTER — Ambulatory Visit: Payer: Self-pay | Admitting: Physician Assistant

## 2023-04-11 DIAGNOSIS — M8589 Other specified disorders of bone density and structure, multiple sites: Secondary | ICD-10-CM

## 2023-04-11 DIAGNOSIS — R7989 Other specified abnormal findings of blood chemistry: Secondary | ICD-10-CM

## 2023-04-11 DIAGNOSIS — Z8639 Personal history of other endocrine, nutritional and metabolic disease: Secondary | ICD-10-CM

## 2023-04-11 DIAGNOSIS — I7300 Raynaud's syndrome without gangrene: Secondary | ICD-10-CM

## 2023-04-11 DIAGNOSIS — M3501 Sicca syndrome with keratoconjunctivitis: Secondary | ICD-10-CM

## 2023-04-11 DIAGNOSIS — F5101 Primary insomnia: Secondary | ICD-10-CM

## 2023-04-11 DIAGNOSIS — Z79899 Other long term (current) drug therapy: Secondary | ICD-10-CM

## 2023-04-17 NOTE — Progress Notes (Unsigned)
Office Visit Note  Patient: Cynthia Fox             Date of Birth: [birth year]           MRN: [id number]             PCP: Wanda Plump, MD Referring: Wanda Plump, MD Visit Date: 05/01/2023 Occupation: @GUAROCC @  Subjective:  Medication monitoring   History of Present Illness: Cynthia Fox is a 53 y.o. female with history of sjogren's syndrome.  Patient remains on  Plaquenil 200 mg 1 tablet by mouth daily.  She continues to tolerate Plaquenil without any side effects and has not had any recent interruptions in therapy.  She continues to have chronic sicca symptoms which have been unchanged.  She has been seeing the dentist every 6 months and has an upcoming appointment with ophthalmology in February 2025.  Patient has had an increased frequency of symptoms of Raynaud's phenomenon especially with cooler weather temperatures.  She denies any digital ulcerations.  Patient has noticed some increased arthralgias with the cooler weather temperatures but denies any joint swelling.  She denies any nocturnal pain.  Patient states for the past month she is also been experiencing insomnia.  She is been waking up at 2 to 3 AM and having difficulty falling back to sleep.  She recently started to take melatonin 10 mg at bedtime which she has found to be helpful.  Activities of Daily Living:  Patient reports morning stiffness for 0 minute.   Patient Denies nocturnal pain.  Difficulty dressing/grooming: Reports Difficulty climbing stairs: Denies Difficulty getting out of chair: Denies Difficulty using hands for taps, buttons, cutlery, and/or writing: Reports  Review of Systems  Constitutional:  Negative for fatigue.  HENT:  Positive for mouth dryness. Negative for mouth sores.   Eyes:  Positive for dryness.  Respiratory:  Negative for shortness of breath.   Cardiovascular:  Negative for chest pain and palpitations.  Gastrointestinal:  Negative for blood in stool, constipation and diarrhea.   Endocrine: Negative for increased urination.  Genitourinary:  Negative for involuntary urination.  Musculoskeletal:  Positive for muscle weakness. Negative for joint pain, gait problem, joint pain, joint swelling, myalgias, morning stiffness, muscle tenderness and myalgias.  Skin:  Negative for color change, rash, hair loss and sensitivity to sunlight.  Allergic/Immunologic: Negative for susceptible to infections.  Neurological:  Negative for dizziness and headaches.  Hematological:  Negative for swollen glands.  Psychiatric/Behavioral:  Positive for sleep disturbance. Negative for depressed mood. The patient is not nervous/anxious.     PMFS History:  Patient Active Problem List   Diagnosis Date Noted   H. 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   Annual physical exam 10/24/2016   Menopause 10/13/2016   H/O seasonal allergies 03/23/2016   Vitamin D deficiency 03/23/2016   High risk medication use 03/22/2016   Sjogren's syndrome (HCC) 06/25/2015   PCP NOTES >>>>>>>>>>>>>>>>>>>>>> 06/25/2015   Graves disease 06/01/2015   Hyperprolactinemia (HCC) 01/12/2015   Elevated antinuclear antibody (ANA) level 07/23/2014    Past Medical History:  Diagnosis Date   Compressive optic neuropathy    Graves disease    Hepatitis A    Seasonal allergies    Sjogren's disease (HCC)    positive ANA nad Ro   Thyroid disease    Thyroid eye disease     Family History  Problem Relation Age of Onset   Hypertension Mother    Retinal detachment Mother  Thyroid disease Mother    Diabetes Father    Thyroid disease Sister    Other Sister        tonsil    Heart attack Maternal Grandfather        51   Breast cancer Neg Hx    Colon cancer Neg Hx    Stomach cancer Neg Hx    Pancreatic cancer Neg Hx    Esophageal cancer Neg Hx    Past Surgical History:  Procedure Laterality Date   EYE SURGERY Bilateral 2017, 2018   release of optic pressure    FUNCTIONAL  ENDOSCOPIC SINUS SURGERY Bilateral 05/2015   LIPOMA EXCISION Right 02/24/2023   Procedure: EXCISION LIPOMA RIGHT AXILLA;  Surgeon: Griselda Miner, MD;  Location: [facility];  Service: General;  Laterality: Right;   Orbital Decompression Bilateral 05/2015   Social History   Social History Narrative   Original from [place]    Lives w/ husband   Immunization History  Administered Date(s) Administered   Influenza,inj,Quad PF,6+ Mos 12/24/2018   Influenza-Unspecified 12/19/2019, 12/10/2021, 01/05/2023   PFIZER(Purple Top)SARS-COV-2 Vaccination 07/04/2019, 07/25/2019, 03/10/2020   Tdap 07/30/2012, 01/20/2023   Zoster Recombinant(Shingrix) 09/01/2021     Objective: Vital Signs: BP 111/75 (BP Location: Left Arm, Patient Position: Sitting, Cuff Size: Large)   Pulse 80   Resp 14   Ht 5' (1.524 m)   Wt 164 lb (74.4 kg)   LMP 05/16/2016   BMI 32.03 kg/m    Physical Exam Vitals and nursing note reviewed.  Constitutional:      Appearance: She is well-developed.  HENT:     Head: Normocephalic and atraumatic.  Eyes:     Conjunctiva/sclera: Conjunctivae normal.  Cardiovascular:     Rate and Rhythm: Normal rate and regular rhythm.     Heart sounds: Normal heart sounds.  Pulmonary:     Effort: Pulmonary effort is normal.     Breath sounds: Normal breath sounds.  Abdominal:     General: Bowel sounds are normal.     Palpations: Abdomen is soft.  Musculoskeletal:     Cervical back: Normal range of motion.  Skin:    General: Skin is warm and dry.     Capillary Refill: Capillary refill takes less than 2 seconds.     Comments: Scattered telangectasia's on the palmar aspect of both hands. No signs of sclerodactyly. No digital ulcerations noted.  Neurological:     Mental Status: She is alert and oriented to person, place, and time.  Psychiatric:        Behavior: Behavior normal.      Musculoskeletal Exam: C-spine, thoracic spine, lumbar spine have good range of motion.   No midline spinal tenderness.  Shoulder joints, elbow joints, wrist joints, MCPs, PIPs, DIPs have good range of motion with no synovitis.  Complete fist formation bilaterally.  Hip joints have good range of motion with no groin pain.  Knee joints have good range of motion with no warmth or effusion.  Ankle joints have good range of motion with no tenderness or joint swelling.  CDAI Exam: CDAI Score: -- Patient Global: --; Provider Global: -- Swollen: --; Tender: -- Joint Exam 05/01/2023   No joint exam has been documented for this visit   There is currently no information documented on the homunculus. Go to the Rheumatology activity and complete the homunculus joint exam.  Investigation: No additional findings.  Imaging: No results found.  Recent Labs: Lab Results  Component Value Date  WBC 5.0 01/24/2023   HGB 13.4 01/24/2023   PLT 248.0 01/24/2023   NA 140 01/24/2023   K 4.1 01/24/2023   CL 103 01/24/2023   CO2 28 01/24/2023   GLUCOSE 102 (H) 01/24/2023   BUN 10 01/24/2023   CREATININE 0.63 01/24/2023   BILITOT 0.7 01/24/2023   ALKPHOS 139 (H) 01/24/2023   AST 30 01/24/2023   ALT 33 01/24/2023   PROT 7.7 01/24/2023   ALBUMIN 4.5 01/24/2023   CALCIUM 10.0 01/24/2023   GFRAA 117 09/16/2020    Speciality Comments: PLQ Eye Exam: 05/27/2022 WNL@ [facility] Follow up in 1 year  Procedures:  No procedures performed Allergies: Patient has no known allergies.    Assessment / Plan:     Visit Diagnoses: Sjogren's syndrome with keratoconjunctivitis sicca (HCC) - Positive ANA, positive Ro, positive rheumatoid factor, negative CCP: Patient continues to have chronic sicca symptoms which have been unchanged overall.  She has not been using a humidifier recently.  She uses Restasis eyedrops daily.  Discussed the use of over-the-counter products for mouth dryness.  Patient remains on Plaquenil 200 mg 1 tablet by mouth daily.  She is tolerating Plaquenil without any side effects  and has not had any recent gaps in therapy. Patient continues to see the dentist every 6 months and has an upcoming appointment with ophthalmology in February 2025. She continues to experience intermittent arthralgias especially with cooler weather temperatures.  She had no synovitis on examination today.  She was given a list of natural anti-inflammatories that she can try taking. Discussed the increased risk for developing lymphoma patients with Sjogren's syndrome.  IFE was normal on 08/23/2022.  ANA and Ro antibodies remain positive noted on lab work from 08/23/2022.  ESR WNL, C3 and C4 WNL, and RF negative.  The following lab work will be updated today for further evaluation.  She will remain on Plaquenil as prescribed.  A refill of Plaquenil sent to the pharmacy today.  She is advised to notify us if she develops any new or worsening symptoms.  She will follow-up in the office in 5 months or sooner if needed.- Plan: CBC with Differential/Platelet, Urinalysis, Routine w reflex microscopic, COMPLETE METABOLIC PANEL WITH GFR, C3 and C4, Sedimentation rate, Rheumatoid factor, Sjogrens syndrome-B extractable nuclear antibody, Sjogrens syndrome-A extractable nuclear antibody, Serum protein electrophoresis with reflex, hydroxychloroquine (PLAQUENIL) 200 MG tablet  High risk medication use - Plaquenil 200 mg 1 tablet by mouth daily.   PLQ Eye Exam: 05/27/2022 WNL@ [facility] Follow up in 1 year. Scheduled for updated plaquenil eye examination in February 2025.  Patient was given a Plaquenil eye examination form to take with her to her appointment. CBC and CMP updated on 01/24/23.  Orders for CBC and CMP were released today.  - Plan: CBC with Differential/Platelet, COMPLETE METABOLIC PANEL WITH GFR  Raynaud's disease without gangrene: Patient's been experiencing an increased frequency of symptoms of Raynaud's phenomenon in her fingertips with the cooler weather temperatures.  Conservative treatment options  were discussed including using hand warmers, gloves, and keeping her core body temperature warm.  On examination she has scattered Telangectasia's on the palmar aspect of both hands.  No signs of sclerodactyly.  No digital ulcerations noted.  Fingertips were warm to the touch during the office visit today.  She will notify us if she develops any new or worsening symptoms.  Osteopenia of multiple sites - DEXA updated on 01/20/22:  The BMD measured at Femur Neck Left is 0.825  g/cm2 with a T-score of -1.5.  Patient is taking a vitamin D and calcium supplement as recommended.  No recent falls or fractures. DEXA due in October 2025.  History of vitamin D deficiency: She is taking a vitamin D supplement twice weekly.  Chronic right shoulder pain: Solved.  Lipoma from right axilla excised November 2024.  Primary insomnia: Patient's been having difficulty sleeping at night for the past 1 month.  Patient recently started taking melatonin 10 mg at bedtime which has been helpful.  Other medical conditions are listed as follows:  History of Graves' disease  Elevated LFTs: AST and ALT were within normal limits on 01/24/2023.  CMP updated today.  Orders: Orders Placed This Encounter  Procedures   CBC with Differential/Platelet   Urinalysis, Routine w reflex microscopic   COMPLETE METABOLIC PANEL WITH GFR   C3 and C4   Sedimentation rate   Rheumatoid factor   Sjogrens syndrome-B extractable nuclear antibody   Sjogrens syndrome-A extractable nuclear antibody   Serum protein electrophoresis with reflex   Meds ordered this encounter  Medications   hydroxychloroquine (PLAQUENIL) 200 MG tablet    Sig: TAKE 1 TABLET(200 MG) BY MOUTH DAILY    Dispense:  90 tablet    Refill:  0     Follow-Up Instructions: Return in about 5 months (around 09/29/2023) for Sjogren's syndrome.   Gearldine Bienenstock, PA-C  Note - This record has been created using Dragon software.  Chart creation errors have been sought,  but may not always  have been located. Such creation errors do not reflect on  the standard of medical care.

## 2023-05-01 ENCOUNTER — Encounter: Payer: Self-pay | Admitting: Physician Assistant

## 2023-05-01 ENCOUNTER — Ambulatory Visit: Payer: Managed Care, Other (non HMO) | Attending: Physician Assistant | Admitting: Physician Assistant

## 2023-05-01 VITALS — BP 111/75 | HR 80 | Resp 14 | Ht 60.0 in | Wt 164.0 lb

## 2023-05-01 DIAGNOSIS — I7300 Raynaud's syndrome without gangrene: Secondary | ICD-10-CM

## 2023-05-01 DIAGNOSIS — M8589 Other specified disorders of bone density and structure, multiple sites: Secondary | ICD-10-CM | POA: Diagnosis not present

## 2023-05-01 DIAGNOSIS — M3501 Sicca syndrome with keratoconjunctivitis: Secondary | ICD-10-CM

## 2023-05-01 DIAGNOSIS — R7989 Other specified abnormal findings of blood chemistry: Secondary | ICD-10-CM

## 2023-05-01 DIAGNOSIS — Z79899 Other long term (current) drug therapy: Secondary | ICD-10-CM

## 2023-05-01 DIAGNOSIS — Z8639 Personal history of other endocrine, nutritional and metabolic disease: Secondary | ICD-10-CM

## 2023-05-01 DIAGNOSIS — M25511 Pain in right shoulder: Secondary | ICD-10-CM

## 2023-05-01 DIAGNOSIS — F5101 Primary insomnia: Secondary | ICD-10-CM

## 2023-05-01 DIAGNOSIS — G8929 Other chronic pain: Secondary | ICD-10-CM

## 2023-05-01 MED ORDER — HYDROXYCHLOROQUINE SULFATE 200 MG PO TABS: ORAL_TABLET | ORAL | 0 refills | Status: DC

## 2023-05-02 NOTE — Progress Notes (Signed)
CBC and CMP WNL.  ESR WNL.  UA normal.

## 2023-05-04 NOTE — Progress Notes (Signed)
Ro antibody remains positive. La antibody negative. RF negative. Complements WNL

## 2023-05-05 LAB — CBC WITH DIFFERENTIAL/PLATELET
Absolute Lymphocytes: 2399 {cells}/uL (ref 850–3900)
Absolute Monocytes: 735 {cells}/uL (ref 200–950)
Basophils Absolute: 39 {cells}/uL (ref 0–200)
Basophils Relative: 0.6 %
Eosinophils Absolute: 390 {cells}/uL (ref 15–500)
Eosinophils Relative: 6 %
HCT: 40 % (ref 35.0–45.0)
Hemoglobin: 13.2 g/dL (ref 11.7–15.5)
MCH: 33.1 pg — ABNORMAL HIGH (ref 27.0–33.0)
MCHC: 33 g/dL (ref 32.0–36.0)
MCV: 100.3 fL — ABNORMAL HIGH (ref 80.0–100.0)
MPV: 13.8 fL — ABNORMAL HIGH (ref 7.5–12.5)
Monocytes Relative: 11.3 %
Neutro Abs: 2938 {cells}/uL (ref 1500–7800)
Neutrophils Relative %: 45.2 %
Platelets: 248 10*3/uL (ref 140–400)
RBC: 3.99 10*6/uL (ref 3.80–5.10)
RDW: 13.1 % (ref 11.0–15.0)
Total Lymphocyte: 36.9 %
WBC: 6.5 10*3/uL (ref 3.8–10.8)

## 2023-05-05 LAB — COMPLETE METABOLIC PANEL WITH GFR
AG Ratio: 1.5 (calc) (ref 1.0–2.5)
ALT: 23 U/L (ref 6–29)
AST: 24 U/L (ref 10–35)
Albumin: 4.6 g/dL (ref 3.6–5.1)
Alkaline phosphatase (APISO): 126 U/L (ref 37–153)
BUN: 13 mg/dL (ref 7–25)
CO2: 28 mmol/L (ref 20–32)
Calcium: 9.5 mg/dL (ref 8.6–10.4)
Chloride: 105 mmol/L (ref 98–110)
Creat: 0.65 mg/dL (ref 0.50–1.03)
Globulin: 3 g/dL (ref 1.9–3.7)
Glucose, Bld: 74 mg/dL (ref 65–99)
Potassium: 3.8 mmol/L (ref 3.5–5.3)
Sodium: 140 mmol/L (ref 135–146)
Total Bilirubin: 0.6 mg/dL (ref 0.2–1.2)
Total Protein: 7.6 g/dL (ref 6.1–8.1)
eGFR: 104 mL/min/{1.73_m2} (ref >=60)

## 2023-05-05 LAB — PROTEIN ELECTROPHORESIS, SERUM, WITH REFLEX
Albumin ELP: 4.5 g/dL (ref 3.8–4.8)
Alpha 1: 0.3 g/dL (ref 0.2–0.3)
Alpha 2: 0.6 g/dL (ref 0.5–0.9)
Beta 2: 0.6 g/dL — ABNORMAL HIGH (ref 0.2–0.5)
Beta Globulin: 0.6 g/dL (ref 0.4–0.6)
Gamma Globulin: 1.1 g/dL (ref 0.8–1.7)
Total Protein: 7.6 g/dL (ref 6.1–8.1)

## 2023-05-05 LAB — C3 AND C4
C3 Complement: 133 mg/dL (ref 83–193)
C4 Complement: 26 mg/dL (ref 15–57)

## 2023-05-05 LAB — URINALYSIS, ROUTINE W REFLEX MICROSCOPIC
Bilirubin Urine: NEGATIVE
Glucose, UA: NEGATIVE
Hgb urine dipstick: NEGATIVE
Ketones, ur: NEGATIVE
Leukocytes,Ua: NEGATIVE
Nitrite: NEGATIVE
Protein, ur: NEGATIVE
Specific Gravity, Urine: 1.014 (ref 1.001–1.035)
pH: 5.5 (ref 5.0–8.0)

## 2023-05-05 LAB — SJOGRENS SYNDROME-B EXTRACTABLE NUCLEAR ANTIBODY: SSB (La) (ENA) Antibody, IgG: <1 AI

## 2023-05-05 LAB — RHEUMATOID FACTOR: Rheumatoid fact SerPl-aCnc: 13 [IU]/mL (ref <14)

## 2023-05-05 LAB — SJOGRENS SYNDROME-A EXTRACTABLE NUCLEAR ANTIBODY: SSA (Ro) (ENA) Antibody, IgG: >8 AI — AB

## 2023-05-05 LAB — IFE INTERPRETATION

## 2023-05-05 LAB — SEDIMENTATION RATE: Sed Rate: 25 mm/h (ref 0–30)

## 2023-05-05 NOTE — Progress Notes (Signed)
IFE normal.

## 2023-07-07 ENCOUNTER — Other Ambulatory Visit: Payer: Self-pay | Admitting: Rheumatology

## 2023-07-07 DIAGNOSIS — M3501 Sicca syndrome with keratoconjunctivitis: Secondary | ICD-10-CM

## 2023-08-24 ENCOUNTER — Other Ambulatory Visit: Payer: Self-pay | Admitting: Physician Assistant

## 2023-08-24 DIAGNOSIS — M3501 Sicca syndrome with keratoconjunctivitis: Secondary | ICD-10-CM

## 2023-08-28 ENCOUNTER — Other Ambulatory Visit: Payer: Self-pay

## 2023-08-28 DIAGNOSIS — M3501 Sicca syndrome with keratoconjunctivitis: Secondary | ICD-10-CM

## 2023-08-28 MED ORDER — PILOCARPINE HCL 5 MG PO TABS: 5.0000 mg | ORAL_TABLET | Freq: Two times a day (BID) | ORAL | 0 refills | Status: DC | PRN

## 2023-08-28 MED ORDER — HYDROXYCHLOROQUINE SULFATE 200 MG PO TABS: ORAL_TABLET | ORAL | 0 refills | Status: DC

## 2023-08-28 NOTE — Telephone Encounter (Signed)
 Patient contacted the office and states she would like a refill of Hydroxychloroquine  and Pilocarpine  sent to [facility] on [address] and [address].   Last Fill: 08/23/2022 Pilocarpine  05/01/2023 Hydroxychloroquine   Eye exam: 06/13/2023 WNL   Labs: 05/01/2023 CBC and CMP WNL ESR WNL UA normal Ro antibody remains positive. La antibody negative. RF negative. Complements WNL  IFE normal   Next Visit: 10/11/2023  Last Visit: 05/01/2023  [id number] syndrome with keratoconjunctivitis sicca (HCC)   Current Dose per office note 05/01/2023: Plaquenil  200 mg 1 tablet by mouth daily.   Pilocarpine  dose not discussed.   Patient contacted the office and states her pilocarpine  was denied. Advised the patient I was not sure why it was denied. Advised the patient I would work up a refill request and send it to the provider.   Okay to refill Plaquenil  and Pilocarpine ?

## 2023-09-27 NOTE — Progress Notes (Deleted)
 Office Visit Note  Patient: Cynthia Fox             Date of Birth: [birth year]           MRN: [id number]             PCP: Ezell Hollow, MD Referring: Ezell Hollow, MD Visit Date: 10/11/2023 Occupation: @GUAROCC @  Subjective:  No chief complaint on file.   History of Present Illness: Cynthia Fox is a 53 y.o. female ***     Activities of Daily Living:  Patient reports morning stiffness for *** {minute/hour:19697}.   Patient {ACTIONS;DENIES/REPORTS:21021675::Denies} nocturnal pain.  Difficulty dressing/grooming: {ACTIONS;DENIES/REPORTS:21021675::Denies} Difficulty climbing stairs: {ACTIONS;DENIES/REPORTS:21021675::Denies} Difficulty getting out of chair: {ACTIONS;DENIES/REPORTS:21021675::Denies} Difficulty using hands for taps, buttons, cutlery, and/or writing: {ACTIONS;DENIES/REPORTS:21021675::Denies}  No Rheumatology ROS completed.   PMFS History:  Patient Active Problem List   Diagnosis Date Noted  . H. pylori infection, treated 06/2018 09/08/2018  . 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 Annual physical exam 10/24/2016  . Menopause 10/13/2016  . H/O seasonal allergies 03/23/2016  . Vitamin D  deficiency 03/23/2016  . High risk medication use 03/22/2016  . Sjogren's syndrome (HCC) 06/25/2015  . PCP NOTES >>>>>>>>>>>>>>>>>>>>>> 06/25/2015  . Graves disease 06/01/2015  . Hyperprolactinemia (HCC) 01/12/2015  . Elevated antinuclear antibody (ANA) level 07/23/2014    Past Medical History:  Diagnosis Date  . Compressive optic neuropathy   . Graves disease   . Hepatitis A   . Seasonal allergies   . Sjogren's disease (HCC)    positive ANA nad Ro  . Thyroid  disease   . Thyroid  eye disease     Family History  Problem Relation Age of Onset  . Hypertension Mother   . Retinal detachment Mother   . Thyroid  disease Mother   . Diabetes Father   . Thyroid  disease Sister   . Other Sister        tonsil   . Heart attack Maternal Grandfather        55  . Breast  cancer Neg Hx   . Colon cancer Neg Hx   . Stomach cancer Neg Hx   . Pancreatic cancer Neg Hx   . Esophageal cancer Neg Hx    Past Surgical History:  Procedure Laterality Date  . EYE SURGERY Bilateral 2017, 2018   release of optic pressure   . FUNCTIONAL ENDOSCOPIC SINUS SURGERY Bilateral 05/2015  . LIPOMA EXCISION Right 02/24/2023   Procedure: EXCISION LIPOMA RIGHT AXILLA;  Surgeon: Caralyn Chandler, MD;  Location: [facility];  Service: General;  Laterality: Right;  . Orbital Decompression Bilateral 05/2015   Social History   Social History Narrative   Original from [employer]    Lives w/ husband   Immunization History  Administered Date(s) Administered  . Influenza,inj,Quad PF,6+ Mos 12/24/2018  . Influenza-Unspecified 12/19/2019, 12/10/2021, 01/05/2023  . PFIZER(Purple Top)SARS-COV-2 Vaccination 07/04/2019, 07/25/2019, 03/10/2020  . Tdap 07/30/2012, 01/20/2023  . Zoster Recombinant(Shingrix) 09/01/2021     Objective: Vital Signs: LMP 05/16/2016    Physical Exam   Musculoskeletal Exam: ***  CDAI Exam: CDAI Score: -- Patient Global: --; Provider Global: -- Swollen: --; Tender: -- Joint Exam 10/11/2023   No joint exam has been documented for this visit   There is currently no information documented on the homunculus. Go to the Rheumatology activity and complete the homunculus joint exam.  Investigation: No additional findings.  Imaging: No results found.  Recent Labs: Lab Results  Component Value Date   WBC 6.5 05/01/2023  HGB 13.2 05/01/2023   PLT 248 05/01/2023   NA 140 05/01/2023   K 3.8 05/01/2023   CL 105 05/01/2023   CO2 28 05/01/2023   GLUCOSE 74 05/01/2023   BUN 13 05/01/2023   CREATININE 0.65 05/01/2023   BILITOT 0.6 05/01/2023   ALKPHOS 139 (H) 01/24/2023   AST 24 05/01/2023   ALT 23 05/01/2023   PROT 7.6 05/01/2023   PROT 7.6 05/01/2023   ALBUMIN 4.5 01/24/2023   CALCIUM 9.5 05/01/2023   GFRAA 117 09/16/2020    Speciality  Comments: PLQ Eye Exam: 06/13/2023 WNL@ [facility] Follow up in 1 year  Procedures:  No procedures performed Allergies: Patient has no known allergies.   Assessment / Plan:     Visit Diagnoses: Sjogren's syndrome with keratoconjunctivitis sicca (HCC)  High risk medication use  Raynaud's disease without gangrene  Osteopenia of multiple sites  History of vitamin D  deficiency  Chronic right shoulder pain  Primary insomnia  History of Graves' disease  Elevated LFTs  Orders: No orders of the defined types were placed in this encounter.  No orders of the defined types were placed in this encounter.   Face-to-face time spent with patient was *** minutes. Greater than 50% of time was spent in counseling and coordination of care.  Follow-Up Instructions: No follow-ups on file.   Romayne Clubs, PA-C  Note - This record has been created using Dragon software.  Chart creation errors have been sought, but may not always  have been located. Such creation errors do not reflect on  the standard of medical care.

## 2023-10-11 ENCOUNTER — Ambulatory Visit: Payer: Managed Care, Other (non HMO) | Admitting: Rheumatology

## 2023-10-11 DIAGNOSIS — G8929 Other chronic pain: Secondary | ICD-10-CM

## 2023-10-11 DIAGNOSIS — Z8639 Personal history of other endocrine, nutritional and metabolic disease: Secondary | ICD-10-CM

## 2023-10-11 DIAGNOSIS — F5101 Primary insomnia: Secondary | ICD-10-CM

## 2023-10-11 DIAGNOSIS — R7989 Other specified abnormal findings of blood chemistry: Secondary | ICD-10-CM

## 2023-10-11 DIAGNOSIS — I7300 Raynaud's syndrome without gangrene: Secondary | ICD-10-CM

## 2023-10-11 DIAGNOSIS — M8589 Other specified disorders of bone density and structure, multiple sites: Secondary | ICD-10-CM

## 2023-10-11 DIAGNOSIS — Z79899 Other long term (current) drug therapy: Secondary | ICD-10-CM

## 2023-10-11 DIAGNOSIS — M3501 Sicca syndrome with keratoconjunctivitis: Secondary | ICD-10-CM

## 2023-10-17 NOTE — Progress Notes (Deleted)
 Office Visit Note  Patient: Cynthia Fox             Date of Birth: 03-31-1971           MRN: [id number]             PCP: Amon Aloysius BRAVO, MD Referring: Amon Aloysius BRAVO, MD Visit Date: 10/31/2023 Occupation: @GUAROCC @  Subjective:  No chief complaint on file.   History of Present Illness: Cynthia Fox is a 53 y.o. female ***     Activities of Daily Living:  Patient reports morning stiffness for *** {minute/hour:19697}.   Patient {ACTIONS;DENIES/REPORTS:21021675::Denies} nocturnal pain.  Difficulty dressing/grooming: {ACTIONS;DENIES/REPORTS:21021675::Denies} Difficulty climbing stairs: {ACTIONS;DENIES/REPORTS:21021675::Denies} Difficulty getting out of chair: {ACTIONS;DENIES/REPORTS:21021675::Denies} Difficulty using hands for taps, buttons, cutlery, and/or writing: {ACTIONS;DENIES/REPORTS:21021675::Denies}  No Rheumatology ROS completed.   PMFS History:  Patient Active Problem List   Diagnosis Date Noted   H. 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   Annual physical exam 10/24/2016   Menopause 10/13/2016   H/O seasonal allergies 03/23/2016   Vitamin D  deficiency 03/23/2016   High risk medication use 03/22/2016   Sjogren's syndrome (HCC) 06/25/2015   PCP NOTES >>>>>>>>>>>>>>>>>>>>>> 06/25/2015   Graves disease 06/01/2015   Hyperprolactinemia (HCC) 01/12/2015   Elevated antinuclear antibody (ANA) level 07/23/2014    Past Medical History:  Diagnosis Date   Compressive optic neuropathy    Graves disease    Hepatitis A    Seasonal allergies    Sjogren's disease (HCC)    positive ANA nad Ro   Thyroid  disease    Thyroid  eye disease     Family History  Problem Relation Age of Onset   Hypertension Mother    Retinal detachment Mother    Thyroid  disease Mother    Diabetes Father    Thyroid  disease Sister    Other Sister        tonsil    Heart attack Maternal Grandfather        74   Breast cancer Neg Hx    Colon  cancer Neg Hx    Stomach cancer Neg Hx    Pancreatic cancer Neg Hx    Esophageal cancer Neg Hx    Past Surgical History:  Procedure Laterality Date   EYE SURGERY Bilateral 2017, 2018   release of optic pressure    FUNCTIONAL ENDOSCOPIC SINUS SURGERY Bilateral 05/2015   LIPOMA EXCISION Right 02/24/2023   Procedure: EXCISION LIPOMA RIGHT AXILLA;  Surgeon: Curvin Deward MOULD, MD;  Location: [facility];  Service: General;  Laterality: Right;   Orbital Decompression Bilateral 05/2015   Social History   Social History Narrative   Original from [place]    Lives w/ husband   Immunization History  Administered Date(s) Administered   Influenza,inj,Quad PF,6+ Mos 12/24/2018   Influenza-Unspecified 12/19/2019, 12/10/2021, 01/05/2023   PFIZER(Purple Top)SARS-COV-2 Vaccination 07/04/2019, 07/25/2019, 03/10/2020   Tdap 07/30/2012, 01/20/2023   Zoster Recombinant(Shingrix) 09/01/2021     Objective: Vital Signs: LMP 05/16/2016    Physical Exam   Musculoskeletal Exam: ***  CDAI Exam: CDAI Score: -- Patient Global: --; Provider Global: -- Swollen: --; Tender: -- Joint Exam 10/31/2023   No joint exam has been documented for this visit   There is currently no information documented on the homunculus. Go to the Rheumatology activity and complete the homunculus joint exam.  Investigation: No additional findings.  Imaging: No results found.  Recent Labs: Lab Results  Component Value Date   WBC 6.5 05/01/2023  HGB 13.2 05/01/2023   PLT 248 05/01/2023   NA 140 05/01/2023   K 3.8 05/01/2023   CL 105 05/01/2023   CO2 28 05/01/2023   GLUCOSE 74 05/01/2023   BUN 13 05/01/2023   CREATININE 0.65 05/01/2023   BILITOT 0.6 05/01/2023   ALKPHOS 139 (H) 01/24/2023   AST 24 05/01/2023   ALT 23 05/01/2023   PROT 7.6 05/01/2023   PROT 7.6 05/01/2023   ALBUMIN 4.5 01/24/2023   CALCIUM 9.5 05/01/2023   GFRAA 117 09/16/2020    Speciality Comments: PLQ Eye Exam: 06/13/2023 WNL@  [facility] Follow up in 1 year  Procedures:  No procedures performed Allergies: Patient has no known allergies.   Assessment / Plan:     Visit Diagnoses: Sjogren's syndrome with keratoconjunctivitis sicca (HCC)  High risk medication use  Raynaud's disease without gangrene  Osteopenia of multiple sites  History of vitamin D  deficiency  Primary insomnia  Chronic right shoulder pain  History of Graves' disease  Orders: No orders of the defined types were placed in this encounter.  No orders of the defined types were placed in this encounter.   Face-to-face time spent with patient was *** minutes. Greater than 50% of time was spent in counseling and coordination of care.  Follow-Up Instructions: No follow-ups on file.   Waddell CHRISTELLA Craze, PA-C  Note - This record has been created using Dragon software.  Chart creation errors have been sought, but may not always  have been located. Such creation errors do not reflect on  the standard of medical care.

## 2023-10-31 ENCOUNTER — Ambulatory Visit: Admitting: Physician Assistant

## 2023-10-31 DIAGNOSIS — Z79899 Other long term (current) drug therapy: Secondary | ICD-10-CM

## 2023-10-31 DIAGNOSIS — Z8639 Personal history of other endocrine, nutritional and metabolic disease: Secondary | ICD-10-CM

## 2023-10-31 DIAGNOSIS — M8589 Other specified disorders of bone density and structure, multiple sites: Secondary | ICD-10-CM

## 2023-10-31 DIAGNOSIS — M3501 Sicca syndrome with keratoconjunctivitis: Secondary | ICD-10-CM

## 2023-10-31 DIAGNOSIS — I7300 Raynaud's syndrome without gangrene: Secondary | ICD-10-CM

## 2023-10-31 DIAGNOSIS — F5101 Primary insomnia: Secondary | ICD-10-CM

## 2023-10-31 DIAGNOSIS — G8929 Other chronic pain: Secondary | ICD-10-CM

## 2023-11-14 NOTE — Progress Notes (Unsigned)
 Office Visit Note  Patient: Cynthia Fox             Date of Birth: Feb 11, 1971           MRN: [id number]             PCP: Amon Aloysius BRAVO, MD Referring: Amon Aloysius BRAVO, MD Visit Date: 11/28/2023 Occupation: @GUAROCC @  Subjective:  Right foot burning sensation   History of Present Illness: Cynthia Fox is a 53 y.o. female with history of sjogren's syndrome.  Patient remains on plaquenil  200 mg 1 tablet by mouth once daily.  She is tolerating Plaquenil  without any side effects and has not had any recent gaps in therapy.  She continues to have chronic sicca symptoms which have been manageable.  She continues to follow-up with the dentist every 6 months and ophthalmology on a yearly basis.  She had updated Pap and eye examination in March 2025.  Patient states that she has been increasing her exercise regimen.  She has started walking on her treadmill as well as doing some weight lifting.  Patient states that that she has increased her walking regimen she has noticed a burning sensation on the lateral aspect of her right foot.  Patient states that this sensation has been intermittent but tends to be worse at night.  She denies any burning sensation currently.  She denies any joint swelling.  She denies any joint pain in her foot at this time.  She denies any other joint pain or joint swelling at this time. Patient continues to have intermittent symptoms of Raynaud's phenomenon.  Activities of Daily Living:  Patient reports morning stiffness for 0 minute.   Patient Denies nocturnal pain.  Difficulty dressing/grooming: Denies Difficulty climbing stairs: Denies Difficulty getting out of chair: Denies Difficulty using hands for taps, buttons, cutlery, and/or writing: Denies  Review of Systems  Constitutional:  Negative for fatigue.  HENT:  Positive for mouth dryness. Negative for mouth sores.   Eyes:  Positive for dryness.  Respiratory:  Negative for shortness of breath.   Cardiovascular:   Negative for chest pain and palpitations.  Gastrointestinal:  Negative for blood in stool, constipation and diarrhea.  Endocrine: Negative for increased urination.  Genitourinary:  Negative for involuntary urination.  Musculoskeletal:  Positive for joint pain, joint pain, muscle weakness and muscle tenderness. Negative for gait problem, joint swelling, myalgias, morning stiffness and myalgias.  Skin:  Positive for color change. Negative for rash, hair loss and sensitivity to sunlight.  Allergic/Immunologic: Negative for susceptible to infections.  Neurological:  Negative for dizziness and headaches.  Hematological:  Negative for swollen glands.  Psychiatric/Behavioral:  Positive for sleep disturbance. Negative for depressed mood. The patient is not nervous/anxious.     PMFS History:  Patient Active Problem List   Diagnosis Date Noted   H. 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   Annual physical exam 10/24/2016   Menopause 10/13/2016   H/O seasonal allergies 03/23/2016   Vitamin D  deficiency 03/23/2016   High risk medication use 03/22/2016   Sjogren's syndrome (HCC) 06/25/2015   PCP NOTES >>>>>>>>>>>>>>>>>>>>>> 06/25/2015   Graves disease 06/01/2015   Hyperprolactinemia (HCC) 01/12/2015   Elevated antinuclear antibody (ANA) level 07/23/2014    Past Medical History:  Diagnosis Date   Compressive optic neuropathy    Graves disease    Hepatitis A    Seasonal allergies    Sjogren's disease (HCC)    positive ANA nad Ro  Thyroid  disease    Thyroid  eye disease     Family History  Problem Relation Age of Onset   Hypertension Mother    Retinal detachment Mother    Thyroid  disease Mother    Diabetes Father    Thyroid  disease Sister    Other Sister        tonsil    Heart attack Maternal Grandfather        63   Breast cancer Neg Hx    Colon cancer Neg Hx    Stomach cancer Neg Hx    Pancreatic cancer Neg Hx    Esophageal cancer Neg Hx     Past Surgical History:  Procedure Laterality Date   EYE SURGERY Bilateral 2017, 2018   release of optic pressure    FUNCTIONAL ENDOSCOPIC SINUS SURGERY Bilateral 05/2015   LIPOMA EXCISION Right 02/24/2023   Procedure: EXCISION LIPOMA RIGHT AXILLA;  Surgeon: Curvin Deward MOULD, MD;  Location: [facility];  Service: General;  Laterality: Right;   Orbital Decompression Bilateral 05/2015   Social History   Social History Narrative   Original from [place]    Lives w/ husband   Immunization History  Administered Date(s) Administered   Influenza,inj,Quad PF,6+ Mos 12/24/2018   Influenza-Unspecified 12/19/2019, 12/10/2021, 01/05/2023   PFIZER(Purple Top)SARS-COV-2 Vaccination 07/04/2019, 07/25/2019, 03/10/2020   Tdap 07/30/2012, 01/20/2023   Zoster Recombinant(Shingrix) 09/01/2021     Objective: Vital Signs: BP 109/75 (BP Location: Left Arm, Patient Position: Sitting, Cuff Size: Normal)   Pulse 86   Resp 16   Ht 5' (1.524 m)   Wt 159 lb (72.1 kg)   LMP 05/16/2016   BMI 31.05 kg/m    Physical Exam Vitals and nursing note reviewed.  Constitutional:      Appearance: She is well-developed.  HENT:     Head: Normocephalic and atraumatic.  Eyes:     Conjunctiva/sclera: Conjunctivae normal.  Cardiovascular:     Rate and Rhythm: Normal rate and regular rhythm.     Heart sounds: Normal heart sounds.  Pulmonary:     Effort: Pulmonary effort is normal.     Breath sounds: Normal breath sounds.  Abdominal:     General: Bowel sounds are normal.     Palpations: Abdomen is soft.  Musculoskeletal:     Cervical back: Normal range of motion.  Skin:    General: Skin is warm and dry.     Capillary Refill: Capillary refill takes less than 2 seconds.     Comments: Generalized puffiness of both hands noted.  No signs of sclerodactyly.  No digital ulcerations. Telangectasia's on the palmar aspect of both hands and inner lower lip.  Neurological:     Mental Status: She is alert and  oriented to person, place, and time.  Psychiatric:        Behavior: Behavior normal.      Musculoskeletal Exam: C-spine, thoracic spine, lumbar spine good range of motion.  Shoulder joints, elbow joints, wrist joints, MCPs, PIPs, DIPs have good range of motion with no synovitis.  Complete fist formation bilaterally.  Hip joints have good range of motion with no groin pain.  Knee joints have good range of motion no warmth or effusion.  Ankle joints have good range of motion no tenderness or joint swelling.  No tenderness over the base of the fifth metatarsal.  No tenderness or synovitis over MTP joints.  No evidence of Achilles tendinitis or plantar fasciitis.  CDAI Exam: CDAI Score: -- Patient Global: --; Provider  Global: -- Swollen: --; Tender: -- Joint Exam 11/28/2023   No joint exam has been documented for this visit   There is currently no information documented on the homunculus. Go to the Rheumatology activity and complete the homunculus joint exam.  Investigation: No additional findings.  Imaging: No results found.  Recent Labs: Lab Results  Component Value Date   WBC 6.5 05/01/2023   HGB 13.2 05/01/2023   PLT 248 05/01/2023   NA 140 05/01/2023   K 3.8 05/01/2023   CL 105 05/01/2023   CO2 28 05/01/2023   GLUCOSE 74 05/01/2023   BUN 13 05/01/2023   CREATININE 0.65 05/01/2023   BILITOT 0.6 05/01/2023   ALKPHOS 139 (H) 01/24/2023   AST 24 05/01/2023   ALT 23 05/01/2023   PROT 7.6 05/01/2023   PROT 7.6 05/01/2023   ALBUMIN 4.5 01/24/2023   CALCIUM 9.5 05/01/2023   GFRAA 117 09/16/2020    Speciality Comments: PLQ Eye Exam: 06/13/2023 WNL@ [facility] Follow up in 1 year  Procedures:  No procedures performed Allergies: Patient has no known allergies.   Assessment / Plan:     Visit Diagnoses: Sjogren's syndrome with keratoconjunctivitis sicca (HCC) - Positive ANA, positive Ro, positive rheumatoid factor, negative CCP: She continues to have chronic sicca  symptoms.  She has been using Restasis for dry eyes and pilocarpine  5 mg twice daily for symptoms of dry mouth.  She remains on Plaquenil  200 mg 1 tablet by mouth daily.  She is tolerating Plaquenil  without any side effects and has not had any recent gaps in therapy.  She has no synovitis on examination today.   Discussed the importance of seeing the dentist at least every 6 months and ophthalmology at least on a yearly basis.  Discussed increased risk for developing interstitial lung disease in patients with Sjogren syndrome.  Her lungs are clear to auscultation today.  She has not developed any new or worsening pulmonary symptoms.  Discussed the increased risk for developing lymphoma in patients with Sjogren syndrome.  IFE was normal on 05/01/2023.   Lab work from 05/01/2023 was reviewed today in the office: Ro antibody remains positive, La antibody negative, RF negative, ESR within normal limits, complements within normal limits, CMP within normal limits, UA normal, IFE normal.  Plan to update the following lab work today for further evaluation. Patient will remain on Plaquenil  as prescribed.  She was advised to notify us  if she develops any new or worsening symptoms.  She will follow-up in the office in 5 months or sooner if needed. - Plan: CBC with Differential/Platelet, Urinalysis, Routine w reflex microscopic, Comprehensive metabolic panel with GFR, C3 and C4, Sedimentation rate, ANA, Sjogrens syndrome-A extractable nuclear antibody, Rheumatoid factor, Serum protein electrophoresis with reflex, hydroxychloroquine  (PLAQUENIL ) 200 MG tablet, Systemic Sclerosis (Scleroderma) 12 Antibodies Panel 2, CK  High risk medication use - Plaquenil  200 mg 1 tablet by mouth daily.   CBC and CMP updated on 05/01/23.  Orders for CBC and CMP released today.  PLQ Eye Exam: 06/13/2023 WNL@ [facility] Follow up in 1 year   - Plan: CBC with Differential/Platelet, Comprehensive metabolic panel with GFR  Raynaud's disease  without gangrene -She has intermittent symptoms of Raynaud's phenomenon.telangiectasia's noted on the palmar aspect of both hands.  Generalized puffiness of both hands noted.  No sclerodactyly noted.  No digital ulcerations noted.  Plan to obtain the following lab work today for further evaluation.  Plan: Systemic Sclerosis (Scleroderma) 12 Antibodies Panel 2, CK  Telangiectasias -Palmar aspect of both hips and inner lower lip.  Plan to obtain the following lab work today for further evaluation. Plan: Systemic Sclerosis (Scleroderma) 12 Antibodies Panel 2, CK  Osteopenia of multiple sites - - DEXA updated on 01/20/22:  The BMD measured at Femur Neck Left is 0.825 g/cm2 with a T-score of -1.5.  Patient is taking a vitamin D  and calcium supplement.  Due to update DEXA October 2025.  Burning sensation of foot: Lateral aspect of right foot--intermittent.  Not currently symptomatic.  No tenderness over the base of the right fifth metatarsal.  Ankle joint has good range of motion with no tenderness or synovitis.  No tenderness or synovitis over MTP joints.  Patient was advised to notify us  if her symptoms return.  Discussed importance of wearing proper fitting shoes   History of vitamin D  deficiency: She is taking vitamin D  supplement twice a week.  Chronic right shoulder pain - Resolved. Lipoma from right axilla excised November 2024.  Other medical conditions are listed as follows:   Primary insomnia  History of Graves' disease  Elevated LFTs    Orders: Orders Placed This Encounter  Procedures   CBC with Differential/Platelet   Urinalysis, Routine w reflex microscopic   Comprehensive metabolic panel with GFR   C3 and C4   Sedimentation rate   ANA   Sjogrens syndrome-A extractable nuclear antibody   Rheumatoid factor   Serum protein electrophoresis with reflex   Systemic Sclerosis (Scleroderma) 12 Antibodies Panel 2   CK   Meds ordered this encounter  Medications    hydroxychloroquine  (PLAQUENIL ) 200 MG tablet    Sig: TAKE 1 TABLET(200 MG) BY MOUTH DAILY    Dispense:  90 tablet    Refill:  0     Follow-Up Instructions: Return in about 5 months (around 04/29/2024) for Sjogren's syndrome.   Waddell CHRISTELLA Craze, PA-C  Note - This record has been created using Dragon software.  Chart creation errors have been sought, but may not always  have been located. Such creation errors do not reflect on  the standard of medical care.

## 2023-11-28 ENCOUNTER — Encounter: Payer: Self-pay | Admitting: Physician Assistant

## 2023-11-28 ENCOUNTER — Ambulatory Visit: Attending: Physician Assistant | Admitting: Physician Assistant

## 2023-11-28 VITALS — BP 109/75 | HR 86 | Resp 16 | Ht 60.0 in | Wt 159.0 lb

## 2023-11-28 DIAGNOSIS — M3501 Sicca syndrome with keratoconjunctivitis: Secondary | ICD-10-CM

## 2023-11-28 DIAGNOSIS — Z79899 Other long term (current) drug therapy: Secondary | ICD-10-CM

## 2023-11-28 DIAGNOSIS — M25511 Pain in right shoulder: Secondary | ICD-10-CM

## 2023-11-28 DIAGNOSIS — R208 Other disturbances of skin sensation: Secondary | ICD-10-CM

## 2023-11-28 DIAGNOSIS — F5101 Primary insomnia: Secondary | ICD-10-CM

## 2023-11-28 DIAGNOSIS — M8589 Other specified disorders of bone density and structure, multiple sites: Secondary | ICD-10-CM

## 2023-11-28 DIAGNOSIS — Z8639 Personal history of other endocrine, nutritional and metabolic disease: Secondary | ICD-10-CM

## 2023-11-28 DIAGNOSIS — I781 Nevus, non-neoplastic: Secondary | ICD-10-CM

## 2023-11-28 DIAGNOSIS — I7300 Raynaud's syndrome without gangrene: Secondary | ICD-10-CM

## 2023-11-28 DIAGNOSIS — G8929 Other chronic pain: Secondary | ICD-10-CM

## 2023-11-28 DIAGNOSIS — R7989 Other specified abnormal findings of blood chemistry: Secondary | ICD-10-CM

## 2023-11-28 MED ORDER — HYDROXYCHLOROQUINE SULFATE 200 MG PO TABS: ORAL_TABLET | ORAL | 0 refills | Status: AC

## 2023-11-29 ENCOUNTER — Ambulatory Visit: Payer: Self-pay | Admitting: Physician Assistant

## 2023-11-29 NOTE — Progress Notes (Signed)
 CBC stable.  Alk phos is elevated-155. AST and ALT are elevated.  Please clarify if she has been taking any tylenol ? Alcohol use? Any other changes in medication? Rest of CMP WNL  ESR WNL CK WNL UA normal

## 2023-12-04 LAB — SYSTEMIC SCLEROSIS (SCLERODERMA) 12 ANTIBODIES PANEL 2
Centromere protein A Ab: 165 SI — ABNORMAL HIGH (ref <11)
Centromere protein B Ab: 138 SI — ABNORMAL HIGH (ref <11)
Fibrillarin Ab: <11 SI (ref <11)
PM-SCL-100 Ab: <11 SI (ref <11)
PM-SCL-75 Ab: <11 SI (ref <11)
RNA polymerase III RP11 Ab: <11 SI (ref <11)
RNA polymerase III RP155 Ab: <11 SI (ref <11)
SCL-70 extractable nuclear Ab: <11 SI (ref <11)
Th-To Ab: <11 SI (ref <11)
U1 small nuclear ribonucleoprotein 70kD Ab: <11 SI (ref <11)
U1 small nuclear ribonucleoprotein A Ab: <11 SI (ref <11)
U1 small nuclear ribonucleoprotein C Ab: <11 SI (ref <11)

## 2023-12-04 LAB — CBC WITH DIFFERENTIAL/PLATELET
Absolute Lymphocytes: 2088 {cells}/uL (ref 850–3900)
Absolute Monocytes: 518 {cells}/uL (ref 200–950)
Basophils Absolute: 72 {cells}/uL (ref 0–200)
Basophils Relative: 1 %
Eosinophils Absolute: 468 {cells}/uL (ref 15–500)
Eosinophils Relative: 6.5 %
HCT: 41.8 % (ref 35.0–45.0)
Hemoglobin: 13.5 g/dL (ref 11.7–15.5)
MCH: 32.8 pg (ref 27.0–33.0)
MCHC: 32.3 g/dL (ref 32.0–36.0)
MCV: 101.7 fL — ABNORMAL HIGH (ref 80.0–100.0)
MPV: 12.4 fL (ref 7.5–12.5)
Monocytes Relative: 7.2 %
Neutro Abs: 4054 {cells}/uL (ref 1500–7800)
Neutrophils Relative %: 56.3 %
Platelets: 274 Thousand/uL (ref 140–400)
RBC: 4.11 Million/uL (ref 3.80–5.10)
RDW: 13 % (ref 11.0–15.0)
Total Lymphocyte: 29 %
WBC: 7.2 Thousand/uL (ref 3.8–10.8)

## 2023-12-04 LAB — COMPREHENSIVE METABOLIC PANEL WITH GFR
AG Ratio: 1.6 (calc) (ref 1.0–2.5)
ALT: 48 U/L — ABNORMAL HIGH (ref 6–29)
AST: 37 U/L — ABNORMAL HIGH (ref 10–35)
Albumin: 4.7 g/dL (ref 3.6–5.1)
Alkaline phosphatase (APISO): 155 U/L — ABNORMAL HIGH (ref 37–153)
BUN: 17 mg/dL (ref 7–25)
CO2: 26 mmol/L (ref 20–32)
Calcium: 9.6 mg/dL (ref 8.6–10.4)
Chloride: 101 mmol/L (ref 98–110)
Creat: 1.03 mg/dL (ref 0.50–1.03)
Globulin: 2.9 g/dL (ref 1.9–3.7)
Glucose, Bld: 99 mg/dL (ref 65–99)
Potassium: 4 mmol/L (ref 3.5–5.3)
Sodium: 139 mmol/L (ref 135–146)
Total Bilirubin: 0.8 mg/dL (ref 0.2–1.2)
Total Protein: 7.6 g/dL (ref 6.1–8.1)
eGFR: 64 mL/min/1.73m2 (ref >=60)

## 2023-12-04 LAB — ANTI-NUCLEAR AB-TITER (ANA TITER)
ANA TITER: 1:1280 {titer} — ABNORMAL HIGH
ANA Titer 1: 1:40 {titer} — ABNORMAL HIGH

## 2023-12-04 LAB — ANA: Anti Nuclear Antibody (ANA): POSITIVE — AB

## 2023-12-04 LAB — C3 AND C4
C3 Complement: 141 mg/dL (ref 83–193)
C4 Complement: 27 mg/dL (ref 15–57)

## 2023-12-04 LAB — URINALYSIS, ROUTINE W REFLEX MICROSCOPIC
Bilirubin Urine: NEGATIVE
Glucose, UA: NEGATIVE
Hgb urine dipstick: NEGATIVE
Ketones, ur: NEGATIVE
Leukocytes,Ua: NEGATIVE
Nitrite: NEGATIVE
Protein, ur: NEGATIVE
Specific Gravity, Urine: 1.018 (ref 1.001–1.035)
pH: 6.5 (ref 5.0–8.0)

## 2023-12-04 LAB — PROTEIN ELECTROPHORESIS, SERUM, WITH REFLEX
Albumin ELP: 4.5 g/dL (ref 3.8–4.8)
Alpha 1: 0.2 g/dL (ref 0.2–0.3)
Alpha 2: 0.6 g/dL (ref 0.5–0.9)
Beta 2: 0.6 g/dL — ABNORMAL HIGH (ref 0.2–0.5)
Beta Globulin: 0.6 g/dL (ref 0.4–0.6)
Gamma Globulin: 1.2 g/dL (ref 0.8–1.7)
Total Protein: 7.8 g/dL (ref 6.1–8.1)

## 2023-12-04 LAB — IFE INTERPRETATION

## 2023-12-04 LAB — SJOGRENS SYNDROME-A EXTRACTABLE NUCLEAR ANTIBODY: SSA (Ro) (ENA) Antibody, IgG: >8 AI — AB

## 2023-12-04 LAB — CK: Total CK: 124 U/L (ref 21–240)

## 2023-12-04 LAB — RHEUMATOID FACTOR: Rheumatoid fact SerPl-aCnc: 13 [IU]/mL (ref <14)

## 2023-12-04 LAB — SEDIMENTATION RATE: Sed Rate: 28 mm/h (ref 0–30)

## 2023-12-04 NOTE — Progress Notes (Signed)
 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   She has been on plaquenil  for many years for management of sjogren's--we both noticed new clinical features on exam last week prompting me to check systemic sclerosis panel and CK.   Should we bring the patient back to discuss other treatment options--cellcept or imuran? Or watch and wait for now?

## 2023-12-05 NOTE — Progress Notes (Signed)
 Please schedule a follow-up appointment to discuss the diagnosis of scleroderma.  She will need high-resolution CT and echocardiogram to evaluate further.

## 2023-12-06 ENCOUNTER — Ambulatory Visit: Attending: Physician Assistant | Admitting: Physician Assistant

## 2023-12-06 ENCOUNTER — Encounter: Payer: Self-pay | Admitting: Physician Assistant

## 2023-12-06 VITALS — BP 115/79 | HR 89 | Resp 14 | Ht 60.0 in | Wt 160.4 lb

## 2023-12-06 DIAGNOSIS — M8589 Other specified disorders of bone density and structure, multiple sites: Secondary | ICD-10-CM

## 2023-12-06 DIAGNOSIS — I7300 Raynaud's syndrome without gangrene: Secondary | ICD-10-CM

## 2023-12-06 DIAGNOSIS — R7989 Other specified abnormal findings of blood chemistry: Secondary | ICD-10-CM

## 2023-12-06 DIAGNOSIS — Z79899 Other long term (current) drug therapy: Secondary | ICD-10-CM | POA: Diagnosis not present

## 2023-12-06 DIAGNOSIS — G8929 Other chronic pain: Secondary | ICD-10-CM

## 2023-12-06 DIAGNOSIS — M25511 Pain in right shoulder: Secondary | ICD-10-CM

## 2023-12-06 DIAGNOSIS — M3501 Sicca syndrome with keratoconjunctivitis: Secondary | ICD-10-CM | POA: Diagnosis not present

## 2023-12-06 DIAGNOSIS — M349 Systemic sclerosis, unspecified: Secondary | ICD-10-CM | POA: Diagnosis not present

## 2023-12-06 DIAGNOSIS — I781 Nevus, non-neoplastic: Secondary | ICD-10-CM

## 2023-12-06 DIAGNOSIS — Z8639 Personal history of other endocrine, nutritional and metabolic disease: Secondary | ICD-10-CM

## 2023-12-06 DIAGNOSIS — M25512 Pain in left shoulder: Secondary | ICD-10-CM

## 2023-12-06 DIAGNOSIS — F5101 Primary insomnia: Secondary | ICD-10-CM

## 2023-12-06 NOTE — Progress Notes (Signed)
 Office Visit Note  Patient: Cynthia Fox             Date of Birth: 1970-06-28           MRN: [id number]             PCP: Amon Aloysius BRAVO, MD Referring: Amon Aloysius BRAVO, MD Visit Date: 12/06/2023 Occupation: @GUAROCC @  Subjective:  Discuss results   History of Present Illness: Cynthia Fox is a 53 y.o. female with history of sjogren's syndrome.  She remains on Plaquenil  200 mg 1 tablet by mouth daily.  She continues to tolerate Plaquenil  without any side effects.  She denies any recent gaps in therapy.  She was last seen in the office on 11/28/2023 at which time she had updated lab work.  She presents today to discuss lab results and the next steps in management.   She denies any new concerns since her last office visit.  She continues to have chronic sicca symptoms and has been taking pilocarpine  as needed and using Restasis daily for symptomatic relief.  She continues to have intermittent symptoms of raynaud's phenomenon.      Component     Latest Ref Rng 11/28/2023  SCL-70 extractable nuclear Ab     <11 SI <11   Centromere protein A Ab     <11 SI 165 (H)   Centromere protein B Ab     <11 SI 138 (H)   RNA polymerase III RP11 Ab     <11 SI <11   RNA polymerase III RP155 Ab     <11 SI <11   U1 small nuclear ribonucleoprotein A Ab     <11 SI <11   U1 small nuclear ribonucleoprotein C Ab     <11 SI <11   U1 small nuclear ribonucleoprotein 70kD Ab     <11 SI <11   Fibrillarin Ab     <11 SI <11   Th-To Ab     <11 SI <11   PM-SCL-100 Ab     <11 SI <11   PM-SCL-75 Ab     <11 SI <11   Total Protein     6.1 - 8.1 g/dL 7.8   Albumin ELP     3.8 - 4.8 g/dL 4.5   Alpha 1     0.2 - 0.3 g/dL 0.2   Alpha 2     0.5 - 0.9 g/dL 0.6   Beta Globulin     0.4 - 0.6 g/dL 0.6   Beta 2     0.2 - 0.5 g/dL 0.6 (H)   Gamma Globulin     0.8 - 1.7 g/dL 1.2   SPE Interp. -   ANA Titer 1     titer 1:40 (H)   ANA Pattern 1 Nuclear, Speckled !   ANA TITER     titer 1:1,280 (H)   ANA  PATTERN Nuclear, Centromere !   C3 Complement     83 - 193 mg/dL 858   C4 Complement     15 - 57 mg/dL 27   Sed Rate     0 - 30 mm/h 28   Anti Nuclear Antibody (ANA)     NEGATIVE  POSITIVE !   SSA (Ro) (ENA) Antibody, IgG     <1.0 NEG AI >8.0 POS !   RA Latex Turbid.     <14 IU/mL 13   CK Total     21 - 240 U/L 124   Immunofix Electr  Int -     Legend: (H) High ! Abnormal Activities of Daily Living:  Patient reports morning stiffness for 0 minute.   Patient Denies nocturnal pain.  Difficulty dressing/grooming: Denies Difficulty climbing stairs: Denies Difficulty getting out of chair: Denies Difficulty using hands for taps, buttons, cutlery, and/or writing: Denies  Review of Systems  Constitutional:  Negative for fatigue.  HENT:  Positive for mouth dryness. Negative for mouth sores.   Eyes:  Positive for dryness.  Respiratory:  Negative for shortness of breath.   Cardiovascular:  Negative for chest pain and palpitations.  Gastrointestinal:  Negative for blood in stool, constipation and diarrhea.  Endocrine: Negative for increased urination.  Genitourinary:  Negative for involuntary urination.  Musculoskeletal:  Positive for muscle tenderness. Negative for joint pain, gait problem, joint pain, joint swelling, myalgias, muscle weakness, morning stiffness and myalgias.  Skin:  Positive for color change. Negative for rash, hair loss and sensitivity to sunlight.  Allergic/Immunologic: Negative for susceptible to infections.  Neurological:  Negative for dizziness and headaches.  Hematological:  Negative for swollen glands.  Psychiatric/Behavioral:  Positive for sleep disturbance. Negative for depressed mood. The patient is not nervous/anxious.     PMFS History:  Patient Active Problem List   Diagnosis Date Noted   H. 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   Annual physical exam 10/24/2016   Menopause 10/13/2016   H/O seasonal  allergies 03/23/2016   Vitamin D  deficiency 03/23/2016   High risk medication use 03/22/2016   Sjogren's syndrome (HCC) 06/25/2015   PCP NOTES >>>>>>>>>>>>>>>>>>>>>> 06/25/2015   Graves disease 06/01/2015   Hyperprolactinemia (HCC) 01/12/2015   Elevated antinuclear antibody (ANA) level 07/23/2014    Past Medical History:  Diagnosis Date   Compressive optic neuropathy    Graves disease    Hepatitis A    Seasonal allergies    Sjogren's disease (HCC)    positive ANA nad Ro   Thyroid  disease    Thyroid  eye disease     Family History  Problem Relation Age of Onset   Hypertension Mother    Retinal detachment Mother    Thyroid  disease Mother    Diabetes Father    Thyroid  disease Sister    Other Sister        tonsil    Heart attack Maternal Grandfather        17   Breast cancer Neg Hx    Colon cancer Neg Hx    Stomach cancer Neg Hx    Pancreatic cancer Neg Hx    Esophageal cancer Neg Hx    Past Surgical History:  Procedure Laterality Date   EYE SURGERY Bilateral 2017, 2018   release of optic pressure    FUNCTIONAL ENDOSCOPIC SINUS SURGERY Bilateral 05/2015   LIPOMA EXCISION Right 02/24/2023   Procedure: EXCISION LIPOMA RIGHT AXILLA;  Surgeon: Curvin Deward MOULD, MD;  Location: [facility];  Service: General;  Laterality: Right;   Orbital Decompression Bilateral 05/2015   Social History   Social History Narrative   Original from [place]    Lives w/ husband   Immunization History  Administered Date(s) Administered   Influenza,inj,Quad PF,6+ Mos 12/24/2018   Influenza-Unspecified 12/19/2019, 12/10/2021, 01/05/2023   PFIZER(Purple Top)SARS-COV-2 Vaccination 07/04/2019, 07/25/2019, 03/10/2020   Tdap 07/30/2012, 01/20/2023   Zoster Recombinant(Shingrix) 09/01/2021     Objective: Vital Signs: BP 115/79 (BP Location: Left Arm, Patient Position: Sitting, Cuff Size: Normal)   Pulse 89   Resp 14   Ht  5' (1.524 m)   Wt 160 lb 6.4 oz (72.8 kg)   LMP 05/16/2016    BMI 31.33 kg/m    Physical Exam Vitals and nursing note reviewed.  Constitutional:      Appearance: She is well-developed.  HENT:     Head: Normocephalic and atraumatic.  Eyes:     Conjunctiva/sclera: Conjunctivae normal.  Cardiovascular:     Rate and Rhythm: Normal rate and regular rhythm.     Heart sounds: Normal heart sounds.  Pulmonary:     Effort: Pulmonary effort is normal.     Breath sounds: Normal breath sounds.  Abdominal:     General: Bowel sounds are normal.     Palpations: Abdomen is soft.  Musculoskeletal:     Cervical back: Normal range of motion.  Skin:    General: Skin is warm and dry.     Capillary Refill: Capillary refill takes less than 2 seconds.     Comments: Generalized puffiness of both hands noted.  No signs of sclerodactyly.  No digital ulcerations. Telangectasia's on the palmar aspect of both hands and inner lower lip.  Neurological:     Mental Status: She is alert and oriented to person, place, and time.  Psychiatric:        Behavior: Behavior normal.      Musculoskeletal Exam: C-spine, thoracic spine, lumbar spine good range of motion.  Shoulder joints, elbow joints, wrist joints, MCPs, PIPs, DIPs have good range of motion with no synovitis.  Complete fist formation bilaterally.  Hip joints have good range of motion with no groin pain.  Knee joints have good range of motion no warmth or effusion.  Ankle joints have good range of motion with no tenderness or joint swelling.  CDAI Exam: CDAI Score: -- Patient Global: --; Provider Global: -- Swollen: --; Tender: -- Joint Exam 12/06/2023   No joint exam has been documented for this visit   There is currently no information documented on the homunculus. Go to the Rheumatology activity and complete the homunculus joint exam.  Investigation: No additional findings.  Imaging: No results found.  Recent Labs: Lab Results  Component Value Date   WBC 7.2 11/28/2023   HGB 13.5 11/28/2023   PLT  274 11/28/2023   NA 139 11/28/2023   K 4.0 11/28/2023   CL 101 11/28/2023   CO2 26 11/28/2023   GLUCOSE 99 11/28/2023   BUN 17 11/28/2023   CREATININE 1.03 11/28/2023   BILITOT 0.8 11/28/2023   ALKPHOS 139 (H) 01/24/2023   AST 37 (H) 11/28/2023   ALT 48 (H) 11/28/2023   PROT 7.6 11/28/2023   PROT 7.8 11/28/2023   ALBUMIN 4.5 01/24/2023   CALCIUM 9.6 11/28/2023   GFRAA 117 09/16/2020    Speciality Comments: PLQ Eye Exam: 06/13/2023 WNL@ [facility] Follow up in 1 year  Procedures:  No procedures performed Allergies: Patient has no known allergies.   Assessment / Plan:     Visit Diagnoses: Systemic sclerosis (HCC) -Lab work from 11/28/2023 was reviewed today in the office: ANA 1: 40 NS, 1: 1280 nuclear, centromere, complements within normal limits, ESR 28, Ro antibody remains positive, RF 13, CK 124, SCL 70 negative, centromere protein a antibody positive, centromere protein B antibody positive, RNA polymerase III negative, RNP negative.  All questions were addressed.   Discussed the concern for systemic sclerosis--clinical features were discussed including signs and symptoms to monitor for. She has  longstanding symptoms of Raynaud's phenomenon but has had new onset  skin tightness and puffiness of both hands.  No signs of sclerodactyly were noted at this time.  No digital ulcerations were noted. Telangiectasias noted on lips and palms.   Her lungs were clear to auscultation.  Discussed the concern for developing interstitial lung disease in patients with systemic sclerosis.  Patient will require pulmonary function testing and a high-resolution chest CT.  A referral to the ILD clinic at [facility] pulmonary was placed today to establish care for screening.  A referral to cardiology was also placed to rule out pulmonary hypertension. Discussed the importance of close blood pressure monitoring.  Discussed the concern for renal crisis.  Patient was encouraged to purchase a blood pressure cuff  and to check BP daily at home.  The patient does not have any symptoms of dysphagia, reflux, or constipation. No signs of inflammatory arthritis were noted at this time. She will remain on Plaquenil  200 mg 1 tablet by mouth daily for now for management of Sjogren syndrome.  Discussed the possible option of CellCept or Imuran in the future if needed.  As of now she does not require immunosuppressive therapy.  Plan to follow-up once she has completed the workup with cardiology and pulmonology.  She will notify us  if she develops any new or worsening symptoms between now and then.  Plan: Ambulatory referral to Pulmonology, Ambulatory referral to Cardiology  Telangiectasias: Palmar aspect of both hips and inner lower lip.   Sjogren's syndrome with keratoconjunctivitis sicca (HCC): - Positive ANA, positive Ro, positive rheumatoid factor, negative CCP: Patient continues to have chronic sicca symptoms and has been taking pilocarpine  as needed and using Restasis on a daily basis for symptomatic relief.  She continues to take Plaquenil  200 mg 1 tablet by mouth daily.  No signs of inflammatory arthritis were noted on examination today.  She had updated lab work drawn at her last office visit on 11/28/2023 at which time the ANA and Ro antibody have remained positive.  High risk medication use: Plaquenil  200 mg 1 tablet by mouth daily.   CBC and CMP updated on 11/28/2023 PLQ Eye Exam: 06/13/2023 [facility] Follow up in 1 year   Raynaud's disease without gangrene: She continues to have intermittent symptoms of Raynaud's phenomenon.  No signs of sclerodactyly were noted.  No digital ulcerations were noted.  She was advised to notify us  if she develops any new or worsening symptoms. Discussed current conservative treatment options for symptoms of Raynaud's phenomenon today in detail.  All questions were addressed.  Osteopenia of multiple sites: DEXA updated on 01/20/22:  The BMD measured at Femur Neck Left is  0.825 g/cm2 with a T-score of -1.5.  Patient is taking a vitamin D  and calcium supplement.  Due to update DEXA October 2025.  History of vitamin D  deficiency:  She is taking vitamin D  supplement twice a week.   Other medical conditions are listed as follows:   Primary insomnia  History of Graves' disease  Elevated LFTs  Chronic pain of both shoulders  Orders: Orders Placed This Encounter  Procedures   Ambulatory referral to Pulmonology   Ambulatory referral to Cardiology   No orders of the defined types were placed in this encounter.   Follow-Up Instructions: No follow-ups on file.   Waddell CHRISTELLA Craze, PA-C  Note - This record has been created using Dragon software.  Chart creation errors have been sought, but may not always  have been located. Such creation errors do not reflect on  the standard  of medical care.

## 2023-12-12 ENCOUNTER — Encounter (HOSPITAL_BASED_OUTPATIENT_CLINIC_OR_DEPARTMENT_OTHER): Payer: Self-pay

## 2023-12-12 NOTE — Progress Notes (Unsigned)
 CARDIOLOGY CONSULT NOTE       Patient ID: Cynthia Fox MRN: [id number] DOB/AGE: 55-16-1972 55 y.o.  Admit date: (Not on file) Referring Physician: Cheryl Birmingham Primary Physician: Amon Aloysius BRAVO, MD Primary Cardiologist: New Reason for Consultation: R/O PHT  Active Problems:   * No active hospital problems. *   HPI:  53 y.o. with systemic sclerosis, graves, Sjogren's Seen by rheumatology 12/06/23. Note indicates concern for ILD and pulmonary hTN given diagnosis of systemic sclerosis. Referred to pulmonary and was to have high res CT chest. Has not had TTE. ANA positive 1:40  Centromere protein A/B Ab positive, SSA positive > 8/. On chronic  plaquenil . Labs with mild elevation in AST/ALT 37/48. Has chronic right sholder pain.    *** ROS All other systems reviewed and negative except as noted above  Past Medical History:  Diagnosis Date  . Compressive optic neuropathy   . Graves disease   . Hepatitis A   . Seasonal allergies   . Sjogren's disease (HCC)    positive ANA nad Ro  . Thyroid  disease   . Thyroid  eye disease     Family History  Problem Relation Age of Onset  . Hypertension Mother   . Retinal detachment Mother   . Thyroid  disease Mother   . Diabetes Father   . Thyroid  disease Sister   . Other Sister        tonsil   . Heart attack Maternal Grandfather        55  . Breast cancer Neg Hx   . Colon cancer Neg Hx   . Stomach cancer Neg Hx   . Pancreatic cancer Neg Hx   . Esophageal cancer Neg Hx     Social History   Socioeconomic History  . Marital status: Married    Spouse name: Not on file  . Number of children: 0  . Years of education: Not on file  . Highest education level: Not on file  Occupational History  . Occupation: shipping office   Tobacco Use  . Smoking status: Never    Passive exposure: Past  . Smokeless tobacco: Never  Vaping Use  . Vaping status: Never Used  Substance and Sexual Activity  . Alcohol use: Yes    Comment: weekends occ   . Drug use: No  . Sexual activity: Yes    Partners: Male    Birth control/protection: Post-menopausal    Comment: 1st intercourse- 21, partners- 4, married- 18 yrs   Other Topics Concern  . Not on file  Social History Narrative   Original from [place]    Lives w/ husband   Social Drivers of Corporate investment banker Strain: Not on file  Food Insecurity: Not on file  Transportation Needs: Not on file  Physical Activity: Not on file  Stress: Not on file  Social Connections: Unknown (08/20/2021)   [facility] from [facility]   Social Network   . Social Network: Not on file  Intimate Partner Violence: Unknown (07/13/2021)   [facility]   HITS   . Physically Hurt: Not on file   . Insult or Talk Down To: Not on file   . Threaten Physical Harm: Not on file   . Scream or Curse: Not on file    Past Surgical History:  Procedure Laterality Date  . EYE SURGERY Bilateral 2017, 2018   release of optic pressure   . FUNCTIONAL ENDOSCOPIC SINUS SURGERY Bilateral 05/2015  . LIPOMA EXCISION Right 02/24/2023  Procedure: EXCISION LIPOMA RIGHT AXILLA;  Surgeon: Curvin Deward MOULD, MD;  Location: [facility];  Service: General;  Laterality: Right;  . Orbital Decompression Bilateral 05/2015      Current Outpatient Medications:  .  calcium carbonate (OS-CAL - DOSED IN MG OF ELEMENTAL CALCIUM) 1250 (500 Ca) MG tablet, Take 1 tablet by mouth., Disp: , Rfl:  .  Cholecalciferol 125 MCG (5000 UT) capsule, Take by mouth. (Patient not taking: Reported on 12/06/2023), Disp: , Rfl:  .  cycloSPORINE (RESTASIS) 0.05 % ophthalmic emulsion, Apply to eye., Disp: , Rfl:  .  hydroxychloroquine  (PLAQUENIL ) 200 MG tablet, TAKE 1 TABLET(200 MG) BY MOUTH DAILY, Disp: 90 tablet, Rfl: 0 .  MELATONIN PO, Take by mouth., Disp: , Rfl:  .  oxyCODONE  (ROXICODONE ) 5 MG immediate release tablet, Take 1 tablet (5 mg total) by mouth every 6 (six) hours as needed for severe pain (pain score 7-10).  (Patient not taking: Reported on 12/06/2023), Disp: 15 tablet, Rfl: 0 .  pilocarpine  (SALAGEN ) 5 MG tablet, Take 1 tablet (5 mg total) by mouth 2 (two) times daily as needed., Disp: 180 tablet, Rfl: 0 .  VITAMIN D  PO, Take by mouth 2 (two) times a week., Disp: , Rfl:     Physical Exam: Last menstrual period 05/16/2016.    Affect appropriate Healthy:  appears stated age HEENT: normal Neck supple with no adenopathy JVP normal no bruits no thyromegaly Lungs clear with no wheezing and good diaphragmatic motion Heart:  S1/S2 no murmur, no rub, gallop or click PMI normal prior right axillary lipoma surgery  Abdomen: benighn, BS positve, no tenderness, no AAA no bruit.  No HSM or HJR Distal pulses intact with no bruits No edema Neuro non-focal Skin warm and dry No muscular weakness   Labs:   Lab Results  Component Value Date   WBC 7.2 11/28/2023   HGB 13.5 11/28/2023   HCT 41.8 11/28/2023   MCV 101.7 (H) 11/28/2023   PLT 274 11/28/2023   No results for input(s): NA, K, CL, CO2, BUN, CREATININE, CALCIUM, PROT, BILITOT, ALKPHOS, ALT, AST, GLUCOSE in the last 168 hours.  Invalid input(s): LABALBU Lab Results  Component Value Date   CKTOTAL 124 11/28/2023    Lab Results  Component Value Date   CHOL 160 01/24/2023   CHOL 189 01/17/2022   CHOL 167 12/25/2020   Lab Results  Component Value Date   HDL 48.00 01/24/2023   HDL 65.80 01/17/2022   HDL 59 12/25/2020   Lab Results  Component Value Date   LDLCALC 90 01/24/2023   LDLCALC 103 (H) 01/17/2022   LDLCALC 85 12/25/2020   Lab Results  Component Value Date   TRIG 113.0 01/24/2023   TRIG 99.0 01/17/2022   TRIG 131 12/25/2020   Lab Results  Component Value Date   CHOLHDL 3 01/24/2023   CHOLHDL 3 01/17/2022   CHOLHDL 2.8 12/25/2020   No results found for: LDLDIRECT    Radiology: No results found.  EKG: ***   ASSESSMENT AND PLAN:   PHT:  concern for given systemic sclerosis.  Will order echo to assess LV/RV function estimate PA pressures based on TR velocity. Will order non contrast chest CT not done yet. F/U [facility] pulmonary for ILD concern 2. Systemic Sclerosis:  on plaquenil  *** 3. Graves:  prior orbital decompression TSH normal 01/23/33 f/u primary  TTE Non constrast chest CT  F/U PRN pending echo results  Signed: Maude Emmer 12/12/2023, 8:33 AM

## 2023-12-14 ENCOUNTER — Ambulatory Visit: Attending: Cardiovascular Disease | Admitting: Cardiovascular Disease

## 2023-12-14 VITALS — BP 134/70 | HR 82 | Ht 60.0 in | Wt 155.0 lb

## 2023-12-14 DIAGNOSIS — I2729 Other secondary pulmonary hypertension: Secondary | ICD-10-CM | POA: Diagnosis not present

## 2023-12-14 DIAGNOSIS — E0500 Thyrotoxicosis with diffuse goiter without thyrotoxic crisis or storm: Secondary | ICD-10-CM

## 2023-12-14 DIAGNOSIS — R768 Other specified abnormal immunological findings in serum: Secondary | ICD-10-CM | POA: Diagnosis not present

## 2023-12-14 NOTE — Patient Instructions (Signed)
 Medication Instructions:  Your physician recommends that you continue on your current medications as directed. Please refer to the Current Medication list given to you today.  *If you need a refill on your cardiac medications before your next appointment, please call your pharmacy*  Lab Work: If you have labs (blood work) drawn today and your tests are completely normal, you will receive your results only by: MyChart Message (if you have MyChart) OR A paper copy in the mail If you have any lab test that is abnormal or we need to change your treatment, we will call you to review the results.  Testing/Procedures: Your physician has requested that you have an echocardiogram. Echocardiography is a painless test that uses sound waves to create images of your heart. It provides your doctor with information about the size and shape of your heart and how well your heart's chambers and valves are working. This procedure takes approximately one hour. There are no restrictions for this procedure. Please do NOT wear cologne, perfume, aftershave, or lotions (deodorant is allowed). Please arrive 15 minutes prior to your appointment time.  Please note: We ask at that you not bring children with you during ultrasound (echo/ vascular) testing. Due to room size and safety concerns, children are not allowed in the ultrasound rooms during exams. Our front office staff cannot provide observation of children in our lobby area while testing is being conducted. An adult accompanying a patient to their appointment will only be allowed in the ultrasound room at the discretion of the ultrasound technician under special circumstances. We apologize for any inconvenience. Chest CT scanning, (CAT scanning), is a noninvasive, special x-ray that produces cross-sectional images of the body using x-rays and a computer. CT scans help physicians diagnose and treat medical conditions. For some CT exams, a contrast material is used to  enhance visibility in the area of the body being studied. CT scans provide greater clarity and reveal more details than regular x-ray exams.   Follow-Up: At [facility], you and your health needs are our priority.  As part of our continuing mission to provide you with exceptional heart care, our providers are all part of one team.  This team includes your primary Cardiologist (physician) and Advanced Practice Providers or APPs (Physician Assistants and Nurse Practitioners) who all work together to provide you with the care you need, when you need it.  Your next appointment:   As needed  Provider:   Dr. Delford    We recommend signing up for the patient portal called MyChart.  Sign up information is provided on this After Visit Summary.  MyChart is used to connect with patients for Virtual Visits (Telemedicine).  Patients are able to view lab/test results, encounter notes, upcoming appointments, etc.  Non-urgent messages can be sent to your provider as well.   To learn more about what you can do with MyChart, go to [email or web address].

## 2024-01-06 ENCOUNTER — Encounter: Payer: Self-pay | Admitting: Emergency Medicine

## 2024-01-06 ENCOUNTER — Ambulatory Visit
Admission: EM | Admit: 2024-01-06 | Discharge: 2024-01-06 | Disposition: A | Discharge status: Home / Self Care | Attending: Nurse Practitioner | Admitting: Nurse Practitioner

## 2024-01-06 DIAGNOSIS — H1033 Unspecified acute conjunctivitis, bilateral: Secondary | ICD-10-CM

## 2024-01-06 MED ORDER — POLYMYXIN B-TRIMETHOPRIM 10000-0.1 UNIT/ML-% OP SOLN: 1.0000 [drp] | OPHTHALMIC | 0 refills | Status: DC

## 2024-01-06 MED ORDER — CETIRIZINE HCL 10 MG PO TABS: 10.0000 mg | ORAL_TABLET | Freq: Every day | ORAL | 0 refills | Status: AC

## 2024-01-06 MED ORDER — DIPHENHYDRAMINE HCL 25 MG PO TABS: 25.0000 mg | ORAL_TABLET | Freq: Four times a day (QID) | ORAL | 0 refills | Status: DC | PRN

## 2024-01-06 NOTE — ED Provider Notes (Signed)
 [name]    CSN: [id number] Arrival date & time: 01/06/24  1200      History   Chief Complaint Chief Complaint  Patient presents with   Eye Problem    HPI Cynthia Fox is a 53 y.o. female.   Discussed the use of AI scribe software for clinical note transcription with the patient, who gave verbal consent to proceed.   The patient presents with bilateral eye redness that began yesterday. She reports watery eyes and significant itching but denies pain or changes in vision. Both eyes are equally affected. She does not wear glasses or contact lenses. There has been no green or yellow discharge, recent respiratory illness, or history of trauma to the eyes. She denies use of false eyelashes but notes that she had a lash lift procedure four days ago and is concerned this may be related. No associated symptoms such as headache, dizziness, sneezing, or rhinorrhea are reported. She has tried over-the-counter allergy eye drops, which provided mild relief of itching but did not completely resolve her symptoms.  The following sections of the patient's history were reviewed and updated as appropriate: allergies, current medications, past family history, past medical history, past social history, past surgical history, and problem list.     Past Medical History:  Diagnosis Date   Compressive optic neuropathy    Graves disease    Hepatitis A    Seasonal allergies    Sjogren's disease    positive ANA nad Ro   Thyroid  disease    Thyroid  eye disease     Patient Active Problem List   Diagnosis Date Noted   H. 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   Annual physical exam 10/24/2016   Menopause 10/13/2016   H/O seasonal allergies 03/23/2016   Vitamin D  deficiency 03/23/2016   High risk medication use 03/22/2016   Sjogren's syndrome (HCC) 06/25/2015   PCP NOTES >>>>>>>>>>>>>>>>>>>>>> 06/25/2015   Graves disease 06/01/2015    Hyperprolactinemia 01/12/2015   Elevated antinuclear antibody (ANA) level 07/23/2014    Past Surgical History:  Procedure Laterality Date   EYE SURGERY Bilateral 2017, 2018   release of optic pressure    FUNCTIONAL ENDOSCOPIC SINUS SURGERY Bilateral 05/2015   LIPOMA EXCISION Right 02/24/2023   Procedure: EXCISION LIPOMA RIGHT AXILLA;  Surgeon: Curvin Deward MOULD, MD;  Location: [facility];  Service: General;  Laterality: Right;   Orbital Decompression Bilateral 05/2015    OB History     Gravida  0   Para  0   Term  0   Preterm  0   AB  0   Living  0      SAB  0   IAB  0   Ectopic  0   Multiple  0   Live Births               Home Medications    Prior to Admission medications   Medication Sig Start Date End Date Taking? Authorizing Provider  cetirizine (ZYRTEC) 10 MG tablet Take 1 tablet (10 mg total) by mouth daily. 01/06/24  Yes Iola Lukes, FNP  diphenhydrAMINE (BENADRYL) 25 MG tablet Take 1 tablet (25 mg total) by mouth every 6 (six) hours as needed for itching. 01/06/24  Yes Ola Fawver, FNP  trimethoprim-polymyxin b (POLYTRIM) ophthalmic solution Place 1 drop into both eyes every 3 (three) hours while awake. 01/06/24  Yes Iola Lukes, FNP  calcium carbonate (OS-CAL - DOSED IN MG  OF ELEMENTAL CALCIUM) 1250 (500 Ca) MG tablet Take 1 tablet by mouth.    [provider]  Cholecalciferol 125 MCG (5000 UT) capsule Take by mouth. Patient not taking: Reported on 12/06/2023    [provider]  cycloSPORINE (RESTASIS) 0.05 % ophthalmic emulsion Apply to eye. 05/30/22   [provider]  hydroxychloroquine  (PLAQUENIL ) 200 MG tablet TAKE 1 TABLET(200 MG) BY MOUTH DAILY 11/28/23   Cheryl Waddell HERO, PA-C  oxyCODONE  (ROXICODONE ) 5 MG immediate release tablet Take 1 tablet (5 mg total) by mouth every 6 (six) hours as needed for severe pain (pain score 7-10). Patient not taking: Reported on 12/06/2023 02/24/23   Curvin Mt III,  MD  pilocarpine  (SALAGEN ) 5 MG tablet Take 1 tablet (5 mg total) by mouth 2 (two) times daily as needed. 08/28/23   Cheryl Waddell HERO, PA-C  VITAMIN D  PO Take by mouth 2 (two) times a week.    [provider]    Family History Family History  Problem Relation Age of Onset   Hypertension Mother    Retinal detachment Mother    Thyroid  disease Mother    Diabetes Father    Thyroid  disease Sister    Other Sister        tonsil    Heart attack Maternal Grandfather        44   Breast cancer Neg Hx    Colon cancer Neg Hx    Stomach cancer Neg Hx    Pancreatic cancer Neg Hx    Esophageal cancer Neg Hx     Social History Social History   Tobacco Use   Smoking status: Never    Passive exposure: Past   Smokeless tobacco: Never  Vaping Use   Vaping status: Never Used  Substance Use Topics   Alcohol use: Yes    Comment: weekends occ   Drug use: No     Allergies   Patient has no known allergies.   Review of Systems Review of Systems  Constitutional: Negative.   HENT: Negative.    Eyes:  Positive for discharge (watery), redness and itching. Negative for pain and visual disturbance.  Respiratory: Negative.    Neurological:  Negative for dizziness and headaches.  All other systems reviewed and are negative.    Physical Exam Triage Vital Signs ED Triage Vitals [01/06/24 1417]  Encounter Vitals Group     BP 132/84     Girls Systolic BP Percentile      Girls Diastolic BP Percentile      Boys Systolic BP Percentile      Boys Diastolic BP Percentile      Pulse Rate 81     Resp 16     Temp 97.8 F (36.6 C)     Temp Source Oral     SpO2 98 %     Weight      Height      Head Circumference      Peak Flow      Pain Score 0     Pain Loc      Pain Education      Exclude from Growth Chart    No data found.  Updated Vital Signs BP 132/84 (BP Location: Left Arm)   Pulse 81   Temp 97.8 F (36.6 C) (Oral)   Resp 16   LMP 05/16/2016   SpO2 98%   Visual  Acuity Right Eye Distance: 20/40 Left Eye Distance: 20/40 Bilateral Distance: 20/30-1  Right Eye Near:  Left Eye Near:    Bilateral Near:     Physical Exam Vitals reviewed.  Constitutional:      General: She is awake. She is not in acute distress.    Appearance: Normal appearance. She is well-developed. She is not ill-appearing, toxic-appearing or diaphoretic.  HENT:     Head: Normocephalic.     Right Ear: Hearing normal.     Left Ear: Hearing normal.     Nose: Nose normal.     Mouth/Throat:     Mouth: Mucous membranes are moist.  Eyes:     General: Lids are normal. Lids are everted, no foreign bodies appreciated. Vision grossly intact. Gaze aligned appropriately. No visual field deficit.       Right eye: No foreign body, discharge or hordeolum.        Left eye: No foreign body, discharge or hordeolum.     Extraocular Movements: Extraocular movements intact.     Conjunctiva/sclera:     Right eye: Right conjunctiva is injected.     Left eye: Left conjunctiva is injected.     Pupils: Pupils are equal, round, and reactive to light.  Cardiovascular:     Rate and Rhythm: Normal rate and regular rhythm.     Heart sounds: Normal heart sounds.  Pulmonary:     Effort: Pulmonary effort is normal.     Breath sounds: Normal breath sounds and air entry.  Musculoskeletal:        General: Normal range of motion.     Cervical back: Normal range of motion and neck supple.  Skin:    General: Skin is warm and dry.  Neurological:     General: No focal deficit present.     Mental Status: She is alert and oriented to person, place, and time.  Psychiatric:        Speech: Speech normal.        Behavior: Behavior is cooperative.      UC Treatments / Results  Labs (all labs ordered are listed, but only abnormal results are displayed) Labs Reviewed - No data to display  EKG   Radiology No results found.  Procedures Procedures (including critical care time)  Medications Ordered  in UC Medications - No data to display  Initial Impression / Assessment and Plan / UC Course  I have reviewed the triage vital signs and the nursing notes.  Pertinent labs & imaging results that were available during my care of the patient were reviewed by me and considered in my medical decision making (see chart for details).     Patient presents with acute bilateral eye redness, watery discharge, and itching without pain or vision changes. Examination and history are most consistent with viral conjunctivitis or possible irritant conjunctivitis related to a recent lash lift procedure. Allergic conjunctivitis is less likely given the absence of other allergy symptoms, and bacterial conjunctivitis is unlikely given the lack of purulent discharge. Treatment initiated with Polytrim eye drops every 3 hours while awake, along with over-the-counter lubricating eye drops (Refresh) as needed for comfort, to be administered at least 30 minutes after the prescription drops. Cetirizine daily and oral diphenhydramine as needed for breakthrough itching. Supportive care measures reviewed, with instructions for close monitoring of symptoms. Patient advised to follow up if no improvement or worsening within several days, or sooner for new pain, vision changes, or increased discharge.  Today's evaluation has revealed no signs of a dangerous process. Discussed diagnosis with patient and/or guardian. Patient and/or guardian aware  of their diagnosis, possible red flag symptoms to watch out for and need for close follow up. Patient and/or guardian understands verbal and written discharge instructions. Patient and/or guardian comfortable with plan and disposition.  Patient and/or guardian has a clear mental status at this time, good insight into illness (after discussion and teaching) and has clear judgment to make decisions regarding their care  Documentation was completed with the aid of voice recognition software.  Transcription may contain typographical errors.  Final Clinical Impressions(s) / UC Diagnoses   Final diagnoses:  Acute conjunctivitis of both eyes, unspecified acute conjunctivitis type     Discharge Instructions      Hoy fue atendida por enrojecimiento e irritacin en ambos ojos. Segn su examen e historial, esto parece deberse ms probablemente a una infeccin viral o a una irritacin causada por su procedimiento reciente de levantamiento de pestaas. En este momento, no hay seales de una infeccin ocular bacteriana ni alrgica. Se le recetaron gotas oftlmicas Polytrim para usar cada tres horas mientras est despierta. Adems, puede usar gotas lubricantes de venta libre, como Refresh, si sus ojos se sienten secos o irritados, pero espere al menos 30 minutos despus de [employer] gotas recetadas antes de usarlas. Para ayudar con la picazn, tome la cetirizina recetada una vez al da y use el Benadryl recetado por va oral segn sea necesario, pero tenga en cuenta que el Benadryl puede causarle somnolencia. Para apoyar su recuperacin, evite frotarse los ojos, no comparta toallas ni almohadas con otras personas y [employer] con frecuencia para prevenir la propagacin de la infeccin. Tambin puede usar compresas fras sobre los ojos para mayor comodidad. Asegrese de [employer] fundas de sus almohadas con frecuencia hasta que los sntomas desaparezcan. Haga un seguimiento con un especialista en ojos si sus sntomas no mejoran dentro de tres a [employer], si empeoran o si la irritacin se vuelve intolerable. Acuda de inmediato a la sala de emergencias si desarrolla cambios repentinos en la visin, dolor ocular intenso, hinchazn alrededor [employer] ojos, secrecin espesa de color amarillo o verde, o si comienza a sentirse muy mal.  You were seen today for redness and irritation in both eyes. Based on your exam and history, this appears most likely due to either a viral infection or irritation  from your recent lash lift procedure. At this time, there are no signs of a bacterial or allergic eye infection. You were prescribed Polytrim eye drops to use every three hours while awake. In addition, you may use over-the-counter lubricating eye drops such as Refresh if your eyes feel dry or irritated, but wait at least 30 minutes after using the prescription drops before applying them. To help with itching, take the prescribed cetirizine once a day and use the prescribed Benadryl by mouth as needed, but keep in mind that Benadryl can make you sleepy. To support your recovery, avoid rubbing your eyes, do not share towels or pillows with others, and wash your hands often to prevent spreading infection. You may also use cool compresses over your eyes for comfort. Be sure to change your pillow cases frequently until your symptoms resolve.  Follow up with an eye specialist if your symptoms do not improve within three to five days, if they worsen, or if you are unable to tolerate the irritation. Go to the emergency department right away if you develop sudden changes in vision, severe eye pain, swelling around the eyes, thick yellow or green drainage, or if you begin feeling  very unwell.     ED Prescriptions     Medication Sig Dispense Auth. Provider   trimethoprim-polymyxin b (POLYTRIM) ophthalmic solution Place 1 drop into both eyes every 3 (three) hours while awake. 10 mL Iola Lukes, FNP   cetirizine (ZYRTEC) 10 MG tablet Take 1 tablet (10 mg total) by mouth daily. 14 tablet Iola Lukes, FNP   diphenhydrAMINE (BENADRYL) 25 MG tablet Take 1 tablet (25 mg total) by mouth every 6 (six) hours as needed for itching. 15 tablet Iola Lukes, FNP      PDMP not reviewed this encounter.   Iola Lukes, [place] 01/06/24 1504

## 2024-01-06 NOTE — ED Triage Notes (Signed)
 Pt c/o bil eye redness and itching  Onset yesterday  Pt denies any vision problems

## 2024-01-06 NOTE — Discharge Instructions (Addendum)
 Hoy fue atendida por enrojecimiento e irritacin en ambos ojos. Segn su examen e historial, esto parece deberse ms probablemente a una infeccin viral o a una irritacin causada por su procedimiento reciente de levantamiento de pestaas. En este momento, no hay seales de una infeccin ocular bacteriana ni alrgica. Se le recetaron gotas oftlmicas Polytrim para usar cada tres horas mientras est despierta. Adems, puede usar gotas lubricantes de venta libre, como Refresh, si sus ojos se sienten secos o irritados, pero espere al menos 30 minutos despus de [employer] gotas recetadas antes de usarlas. Para ayudar con la picazn, tome la cetirizina recetada una vez al da y use el Benadryl recetado por va oral segn sea necesario, pero tenga en cuenta que el Benadryl puede causarle somnolencia. Para apoyar su recuperacin, evite frotarse los ojos, no comparta toallas ni almohadas con otras personas y [employer] con frecuencia para prevenir la propagacin de la infeccin. Tambin puede usar compresas fras sobre los ojos para mayor comodidad. Asegrese de [employer] fundas de sus almohadas con frecuencia hasta que los sntomas desaparezcan. Haga un seguimiento con un especialista en ojos si sus sntomas no mejoran dentro de tres a [employer], si empeoran o si la irritacin se vuelve intolerable. Acuda de inmediato a la sala de emergencias si desarrolla cambios repentinos en la visin, dolor ocular intenso, hinchazn alrededor [employer] ojos, secrecin espesa de color amarillo o verde, o si comienza a sentirse muy mal.  You were seen today for redness and irritation in both eyes. Based on your exam and history, this appears most likely due to either a viral infection or irritation from your recent lash lift procedure. At this time, there are no signs of a bacterial or allergic eye infection. You were prescribed Polytrim eye drops to use every three hours while awake. In addition, you may use over-the-counter  lubricating eye drops such as Refresh if your eyes feel dry or irritated, but wait at least 30 minutes after using the prescription drops before applying them. To help with itching, take the prescribed cetirizine once a day and use the prescribed Benadryl by mouth as needed, but keep in mind that Benadryl can make you sleepy. To support your recovery, avoid rubbing your eyes, do not share towels or pillows with others, and wash your hands often to prevent spreading infection. You may also use cool compresses over your eyes for comfort. Be sure to change your pillow cases frequently until your symptoms resolve.  Follow up with an eye specialist if your symptoms do not improve within three to five days, if they worsen, or if you are unable to tolerate the irritation. Go to the emergency department right away if you develop sudden changes in vision, severe eye pain, swelling around the eyes, thick yellow or green drainage, or if you begin feeling very unwell.

## 2024-01-17 ENCOUNTER — Encounter: Payer: Self-pay | Admitting: Pulmonary Disease

## 2024-01-17 ENCOUNTER — Ambulatory Visit: Admitting: Pulmonary Disease

## 2024-01-17 VITALS — BP 108/68 | HR 74 | Temp 98.7°F | Ht 60.0 in | Wt 161.2 lb

## 2024-01-17 DIAGNOSIS — M349 Systemic sclerosis, unspecified: Secondary | ICD-10-CM

## 2024-01-17 LAB — HM MAMMOGRAPHY

## 2024-01-17 NOTE — Patient Instructions (Signed)
 It is nice to meet you  Scleroderma and Sjogren's which are autoimmune can affect the heart and the lungs  I look forward to seeing the result of the echocardiogram  I ordered a different CT scan of the chest, high-resolution CT of the chest which gives us  the best pictures to evaluate if there is any early scarring in the lung related to these issues  Since you are feeling well with no symptoms, it is a good time to get baseline pulmonary function test.  We can use these in the future as a comparison to see if anything has changed.  Return to clinic as needed based on results

## 2024-01-17 NOTE — Progress Notes (Signed)
 "  @Patient  ID: Cynthia Fox, female    DOB: 04-06-1971, 53 y.o.   MRN: [id number]  No chief complaint on file.   Referring provider: Cheryl Waddell Cynthia Fox  HPI:   53 y.o. woman with history of scleroderma here for proactive evaluation of pulmonary function given scleroderma/connective tissue disease.  Multiple prior rheumatology notes reviewed.  Most recent cardiology note reviewed.  Patient is been diagnosed scleroderma.  Based on symptoms and serologies.  She denies significant respiratory symptoms.  No cough no shortness of breath.  No been told she has crackles or abnormal breath sounds.  As part of this proactive evaluation of her scleroderma she was seen by cardiology.  Echocardiogram and CT scan ordered.  Neither performed.  We discussed CT high-resolution as a pertains to ILD and scleroderma or connective tissue disease.  She agreed to move forward with this.  We discussed pulmonary function test as a baseline.  To compare to the future if she develop worsening symptoms.  She agrees to this.  We discussed pulmonary hypertension as relates scleroderma and the importance of the upcoming echocardiogram.    Questionaires / Pulmonary Flowsheets:   ACT:      No data to display          MMRC:     No data to display          Epworth:      No data to display          Tests:   FENO:  No results found for: NITRICOXIDE  PFT:    Latest Ref Rng & Units 03/28/2024    3:39 PM  PFT Results  FVC-Pre L 2.19   FVC-Predicted Pre % 76   FVC-Post L 2.59   FVC-Predicted Post % 90   Pre FEV1/FVC % % 92   Post FEV1/FCV % % 83   FEV1-Pre L 2.00   FEV1-Predicted Pre % 89   FEV1-Post L 2.14   DLCO uncorrected ml/min/mmHg 15.80   DLCO UNC% % 90   DLVA Predicted % 85   TLC L 6.59   TLC % Predicted % 152   RV % Predicted % 240     WALK:      No data to display          Imaging: No results found.  Lab Results: Personally reviewed CBC    Component Value  Date/Time   WBC 7.2 11/28/2023 1525   RBC 4.11 11/28/2023 1525   HGB 13.5 11/28/2023 1525   HGB 12.1 11/21/2016 1510   HCT 41.8 11/28/2023 1525   HCT 36.3 11/21/2016 1510   PLT 274 11/28/2023 1525   PLT 281 11/21/2016 1510   MCV 101.7 (H) 11/28/2023 1525   MCV 100 (H) 11/21/2016 1510   MCH 32.8 11/28/2023 1525   MCHC 32.3 11/28/2023 1525   RDW 13.0 11/28/2023 1525   RDW 13.6 11/21/2016 1510   LYMPHSABS 1.6 01/24/2023 0836   LYMPHSABS 2.1 11/21/2016 1510   MONOABS 0.4 01/24/2023 0836   EOSABS 468 11/28/2023 1525   EOSABS 0.2 11/21/2016 1510   BASOSABS 72 11/28/2023 1525   BASOSABS 0.0 11/21/2016 1510    BMET    Component Value Date/Time   NA 139 11/28/2023 1525   NA 140 11/21/2016 1510   K 4.0 11/28/2023 1525   CL 101 11/28/2023 1525   CO2 26 11/28/2023 1525   GLUCOSE 99 11/28/2023 1525   BUN 17 11/28/2023 1525   BUN 15 11/21/2016 1510  CREATININE 1.03 11/28/2023 1525   CALCIUM 9.6 11/28/2023 1525   GFRNONAA 101 09/16/2020 1526   GFRAA 117 09/16/2020 1526    BNP No results found for: BNP  ProBNP No results found for: PROBNP  Specialty Problems   None   Allergies[1]  Immunization History  Administered Date(s) Administered   Influenza, Seasonal, Injecte, Preservative Fre 01/05/2023, 01/22/2024   Influenza,inj,Quad PF,6+ Mos 12/24/2018   Influenza-Unspecified 12/19/2019, 12/10/2021, 01/05/2023   PFIZER(Purple Top)SARS-COV-2 Vaccination 07/04/2019, 07/25/2019, 03/10/2020   Tdap 07/30/2012, 01/20/2023   Zoster Recombinant(Shingrix) 09/01/2021, 02/15/2022    Past Medical History:  Diagnosis Date   Compressive optic neuropathy    Graves disease    Hepatitis A    Seasonal allergies    Sjogren's disease    positive ANA nad Ro   Thyroid  disease    Thyroid  eye disease     Tobacco History: Tobacco Use History[2] Counseling given: Not Answered   Continue to not smoke  Outpatient Encounter Medications as of 01/17/2024  Medication Sig   calcium  carbonate (OS-CAL - DOSED IN MG OF ELEMENTAL CALCIUM) 1250 (500 Ca) MG tablet Take 1 tablet by mouth.   cetirizine  (ZYRTEC ) 10 MG tablet Take 1 tablet (10 mg total) by mouth daily. (Patient not taking: Reported on 01/22/2024)   Cholecalciferol 125 MCG (5000 UT) capsule Take by mouth.   cycloSPORINE (RESTASIS) 0.05 % ophthalmic emulsion Apply to eye.   hydroxychloroquine  (PLAQUENIL ) 200 MG tablet TAKE 1 TABLET(200 MG) BY MOUTH DAILY   VITAMIN D  PO Take by mouth 2 (two) times a week.   [DISCONTINUED] pilocarpine  (SALAGEN ) 5 MG tablet Take 1 tablet (5 mg total) by mouth 2 (two) times daily as needed.   [DISCONTINUED] trimethoprim -polymyxin b  (POLYTRIM ) ophthalmic solution Place 1 drop into both eyes every 3 (three) hours while awake. (Patient not taking: Reported on 01/22/2024)   [DISCONTINUED] diphenhydrAMINE  (BENADRYL ) 25 MG tablet Take 1 tablet (25 mg total) by mouth every 6 (six) hours as needed for itching.   [DISCONTINUED] oxyCODONE  (ROXICODONE ) 5 MG immediate release tablet Take 1 tablet (5 mg total) by mouth every 6 (six) hours as needed for severe pain (pain score 7-10). (Patient not taking: Reported on 12/06/2023)   No facility-administered encounter medications on file as of 01/17/2024.     Review of Systems  Review of Systems  Comprehensive review systems negative unless mentioned in HPI above Physical Exam  BP 108/68   Pulse 74   Temp 98.7 F (37.1 C) (Oral)   Ht 5' (1.524 m)   Wt 161 lb 3.2 oz (73.1 kg)   LMP 05/16/2016   SpO2 99%   BMI 31.48 kg/m   Wt Readings from Last 5 Encounters:  01/22/24 160 lb 8 oz (72.8 kg)  01/17/24 161 lb 3.2 oz (73.1 kg)  12/14/23 155 lb (70.3 kg)  12/06/23 160 lb 6.4 oz (72.8 kg)  11/28/23 159 lb (72.1 kg)    BMI Readings from Last 5 Encounters:  01/22/24 31.35 kg/m  01/17/24 31.48 kg/m  12/14/23 30.27 kg/m  12/06/23 31.33 kg/m  11/28/23 31.05 kg/m     Physical Exam General: Sitting in chair, no distress Eyes: EOMI Neck:  No JVP Pulmonary: Clear, normal work of breathing Cardiovascular: Warm, no edema   Assessment & Plan:   Scleroderma: New evaluation.  Based on prior rheumatology diagnosis.  Fortunately, no significant symptoms, no respiratory symptoms.  High-resolution CT scan and PFTs ordered to have as baseline.  Upcoming echocardiogram scheduled to evaluate or screen for pulmonary hypertension.  Return if symptoms worsen or fail to improve.   Cynthia JONELLE Beals, MD 04/10/2024      [1] No Known Allergies [2]  Social History Tobacco Use  Smoking Status Never   Passive exposure: Past  Smokeless Tobacco Never   "

## 2024-01-19 ENCOUNTER — Ambulatory Visit: Payer: Self-pay | Admitting: Cardiovascular Disease

## 2024-01-19 ENCOUNTER — Ambulatory Visit (HOSPITAL_COMMUNITY)
Admission: RE | Admit: 2024-01-19 | Discharge: 2024-01-19 | Disposition: A | Discharge status: Home / Self Care | Source: Ambulatory Visit | Attending: Cardiovascular Disease | Admitting: Cardiovascular Disease

## 2024-01-19 DIAGNOSIS — I2729 Other secondary pulmonary hypertension: Secondary | ICD-10-CM

## 2024-01-19 DIAGNOSIS — E0500 Thyrotoxicosis with diffuse goiter without thyrotoxic crisis or storm: Secondary | ICD-10-CM | POA: Diagnosis not present

## 2024-01-19 DIAGNOSIS — R7689 Other specified abnormal immunological findings in serum: Secondary | ICD-10-CM | POA: Diagnosis present

## 2024-01-19 DIAGNOSIS — H05839 Thyroid orbitopathy, unspecified orbit: Secondary | ICD-10-CM | POA: Diagnosis not present

## 2024-01-19 LAB — ECHOCARDIOGRAM COMPLETE
Area-P 1/2: 3.91 cm2
S' Lateral: 2.3 cm

## 2024-01-22 ENCOUNTER — Ambulatory Visit: Payer: Managed Care, Other (non HMO) | Admitting: Internal Medicine

## 2024-01-22 ENCOUNTER — Encounter: Payer: Self-pay | Admitting: Internal Medicine

## 2024-01-22 VITALS — BP 124/66 | HR 84 | Temp 98.3°F | Resp 16 | Ht 60.0 in | Wt 160.5 lb

## 2024-01-22 DIAGNOSIS — E559 Vitamin D deficiency, unspecified: Secondary | ICD-10-CM | POA: Diagnosis not present

## 2024-01-22 DIAGNOSIS — E0500 Thyrotoxicosis with diffuse goiter without thyrotoxic crisis or storm: Secondary | ICD-10-CM | POA: Diagnosis not present

## 2024-01-22 DIAGNOSIS — Z23 Encounter for immunization: Secondary | ICD-10-CM

## 2024-01-22 DIAGNOSIS — M3500 Sicca syndrome, unspecified: Secondary | ICD-10-CM

## 2024-01-22 DIAGNOSIS — Z0000 Encounter for general adult medical examination without abnormal findings: Secondary | ICD-10-CM | POA: Diagnosis not present

## 2024-01-22 DIAGNOSIS — Z1272 Encounter for screening for malignant neoplasm of vagina: Secondary | ICD-10-CM

## 2024-01-22 NOTE — Patient Instructions (Signed)
 GO TO THE LAB :  Get the blood work    Then, go to the front desk for the checkout Please make an appointment for a physical exam in 1 year   You are a flu shot today Recommend to get a COVID booster this fall Also pneumonia shot (PNM 20)  We are referring you to a gynecologist    Please read more detailed instructions below     SYSTEMIC SCLEROSIS (SCLERODERMA): You are under rheumatology care for systemic sclerosis. -Continue management with your rheumatologist.  Thyroid  disease: You manage Graves' disease without endocrinology follow-up. -Check TSH annually.  OSTEOPENIA: Your bone density scan 2-3 years ago showed osteopenia with a T-score of -1.5. You are currently taking calcium with vitamin D  and vitamin D  supplementation. -Continue calcium and vitamin D  supplementation. - Consider a bone density re-evaluation next year.

## 2024-01-22 NOTE — Assessment & Plan Note (Signed)
 Here for CPX -Td 2024  -Shingrix x 2 -  Flu shot today - rec covid vax, PNM 20. - female care per gyn, Pap smear 03/01/2021, MMG October 2024 ([facility]).  Needs a new gynecology, referral placed. --CCS: Negative cologuard 01/05/2020 and 01/2023  -Labs reviewed.  Will get FLP, TSH, vitamin D  A1c

## 2024-01-22 NOTE — Assessment & Plan Note (Signed)
 Here for CPX   Other issues addressed today Systemic sclerosis (scleroderma), Sjogren's Managed by rheumatology. Seen by cardiology on 12/14/2023, refer for evaluation pulmonary hypertension in the context of autoimmune diseases.  Echo completed January 19, 2024 Saw pulmonary 01/17/2024, ordered a high-resolution CT chest to rule out pulmonary involvement of autoimmune diseases History of Graves' disease: Released from Endo, they recommend TSH annually.  Will do Osteopenia Menopausal 2017, October 2023: T-score of -1.5, on calcium and vitamin D  supplementation. Consider DEXA next year. GLP-1's: She mentioned would be interested on take this medications, recommend to clear that with rheumatology RTC 1 year

## 2024-01-22 NOTE — Progress Notes (Signed)
 Subjective:    Patient ID: Cynthia Fox Rank, female    DOB: [birth year], 53 y.o.   MRN: [id number]  DOS:  01/22/2024 CPX Discussed the use of AI scribe software for clinical note transcription with the patient, who gave verbal consent to proceed. History of Present Illness   Autoimmune diseases - Under rheumatology care   - Recent evaluated by pulmonary and cardiology specialists   - CT scan of the chest scheduled for tomorrow - No chest pain, shortness of breath, gastrointestinal, or urinary symptoms  Thyroid  dysfunction - History of Graves' disease   - No current symptoms attributable to hyperthyroidism or hypothyroidism  Ocular symptoms - History of conjunctivitis recently, resolved.  Bone health - Bone density scan 2-3 years ago showed osteopenia with a T-score of -1.5 - Currently taking calcium with vitamin D  and vitamin D  supplementation  Immunization status - Received three COVID-19 vaccinations - Considering influenza vaccination after recent conjunctivitis      Review of Systems  Other than above, a 14 point review of systems is negative    Past Medical History:  Diagnosis Date   Compressive optic neuropathy    Graves disease    Hepatitis A    Seasonal allergies    Sjogren's disease    positive ANA nad Ro   Thyroid  disease    Thyroid  eye disease     Past Surgical History:  Procedure Laterality Date   EYE SURGERY Bilateral 2017, 2018   release of optic pressure    FUNCTIONAL ENDOSCOPIC SINUS SURGERY Bilateral 05/2015   LIPOMA EXCISION Right 02/24/2023   Procedure: EXCISION LIPOMA RIGHT AXILLA;  Surgeon: Curvin Mt III, MD;  Location: [facility];  Service: General;  Laterality: Right;   Orbital Decompression Bilateral 05/2015    Current Outpatient Medications  Medication Instructions   calcium carbonate (OS-CAL - DOSED IN MG OF ELEMENTAL CALCIUM) 1250 (500 Ca) MG tablet 1 tablet   cetirizine (ZYRTEC) 10 mg, Oral, Daily    Cholecalciferol 125 MCG (5000 UT) capsule Take by mouth.   cycloSPORINE (RESTASIS) 0.05 % ophthalmic emulsion Apply to eye.   hydroxychloroquine  (PLAQUENIL ) 200 MG tablet TAKE 1 TABLET(200 MG) BY MOUTH DAILY   pilocarpine  (SALAGEN ) 5 mg, Oral, 2 times daily PRN   VITAMIN D  PO 2 times weekly       Objective:   Physical Exam BP 124/66   Pulse 84   Temp 98.3 F (36.8 C) (Oral)   Resp 16   Ht 5' (1.524 m)   Wt 160 lb 8 oz (72.8 kg)   LMP 05/16/2016   SpO2 98%   BMI 31.35 kg/m  General: Well developed, NAD, BMI noted Neck: No  thyromegaly  HEENT:  Normocephalic . Face symmetric, atraumatic Lungs:  CTA B Normal respiratory effort, no intercostal retractions, no accessory muscle use. Heart: RRR,  no murmur.  Abdomen:  Not distended, soft, non-tender. No rebound or rigidity.   Lower extremities: no pretibial edema bilaterally  Skin: Exposed areas without rash. Not pale. Not jaundice Neurologic:  alert & oriented X3.  Speech normal, gait appropriate for age and unassisted Strength symmetric and appropriate for age.  Psych: Cognition and judgment appear intact.  Cooperative with normal attention span and concentration.  Behavior appropriate. No anxious or depressed appearing.     Assessment      Assessment ENDO:    Dr Trixie --Yvone' disease dx 2016, ophthalmopathy dx ~ 04-2015 , had diplopia, s/p orbital decompression surgery ~ 05-2015.  Released by  Endo, needs TSH yearly per PCP --h/o hyperprolactinemia  Autoimmune diseases: Dr Margarita  Sjogren syndrome  Dx 2016, systemic sclerosis Menopausal 2017    Assessment & Plan  Here for CPX -Td 2024  -Shingrix x 2 -  Flu shot today - rec covid vax, PNM 20. - female care per gyn, Pap smear 03/01/2021, MMG October 2024 ([facility]).  Needs a new gynecology, referral placed. --CCS: Negative cologuard 01/05/2020 and 01/2023  -Labs reviewed.  Will get FLP, TSH, vitamin D  A1c      Other issues addressed today Systemic sclerosis  (scleroderma), Sjogren's Managed by rheumatology. Seen by cardiology on 12/14/2023, refer for evaluation pulmonary hypertension in the context of autoimmune diseases.  Echo completed January 19, 2024 Saw pulmonary 01/17/2024, ordered a high-resolution CT chest to rule out pulmonary involvement of autoimmune diseases History of Graves' disease: Released from Endo, they recommend TSH annually.  Will do Osteopenia Menopausal 2017, October 2023: T-score of -1.5, on calcium and vitamin D  supplementation. Consider DEXA next year. GLP-1's: She mentioned would be interested on take this medications, recommend to clear that with rheumatology RTC 1 year

## 2024-01-23 ENCOUNTER — Telehealth: Payer: Self-pay

## 2024-01-23 ENCOUNTER — Ambulatory Visit
Admission: RE | Admit: 2024-01-23 | Discharge: 2024-01-23 | Disposition: A | Discharge status: Home / Self Care | Source: Ambulatory Visit | Attending: Pulmonary Disease | Admitting: Pulmonary Disease

## 2024-01-23 DIAGNOSIS — M349 Systemic sclerosis, unspecified: Secondary | ICD-10-CM

## 2024-01-23 LAB — HEMOGLOBIN A1C: Hgb A1c MFr Bld: 5.9 % (ref 4.6–6.5)

## 2024-01-23 LAB — LIPID PANEL
Cholesterol: 165 mg/dL (ref 0–200)
HDL: 61.7 mg/dL (ref >39.00)
LDL Cholesterol: 79 mg/dL (ref 0–99)
NonHDL: 103.77
Total CHOL/HDL Ratio: 3
Triglycerides: 125 mg/dL (ref 0.0–149.0)
VLDL: 25 mg/dL (ref 0.0–40.0)

## 2024-01-23 LAB — TSH: TSH: 1.74 u[IU]/mL (ref 0.35–5.50)

## 2024-01-23 LAB — VITAMIN D 25 HYDROXY (VIT D DEFICIENCY, FRACTURES): VITD: 40.07 ng/mL (ref 30.00–100.00)

## 2024-01-23 NOTE — Telephone Encounter (Signed)
 LMOM informing Pt that physical form is completed and at front desk for pick up at her convenience. Copy sent for scanning.

## 2024-01-24 ENCOUNTER — Ambulatory Visit: Payer: Self-pay | Admitting: Internal Medicine

## 2024-02-08 ENCOUNTER — Encounter

## 2024-03-23 ENCOUNTER — Other Ambulatory Visit: Payer: Self-pay | Admitting: Physician Assistant

## 2024-03-25 NOTE — Telephone Encounter (Signed)
 Last Fill: 08/28/2023  Next Visit: 05/01/2024  Last Visit: 12/06/2023  Dx: Sjogren's syndrome with keratoconjunctivitis sicca   Current Dose per office note on 12/06/2023: not discussed  Okay to refill Pilocarpine ?

## 2024-03-28 ENCOUNTER — Ambulatory Visit (INDEPENDENT_AMBULATORY_CARE_PROVIDER_SITE_OTHER)

## 2024-03-28 DIAGNOSIS — M349 Systemic sclerosis, unspecified: Secondary | ICD-10-CM | POA: Diagnosis not present

## 2024-03-28 LAB — PULMONARY FUNCTION TEST
DL/VA % pred: 85 %
DL/VA: 3.76 ml/min/mmHg/L
DLCO unc % pred: 90 %
DLCO unc: 15.8 ml/min/mmHg
FEF 25-75 Post: 3.59 L/s
FEF 25-75 Pre: 2.75 L/s
FEF2575-%Change-Post: 30 %
FEF2575-%Pred-Post: 156 %
FEF2575-%Pred-Pre: 119 %
FEV1-%Change-Post: 6 %
FEV1-%Pred-Post: 95 %
FEV1-%Pred-Pre: 89 %
FEV1-Post: 2.14 L
FEV1-Pre: 2 L
FEV1FVC-%Change-Post: -9 %
FEV1FVC-%Pred-Pre: 115 %
FEV6-%Change-Post: 18 %
FEV6-%Pred-Post: 93 %
FEV6-%Pred-Pre: 78 %
FEV6-Post: 2.59 L
FEV6-Pre: 2.19 L
FEV6FVC-%Pred-Post: 103 %
FEV6FVC-%Pred-Pre: 103 %
FVC-%Change-Post: 18 %
FVC-%Pred-Post: 90 %
FVC-%Pred-Pre: 76 %
FVC-Post: 2.59 L
FVC-Pre: 2.19 L
Post FEV1/FVC ratio: 83 %
Post FEV6/FVC ratio: 100 %
Pre FEV1/FVC ratio: 92 %
Pre FEV6/FVC Ratio: 100 %
RV % pred: 240 %
RV: 3.96 L
TLC % pred: 152 %
TLC: 6.59 L

## 2024-03-28 NOTE — Progress Notes (Signed)
 Full pft performed today

## 2024-03-28 NOTE — Patient Instructions (Signed)
 Full pft performed today

## 2024-04-17 NOTE — Progress Notes (Unsigned)
 "  Office Visit Note  Patient: Cynthia Fox             Date of Birth: 03-Feb-1971           MRN: [id number]             PCP: Amon Aloysius BRAVO, MD Referring: Amon Aloysius BRAVO, MD Visit Date: 05/01/2024 Occupation: Data Unavailable  Subjective:  Medication monitoring   History of Present Illness: Cynthia Fox is a 54 y.o. female with history of systemic sclerosis.  Patient is currently taking plaquenil  200 mg 1 tablet by mouth daily.  She is tolerating Plaquenil  without any side effects and has not had any recent gaps in therapy.  Patient reports that since her last office visit she has had a high-resolution chest CT, PFTs, and echocardiogram.  She is established care with both Dr. Annella and Dr. Delford as encouraged.  She denies any new or worsening pulmonary symptoms.  She denies any reflux, dysphagia, or constipation.  Patient has been walking for exercise and has been trying to lose weight.  She continues to have chronic sicca symptoms.  She has been using Restasis and taking pilocarpine  for symptomatic relief.  Patient states that she has intermittent symptoms of Raynaud's phenomenon but overall her symptoms have been well-controlled during the winter months.  She denies any increase skin tightness or thickening.  She denies any digital ulcerations.  Activities of Daily Living:  Patient reports morning stiffness for 0 minutes.   Patient Denies nocturnal pain.  Difficulty dressing/grooming: Denies Difficulty climbing stairs: Denies Difficulty getting out of chair: Denies Difficulty using hands for taps, buttons, cutlery, and/or writing: Denies  Review of Systems  Constitutional:  Negative for fatigue.  HENT:  Positive for mouth dryness. Negative for mouth sores.   Eyes:  Positive for dryness.  Respiratory:  Negative for shortness of breath.   Cardiovascular:  Negative for chest pain and palpitations.  Gastrointestinal:  Negative for blood in stool, constipation and diarrhea.  Endocrine:  Negative for increased urination.  Genitourinary:  Negative for involuntary urination.  Musculoskeletal:  Positive for myalgias, muscle tenderness and myalgias. Negative for joint pain, gait problem, joint pain, joint swelling, muscle weakness and morning stiffness.  Skin:  Positive for color change and hair loss. Negative for rash and sensitivity to sunlight.  Allergic/Immunologic: Negative for susceptible to infections.  Neurological:  Positive for numbness. Negative for dizziness and headaches.  Hematological:  Negative for swollen glands.  Psychiatric/Behavioral:  Positive for sleep disturbance. Negative for depressed mood. The patient is not nervous/anxious.     PMFS History:  Patient Active Problem List   Diagnosis Date Noted   H. 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   Annual physical exam 10/24/2016   Menopause 10/13/2016   H/O seasonal allergies 03/23/2016   Vitamin D  deficiency 03/23/2016   High risk medication use 03/22/2016   Sjogren's syndrome (HCC) 06/25/2015   PCP NOTES >>>>>>>>>>>>>>>>>>>>>> 06/25/2015   Graves disease 06/01/2015   Hyperprolactinemia 01/12/2015   Elevated antinuclear antibody (ANA) level 07/23/2014    Past Medical History:  Diagnosis Date   Compressive optic neuropathy    Graves disease    Hepatitis A    Seasonal allergies    Sjogren's disease    positive ANA nad Ro   Thyroid  disease    Thyroid  eye disease     Family History  Problem Relation Age of Onset   Hypertension Mother    Retinal detachment Mother  Thyroid  disease Mother    Diabetes Father    Thyroid  disease Sister    Other Sister        tonsil    Heart attack Maternal Grandfather        8   Breast cancer Neg Hx    Colon cancer Neg Hx    Stomach cancer Neg Hx    Pancreatic cancer Neg Hx    Esophageal cancer Neg Hx    Past Surgical History:  Procedure Laterality Date   EYE SURGERY Bilateral 2017, 2018   release of optic  pressure    FUNCTIONAL ENDOSCOPIC SINUS SURGERY Bilateral 05/2015   LIPOMA EXCISION Right 02/24/2023   Procedure: EXCISION LIPOMA RIGHT AXILLA;  Surgeon: Curvin Deward MOULD, MD;  Location: [facility];  Service: General;  Laterality: Right;   Orbital Decompression Bilateral 05/2015   Social History[1] Social History   Social History Narrative   Original from [facility]    Lives w/ husband     Immunization History  Administered Date(s) Administered   Influenza, Seasonal, Injecte, Preservative Fre 01/05/2023, 01/22/2024   Influenza,inj,Quad PF,6+ Mos 12/24/2018   Influenza-Unspecified 12/19/2019, 12/10/2021, 01/05/2023   PFIZER(Purple Top)SARS-COV-2 Vaccination 07/04/2019, 07/25/2019, 03/10/2020   Tdap 07/30/2012, 01/20/2023   Zoster Recombinant(Shingrix) 09/01/2021, 02/15/2022     Objective: Vital Signs: BP 116/79   Pulse 80   Temp 97.9 F (36.6 C)   Resp 15   Ht 5' (1.524 m)   Wt 165 lb 6.4 oz (75 kg)   LMP 05/16/2016   BMI 32.30 kg/m    Physical Exam Vitals and nursing note reviewed.  Constitutional:      Appearance: She is well-developed.  HENT:     Head: Normocephalic and atraumatic.  Eyes:     Conjunctiva/sclera: Conjunctivae normal.  Cardiovascular:     Rate and Rhythm: Normal rate and regular rhythm.     Heart sounds: Normal heart sounds.  Pulmonary:     Effort: Pulmonary effort is normal.     Breath sounds: Normal breath sounds.  Abdominal:     General: Bowel sounds are normal.     Palpations: Abdomen is soft.  Musculoskeletal:     Cervical back: Normal range of motion.  Lymphadenopathy:     Cervical: No cervical adenopathy.  Skin:    General: Skin is warm and dry.     Capillary Refill: Capillary refill takes less than 2 seconds.     Comments: Generalized puffiness of both hands noted.  No signs of sclerodactyly.  No digital ulcerations. Telangectasia's on the palmar aspect of both hands and inner lower lip.   Neurological:     Mental Status:  She is alert and oriented to person, place, and time.  Psychiatric:        Behavior: Behavior normal.      Musculoskeletal Exam: C-spine, thoracic spine, lumbar spine have good range of motion.  No midline spinal tenderness.  No SI joint tenderness.  Shoulder joints, elbow joints, wrist joints, MCPs, PIPs, DIPs have good range of motion with no synovitis.  Complete fist formation bilaterally.  Hip joints have good range of motion with no groin pain.  Knee joints have good range of motion no warmth or effusion.  Ankle joints have good range of motion no tenderness or joint swelling.  No evidence of Achilles tendinitis or plantar fasciitis.   CDAI Exam: CDAI Score: -- Patient Global: --; Provider Global: -- Swollen: --; Tender: -- Joint Exam 05/01/2024   No joint exam has  been documented for this visit   There is currently no information documented on the homunculus. Go to the Rheumatology activity and complete the homunculus joint exam.  Investigation: No additional findings.  Imaging: No results found.  Recent Labs: Lab Results  Component Value Date   WBC 7.2 11/28/2023   HGB 13.5 11/28/2023   PLT 274 11/28/2023   NA 139 11/28/2023   K 4.0 11/28/2023   CL 101 11/28/2023   CO2 26 11/28/2023   GLUCOSE 99 11/28/2023   BUN 17 11/28/2023   CREATININE 1.03 11/28/2023   BILITOT 0.8 11/28/2023   ALKPHOS 139 (H) 01/24/2023   AST 37 (H) 11/28/2023   ALT 48 (H) 11/28/2023   PROT 7.6 11/28/2023   PROT 7.8 11/28/2023   ALBUMIN 4.5 01/24/2023   CALCIUM 9.6 11/28/2023   GFRAA 117 09/16/2020    Speciality Comments: PLQ Eye Exam: 06/13/2023 WNL@ [facility] Follow up in 1 year  Procedures:  No procedures performed Allergies: Patient has no known allergies.       Assessment / Plan:     Visit Diagnoses: Systemic sclerosis (HCC) - ANA 1:40 NS,1:1280 nuclear, centromere, ESR 28, Ro ab+, RF 13, CK 124, SCL 70-, centromere protein a ab+, centromere protein B ab+, RNA polymerase  III-, RNP-:  No progression of symptoms.  No sclerodactyly noted.  No digital ulcerations noted.  She has infrequent symptoms of Raynaud's phenomenon recently.  Discussed the importance of keeping her core body temperature warm and wearing gloves and socks throughout the winter months. Discussed the importance of close blood pressure monitoring due to increased risk for renal cell crisis--blood pressure was 116/79 today in the office. She is not experiencing any symptoms of dysphagia, reflux, or constipation. No new or worsening pulmonary symptoms.  She has been walking for exercise and has been trying to lose weight. She has established care with Dr. Annella and Dr. Delford.  She has had a high-resolution chest CT, PFTs, and echocardiogram to screen for interstitial lung disease and pulmonary hypertension.  High-resolution chest CT on 01/23/2024 did not reveal any findings consistent with ILD.  PFTs were within normal limits. Plan to obtain the following lab work today for further evaluation.  She will notify us  if she develops any new or worsening symptoms.  She will follow-up in the office in 5 months or sooner if needed.  Plan: C3 and C4, Sedimentation rate, Comprehensive metabolic panel with GFR, CBC with Differential/Platelet, Urinalysis, Routine w reflex microscopic, Serum protein electrophoresis with reflex  Telangiectasias: Palms and lower lip.  Sjogren's syndrome with keratoconjunctivitis sicca - Positive ANA, positive Ro, positive rheumatoid factor, negative CCP: She continues to have chronic sicca symptoms.  She has been using Restasis eyedrops and taking pilocarpine  for symptomatic relief.  No parotid swelling or tenderness noted.  No cervical lymphadenopathy noted.  Discussed the importance of seeing the dentist at least biannually.  Also discussed the importance of following up closely with ophthalmology. She is not experiencing any new or worsening pulmonary symptoms.  Discussed the  increased risk for developing interstitial lung disease with a history of systemic sclerosis and Sjogren's.  Patient had a high-resolution chest CT on 01/23/2024 which did not reveal any findings suggestive of interstitial lung disease. No signs of inflammatory arthritis currently.  No synovitis noted.  She will remain on Plaquenil  as prescribed. Plan to obtain the following lab work today for further evaluation.  She will notify us  if she develops any new or worsening symptoms. - Plan:  C3 and C4, Sedimentation rate, Comprehensive metabolic panel with GFR, CBC with Differential/Platelet, Urinalysis, Routine w reflex microscopic, Serum protein electrophoresis with reflex  Raynaud's disease without gangrene: She has intermittent symptoms of Raynaud's phenomenon but overall her symptoms have been well-controlled throughout the winter months.  No digital ulcerations noted.  No signs of sclerodactyly noted.  Discussed the importance of keeping core body temperature warm and wearing gloves and socks throughout the winter months.  She will notify us  if she develops any new or worsening symptoms.  High risk medication use - Plaquenil  200 mg 1 tablet by mouth daily.   CBC and CMP updated on 11/28/2023. Orders for CBC and CMP released today.  PLQ Eye Exam: 06/13/2023 WNL@ [facility] Follow up in 1 year. - Plan: Comprehensive metabolic panel with GFR, CBC with Differential/Platelet  Osteopenia of multiple sites - DEXA updated on 01/20/22:  The BMD measured at Femur Neck Left is 0.825 g/cm2 with a T-score of -1.5.  Patient is taking a vitamin D  and calcium supplement.  Overdue to update a bone density.   History of vitamin D  deficiency: She is taking vitamin D  as recommended.  Primary insomnia: She has been taking melatonin at bedtime for insomnia.  History of Graves' disease  Elevated LFTs: AST and ALT elevated on 11/28/2023.  CMP updated today.  Orders: Orders Placed This Encounter  Procedures   C3  and C4   Sedimentation rate   Comprehensive metabolic panel with GFR   CBC with Differential/Platelet   Urinalysis, Routine w reflex microscopic   Serum protein electrophoresis with reflex   No orders of the defined types were placed in this encounter.     Follow-Up Instructions: Return in about 5 months (around 09/29/2024) for Sjogren's syndrome, systemic sclerosis.   Waddell CHRISTELLA Craze, PA-C  Note - This record has been created using Dragon software.  Chart creation errors have been sought, but may not always  have been located. Such creation errors do not reflect on  the standard of medical care.     [1]  Social History Tobacco Use   Smoking status: Never    Passive exposure: Past   Smokeless tobacco: Never  Vaping Use   Vaping status: Never Used  Substance Use Topics   Alcohol use: Yes    Comment: weekends occ   Drug use: No   "

## 2024-05-01 ENCOUNTER — Ambulatory Visit: Attending: Physician Assistant | Admitting: Physician Assistant

## 2024-05-01 ENCOUNTER — Encounter: Payer: Self-pay | Admitting: Physician Assistant

## 2024-05-01 VITALS — BP 116/79 | HR 80 | Temp 97.9°F | Resp 15 | Ht 60.0 in | Wt 165.4 lb

## 2024-05-01 DIAGNOSIS — Z79899 Other long term (current) drug therapy: Secondary | ICD-10-CM

## 2024-05-01 DIAGNOSIS — M349 Systemic sclerosis, unspecified: Secondary | ICD-10-CM | POA: Diagnosis not present

## 2024-05-01 DIAGNOSIS — I781 Nevus, non-neoplastic: Secondary | ICD-10-CM | POA: Diagnosis not present

## 2024-05-01 DIAGNOSIS — I7300 Raynaud's syndrome without gangrene: Secondary | ICD-10-CM | POA: Diagnosis not present

## 2024-05-01 DIAGNOSIS — F5101 Primary insomnia: Secondary | ICD-10-CM

## 2024-05-01 DIAGNOSIS — M8589 Other specified disorders of bone density and structure, multiple sites: Secondary | ICD-10-CM

## 2024-05-01 DIAGNOSIS — R7989 Other specified abnormal findings of blood chemistry: Secondary | ICD-10-CM

## 2024-05-01 DIAGNOSIS — Z8639 Personal history of other endocrine, nutritional and metabolic disease: Secondary | ICD-10-CM

## 2024-05-01 DIAGNOSIS — M3501 Sicca syndrome with keratoconjunctivitis: Secondary | ICD-10-CM | POA: Diagnosis not present

## 2024-05-02 ENCOUNTER — Ambulatory Visit: Payer: Self-pay | Admitting: Physician Assistant

## 2024-05-02 NOTE — Progress Notes (Signed)
 ALT remains borderline elevated but has improved. AST WNL. Rest of CMP WNL.   ESR WNL-great news!  CBC WNL Trace ketones in urine. No proteinuria.  No casts.

## 2024-05-06 LAB — PROTEIN ELECTROPHORESIS, SERUM, WITH REFLEX
Albumin ELP: 4.6 g/dL (ref 3.8–4.8)
Alpha 1: 0.2 g/dL (ref 0.2–0.3)
Alpha 2: 0.6 g/dL (ref 0.5–0.9)
Beta 2: 0.6 g/dL — ABNORMAL HIGH (ref 0.2–0.5)
Beta Globulin: 0.5 g/dL (ref 0.4–0.6)
Gamma Globulin: 1.1 g/dL (ref 0.8–1.7)
Total Protein: 7.6 g/dL (ref 6.1–8.1)

## 2024-05-06 LAB — SEDIMENTATION RATE: Sed Rate: 17 mm/h (ref 0–30)

## 2024-05-06 LAB — CBC WITH DIFFERENTIAL/PLATELET
Absolute Lymphocytes: 2213 {cells}/uL (ref 850–3900)
Absolute Monocytes: 601 {cells}/uL (ref 200–950)
Basophils Absolute: 37 {cells}/uL (ref 0–200)
Basophils Relative: 0.6 %
Eosinophils Absolute: 291 {cells}/uL (ref 15–500)
Eosinophils Relative: 4.7 %
HCT: 39.6 % (ref 35.9–46.0)
Hemoglobin: 13.3 g/dL (ref 11.7–15.5)
MCH: 33.2 pg — ABNORMAL HIGH (ref 27.0–33.0)
MCHC: 33.6 g/dL (ref 31.6–35.4)
MCV: 98.8 fL (ref 81.4–101.7)
MPV: 11.8 fL (ref 7.5–12.5)
Monocytes Relative: 9.7 %
Neutro Abs: 3057 {cells}/uL (ref 1500–7800)
Neutrophils Relative %: 49.3 %
Platelets: 257 10*3/uL (ref 140–400)
RBC: 4.01 Million/uL (ref 3.80–5.10)
RDW: 12.7 % (ref 11.0–15.0)
Total Lymphocyte: 35.7 %
WBC: 6.2 10*3/uL (ref 3.8–10.8)

## 2024-05-06 LAB — COMPREHENSIVE METABOLIC PANEL WITH GFR
AG Ratio: 1.6 (calc) (ref 1.0–2.5)
ALT: 35 U/L — ABNORMAL HIGH (ref 6–29)
AST: 35 U/L (ref 10–35)
Albumin: 4.9 g/dL (ref 3.6–5.1)
Alkaline phosphatase (APISO): 107 U/L (ref 37–153)
BUN: 14 mg/dL (ref 7–25)
CO2: 30 mmol/L (ref 20–32)
Calcium: 9.5 mg/dL (ref 8.6–10.4)
Chloride: 102 mmol/L (ref 98–110)
Creat: 0.83 mg/dL (ref 0.50–1.03)
Globulin: 3 g/dL (ref 1.9–3.7)
Glucose, Bld: 91 mg/dL (ref 65–99)
Potassium: 4.2 mmol/L (ref 3.5–5.3)
Sodium: 141 mmol/L (ref 135–146)
Total Bilirubin: 0.9 mg/dL (ref 0.2–1.2)
Total Protein: 7.9 g/dL (ref 6.1–8.1)
eGFR: 83 mL/min/{1.73_m2}

## 2024-05-06 LAB — URINALYSIS, ROUTINE W REFLEX MICROSCOPIC
Bilirubin Urine: NEGATIVE
Glucose, UA: NEGATIVE
Hgb urine dipstick: NEGATIVE
Leukocytes,Ua: NEGATIVE
Nitrite: NEGATIVE
Protein, ur: NEGATIVE
Specific Gravity, Urine: 1.014 (ref 1.001–1.035)
pH: 7 (ref 5.0–8.0)

## 2024-05-06 LAB — IFE INTERPRETATION

## 2024-05-06 LAB — C3 AND C4
C3 Complement: 135 mg/dL (ref 83–193)
C4 Complement: 26 mg/dL (ref 15–57)

## 2024-05-06 NOTE — Progress Notes (Signed)
 SPEP revealed findings consistent with acute inflammation.IFE is normal pattern.

## 2024-09-30 ENCOUNTER — Ambulatory Visit: Admitting: Physician Assistant

## 2025-01-22 ENCOUNTER — Encounter: Admitting: Internal Medicine
